# Patient Record
Sex: Male | Born: 1955
Health system: Southern US, Community
[De-identification: ages and names within clinical notes are randomized; demographics above are authoritative.]

## PROBLEM LIST (undated history)

## (undated) DIAGNOSIS — I10 Essential (primary) hypertension: Secondary | ICD-10-CM

## (undated) DIAGNOSIS — K219 Gastro-esophageal reflux disease without esophagitis: Secondary | ICD-10-CM

## (undated) DIAGNOSIS — C801 Malignant (primary) neoplasm, unspecified: Secondary | ICD-10-CM

## (undated) DIAGNOSIS — G43909 Migraine, unspecified, not intractable, without status migrainosus: Secondary | ICD-10-CM

## (undated) DIAGNOSIS — E785 Hyperlipidemia, unspecified: Secondary | ICD-10-CM

## (undated) HISTORY — DX: Gastro-esophageal reflux disease without esophagitis: K21.9

## (undated) HISTORY — PX: MOLE REMOVAL: SHX2046

## (undated) HISTORY — DX: Essential (primary) hypertension: I10

## (undated) HISTORY — DX: Migraine, unspecified, not intractable, without status migrainosus: G43.909

## (undated) HISTORY — DX: Hyperlipidemia, unspecified: E78.5

---

## 1999-09-21 ENCOUNTER — Inpatient Hospital Stay: Admission: EM | Admit: 1999-09-21 | Discharge: 1999-09-23 | Payer: Self-pay | Admitting: Emergency Medicine

## 1999-09-29 ENCOUNTER — Encounter: Admission: RE | Admit: 1999-09-29 | Discharge: 1999-09-29 | Payer: Self-pay | Admitting: Thoracic Surgery

## 1999-09-29 ENCOUNTER — Encounter: Payer: Self-pay | Admitting: Thoracic Surgery

## 1999-10-09 ENCOUNTER — Ambulatory Visit (HOSPITAL_COMMUNITY): Admission: RE | Admit: 1999-10-09 | Discharge: 1999-10-09 | Payer: Self-pay | Admitting: Psychiatry

## 1999-10-28 ENCOUNTER — Encounter: Admission: RE | Admit: 1999-10-28 | Discharge: 1999-10-28 | Payer: Self-pay | Admitting: Thoracic Surgery

## 1999-10-28 ENCOUNTER — Encounter: Payer: Self-pay | Admitting: Thoracic Surgery

## 1999-12-26 ENCOUNTER — Encounter: Admission: RE | Admit: 1999-12-26 | Discharge: 1999-12-26 | Payer: Self-pay | Admitting: Thoracic Surgery

## 1999-12-26 ENCOUNTER — Encounter: Payer: Self-pay | Admitting: Thoracic Surgery

## 2003-03-06 ENCOUNTER — Encounter: Payer: Self-pay | Admitting: Emergency Medicine

## 2003-03-06 ENCOUNTER — Emergency Department (HOSPITAL_COMMUNITY): Admission: EM | Admit: 2003-03-06 | Discharge: 2003-03-07 | Payer: Self-pay | Admitting: Emergency Medicine

## 2007-12-01 DIAGNOSIS — C61 Malignant neoplasm of prostate: Secondary | ICD-10-CM

## 2007-12-01 HISTORY — DX: Malignant neoplasm of prostate: C61

## 2008-11-30 HISTORY — PX: PROSTATECTOMY: SHX69

## 2008-12-19 ENCOUNTER — Encounter (INDEPENDENT_AMBULATORY_CARE_PROVIDER_SITE_OTHER): Payer: Self-pay | Admitting: Urology

## 2008-12-19 ENCOUNTER — Inpatient Hospital Stay (HOSPITAL_COMMUNITY): Admission: RE | Admit: 2008-12-19 | Discharge: 2008-12-20 | Payer: Self-pay | Admitting: Urology

## 2009-05-13 ENCOUNTER — Emergency Department (HOSPITAL_COMMUNITY): Admission: EM | Admit: 2009-05-13 | Discharge: 2009-05-13 | Payer: Self-pay | Admitting: Family Medicine

## 2009-05-18 ENCOUNTER — Emergency Department (HOSPITAL_COMMUNITY): Admission: EM | Admit: 2009-05-18 | Discharge: 2009-05-18 | Payer: Self-pay | Admitting: Family Medicine

## 2009-06-18 ENCOUNTER — Emergency Department (HOSPITAL_COMMUNITY): Admission: EM | Admit: 2009-06-18 | Discharge: 2009-06-18 | Payer: Self-pay | Admitting: Family Medicine

## 2009-10-14 ENCOUNTER — Ambulatory Visit: Payer: Self-pay | Admitting: Occupational Medicine

## 2010-12-30 NOTE — Letter (Signed)
Summary: Out of Work  [facility]  [address]   [place], [place] [address]   Phone: [phone]  Fax: [phone]    October 14, 2009   Employee:  [employer]    To Whom It May Concern:   For Medical reasons, please excuse the above named employee from work for the following dates:  Start:   October 14, 2009  End:   October 16, 2009  If you need additional information, please feel free to contact our office.         Sincerely,    Kathrine Haddock MD

## 2010-12-30 NOTE — Assessment & Plan Note (Signed)
Summary: Fever, headache, cough-dry x 1 day rm 3   Vital Signs:  Patient Profile:   55 Years Old Male CC:      Cold & URI symptoms Height:     72 inches Weight:      191 pounds O2 Sat:      100 % O2 treatment:    Room Air Temp:     99.4 degrees F oral Pulse rate:   114 / minute Pulse rhythm:   regular Resp:     16 per minute BP sitting:   143 / 82  (right arm) Cuff size:   regular  Vitals Entered By: Areta Haber CMA (October 14, 2009 4:50 PM)                  Current Allergies: No known allergies History of Present Illness Chief Complaint: Cold & URI symptoms History of Present Illness: Yesterday he started feeling "achy" all over and today had a fever of 102.   Some associated nausea.  Vomited once.   Complains of dry hacking cough and headache.   No reports of rash.   No abdominal pain.   No sinus congestion or pressure.   No ear pain or sore throat.   He did get the flu shot several weeks ago.  He babysat his grandkids a week ago and they were sick at the time.   Current Problems: INFLUENZA (ICD-487.8) FAMILY HISTORY DIABETES 1ST DEGREE RELATIVE (ICD-V18.0)   Current Meds ASPIRIN 81 MG CHEW (ASPIRIN) 1 tab by mouth once daily CHERATUSSIN AC 100-10 MG/5ML SYRP (GUAIFENESIN-CODEINE) 5 ml every 8 hours as needed for cough  REVIEW OF SYSTEMS Constitutional Symptoms       Complains of fever and fatigue.     Denies chills, night sweats, weight loss, and weight gain.  Eyes       Denies change in vision, eye pain, eye discharge, glasses, contact lenses, and eye surgery. Ear/Nose/Throat/Mouth       Denies hearing loss/aids, change in hearing, ear pain, ear discharge, dizziness, frequent runny nose, frequent nose bleeds, sinus problems, sore throat, hoarseness, and tooth pain or bleeding.  Respiratory       Complains of dry cough.      Denies productive cough, wheezing, shortness of breath, asthma, bronchitis, and emphysema/COPD.  Cardiovascular       Denies murmurs,  chest pain, and tires easily with exhertion.    Gastrointestinal       Complains of nausea/vomiting.      Denies stomach pain, diarrhea, constipation, blood in bowel movements, and indigestion. Genitourniary       Denies painful urination, kidney stones, and loss of urinary control. Neurological       Complains of headaches.      Denies paralysis, seizures, and fainting/blackouts. Musculoskeletal       Complains of muscle pain.      Denies joint pain, joint stiffness, decreased range of motion, redness, swelling, muscle weakness, and gout.  Skin       Denies bruising, unusual mles/lumps or sores, and hair/skin or nail changes.  Psych       Denies mood changes, temper/anger issues, anxiety/stress, speech problems, depression, and sleep problems. Other Comments: x 1 day   Past History:  Past Medical History: Unremarkable  Past Surgical History: Prostatectomy  Family History: Family History Diabetes 1st degree relative Family History of Cardiovascular disorder Emphysema Prostate Cancer  Social History: Married Never Smoked Alcohol use-no Drug use-no Regular exercise-yes Smoking Status:  never Drug Use:  no Does Patient Exercise:  yes Physical Exam General appearance: well developed, well nourished, no acute distress Eyes: conjunctivae and lids normal Pupils: equal, round, reactive to light Ears: normal, no lesions or deformities Nasal: mucosa pink, nonedematous, no septal deviation, turbinates normal Oral/Pharynx: tongue normal, posterior pharynx without erythema or exudate Neck: neck supple,  trachea midline, no masses Chest/Lungs: no rales, wheezes, or rhonchi bilateral, breath sounds equal without effort Heart: regular rate and  rhythm, no murmur Assessment New Problems: INFLUENZA (ICD-487.8) FAMILY HISTORY DIABETES 1ST DEGREE RELATIVE (ICD-V18.0)   Plan New Medications/Changes: CHERATUSSIN AC 100-10 MG/5ML SYRP (GUAIFENESIN-CODEINE) 5 ml every 8 hours as  needed for cough  #4 oz x 0, 10/14/2009, Kathrine Haddock MD  New Orders: New Patient Level II [phone] Planning Comments:   supportive care ibuprofen 600 TId Cheratussin at night for cough Off until Weds.   The patient and/or caregiver has been counseled thoroughly with regard to medications prescribed including dosage, schedule, interactions, rationale for use, and possible side effects and they verbalize understanding.  Diagnoses and expected course of recovery discussed and will return if not improved as expected or if the condition worsens. Patient and/or caregiver verbalized understanding.  Prescriptions: CHERATUSSIN AC 100-10 MG/5ML SYRP (GUAIFENESIN-CODEINE) 5 ml every 8 hours as needed for cough  #4 oz x 0   Entered and Authorized by:   Kathrine Haddock MD   Signed by:   Kathrine Haddock MD on 10/14/2009   Method used:   Print then Give to Patient   RxID:   [id number]

## 2011-03-16 LAB — CBC
HCT: 43.2 % (ref 39.0–52.0)
HCT: 36.9 % — ABNORMAL LOW (ref 39.0–52.0)
Hemoglobin: 14.6 g/dL (ref 13.0–17.0)
Hemoglobin: 12.5 g/dL — ABNORMAL LOW (ref 13.0–17.0)
MCHC: 33.8 g/dL (ref 30.0–36.0)
MCHC: 33.8 g/dL (ref 30.0–36.0)
MCV: 84.7 fL (ref 78.0–100.0)
MCV: 85.4 fL (ref 78.0–100.0)
Platelets: 192 10*3/uL (ref 150–400)
Platelets: 184 10*3/uL (ref 150–400)
RBC: 5.1 MIL/uL (ref 4.22–5.81)
RBC: 4.33 MIL/uL (ref 4.22–5.81)
RDW: 13.2 % (ref 11.5–15.5)
RDW: 13 % (ref 11.5–15.5)
WBC: 6.5 10*3/uL (ref 4.0–10.5)
WBC: 13 10*3/uL — ABNORMAL HIGH (ref 4.0–10.5)

## 2011-03-16 LAB — BASIC METABOLIC PANEL
BUN: 4 mg/dL — ABNORMAL LOW (ref 6–23)
CO2: 28 mEq/L (ref 19–32)
Calcium: 8.4 mg/dL (ref 8.4–10.5)
Chloride: 108 mEq/L (ref 96–112)
Creatinine, Ser: 0.94 mg/dL (ref 0.4–1.5)
GFR calc Af Amer: >60 mL/min (ref >60)
GFR calc non Af Amer: >60 mL/min (ref >60)
Glucose, Bld: 148 mg/dL — ABNORMAL HIGH (ref 70–99)
Potassium: 4.3 mEq/L (ref 3.5–5.1)
Sodium: 139 mEq/L (ref 135–145)

## 2011-03-16 LAB — DIFFERENTIAL
Basophils Absolute: 0.1 10*3/uL (ref 0.0–0.1)
Basophils Relative: 1 % (ref 0–1)
Eosinophils Absolute: 0 10*3/uL (ref 0.0–0.7)
Eosinophils Relative: 0 % (ref 0–5)
Lymphocytes Relative: 10 % — ABNORMAL LOW (ref 12–46)
Lymphs Abs: 1.2 10*3/uL (ref 0.7–4.0)
Monocytes Absolute: 0.9 10*3/uL (ref 0.1–1.0)
Monocytes Relative: 7 % (ref 3–12)
Neutro Abs: 10.7 10*3/uL — ABNORMAL HIGH (ref 1.7–7.7)
Neutrophils Relative %: 83 % — ABNORMAL HIGH (ref 43–77)

## 2011-03-16 LAB — TYPE AND SCREEN
ABO/RH(D): B POS
Antibody Screen: NEGATIVE

## 2011-03-16 LAB — ABO/RH: ABO/RH(D): B POS

## 2011-03-26 ENCOUNTER — Other Ambulatory Visit: Payer: Self-pay | Admitting: Dermatology

## 2011-04-14 NOTE — Op Note (Signed)
NAMEGREGORI, ABRIL              ACCOUNT NO.:  [id number]   MEDICAL RECORD NO.:  [id number]          PATIENT TYPE:  INP   LOCATION:  1406                         FACILITY:  [facility]   PHYSICIAN:  Valetta Fuller, M.D.  DATE OF BIRTH:  1956/03/12   DATE OF PROCEDURE:  12/19/2008  DATE OF DISCHARGE:                               OPERATIVE REPORT   PREOPERATIVE DIAGNOSIS:  Favorable clinical stage T1C adenocarcinoma of  the prostate.   POSTOPERATIVE DIAGNOSIS:  Favorable clinical stage T1C adenocarcinoma of  the prostate.   PROCEDURE PERFORMED:  Robotic-assisted laparoscopic radical retropubic  prostatectomy.   SURGEON:  Valetta Fuller, M.D.   ASSISTANT:  Delia Chimes, NP.   ANESTHESIA:  General endotracheal.   INDICATIONS:  Mr. Mancha is a 55 year old male.  He was sent to see me  several months ago through the courtesy of Dr. Ezzie Dural for  consideration of robotic prostatectomy to manage his recently diagnosed  adenocarcinoma of the prostate.  The patient had a normal PSA of 1.6  with some mild prostatic induration.  Ultrasound showed a small prostate  of approximately 10-15 gm.  Pathology revealed Gleason 6 adenocarcinoma  in 3/12 cores.  The patient's preoperative sexual functioning was fairly  normal with a very low AUA symptom score.  The patient underwent  extensive counseling by Dr. Brunilda Payor as well as myself.  He was interested  in a surgical approach and chose a robotic approach to managing his  prostate cancer.  He appears to understand the advantages,  disadvantages, and potential complications to surgery at this time.  He  presents now for the procedure.  The patient has tended preoperative  classes, has had physical therapy consultation.  He has received  perioperative antibiotics and placement of compression boots along with  a mechanical bowel prep.   OPERATIVE TECHNIQUE/FINDINGS:  Patient was brought to the operating  room.  The proper patient identification  was performed as well as  surgical time-out.  The patient had successful induction of general  endotracheal anesthesia.  He was then placed in a mid lithotomy position  with all extremities carefully padded.  The patient was then placed in a  steep Trendelenburg position and prepped and draped in the usual manner.  A Foley catheter was then inserted sterilely on the field.   The patient's initial camera port incision was chosen, 18 cm above the  pubic symphysis just to the left of the umbilicus.  A standard open  Hasson technique was utilized.  No evidence of abdominal adhesions and  abdominal insufflation with the trocar placement was uneventful.  Although the trocars were placed with direct visual guidance, including  three 8-mm robotic trocars and a 12-mm and a 5-mm assist trocar.  Once  trocars were in position, the surgical cart was docked.  The bladder was  filled to help identify the retropubic space, which was then opened with  electrocautery scissors.  Proper landmarks were identified.  The bladder  was then decompressed.  Superficial fat over the bladder neck and  endopelvic fascia was then removed.  The endopelvic fascia was then  widely incised.  Levator musculature was swept off the apex of the  prostate, isolating that region, and the dorsal vein was then stapled  with an ETS stapling device.  Anterior bladder neck was identified with  the aid of the Foley balloon and transected, utilizing hot  electrocautery scissors.  Catheter was found in the midline and  retracted anteriorly.  Posterior bladder neck was then transected.  Seminal vesicles and vas deferens were found bilaterally, which were  individually dissected free.  The space between the prostate and rectum  was then easily established, primarily with blunt dissection technique.   Attention was then turned towards nerve spare.  Bilaterally, the lateral  fascia was superficially incised and then swept down  posteriorly.  The  plane between the lateral posterior edge of the prostate and  neurovascular bundles were then established and then swept from base to  apex, fully isolating the neurovascular bundles.  The prostate was then  retracted anteriorly, isolating the pedicles, which were taken down with  a series of hematoclips.  The urethra was then transected.  The  prostatic specimen was then removed from the pelvis, which was then  copiously irrigated.  Blood loss was around 100 to 150 cc.  There was no  evidence of rectal injury.   Attention was then turned to reconstruction.  We did not feel lymph node  dissection was necessary in this case.  The posterior bladder neck and  posterior urethra were reapproximated with 1 interrupted 2-0 Vicryl  suture.  The rest of the anastomosis was done with a double-arm 3-0  Monocryl suture in a 360-degree running manner.  A new Coude Foley  catheter was placed, and irrigation revealed the anastomosis to be water-  tight with no evidence of extravasation or leakage.  A pelvic drain was  placed through one of the robotic trocars and sutured to the skin.  The  prostate specimen was placed in the Endopouch retrieval bag.  The 12-mm  trocar site was closed with a Vicryl suture with the aid of a suture  passer.  All of the trocars were taken out with visual guidance.  The  camera port incision was incised slightly to remove the specimen and  then closed with a running #1 Vicryl suture.  All incisions were  infiltrated with Marcaine.  Skin was closed with clips.  The patient  appeared to tolerate the procedure well.  Had no obvious complications  or problems.      Valetta Fuller, M.D.  Electronically Signed     DSG/MEDQ  D:  12/20/2008  T:  12/20/2008  Job:  [id number]

## 2011-06-18 ENCOUNTER — Other Ambulatory Visit (HOSPITAL_COMMUNITY): Payer: Self-pay | Admitting: Family Medicine

## 2011-06-18 DIAGNOSIS — R519 Headache, unspecified: Secondary | ICD-10-CM

## 2011-06-22 ENCOUNTER — Ambulatory Visit (HOSPITAL_COMMUNITY)
Admission: RE | Admit: 2011-06-22 | Discharge: 2011-06-22 | Disposition: A | Discharge status: Home / Self Care | Payer: 59 | Source: Ambulatory Visit | Attending: Family Medicine | Admitting: Family Medicine

## 2011-06-22 ENCOUNTER — Other Ambulatory Visit (HOSPITAL_COMMUNITY): Payer: Self-pay | Admitting: Family Medicine

## 2011-06-22 DIAGNOSIS — J3489 Other specified disorders of nose and nasal sinuses: Secondary | ICD-10-CM | POA: Insufficient documentation

## 2011-06-22 DIAGNOSIS — R519 Headache, unspecified: Secondary | ICD-10-CM

## 2011-06-22 DIAGNOSIS — R51 Headache: Secondary | ICD-10-CM | POA: Insufficient documentation

## 2011-10-18 ENCOUNTER — Emergency Department
Admission: EM | Admit: 2011-10-18 | Discharge: 2011-10-18 | Disposition: A | Discharge status: Home / Self Care | Payer: 59 | Source: Home / Self Care | Attending: Emergency Medicine | Admitting: Emergency Medicine

## 2011-10-18 DIAGNOSIS — J069 Acute upper respiratory infection, unspecified: Secondary | ICD-10-CM

## 2011-10-18 DIAGNOSIS — R059 Cough, unspecified: Secondary | ICD-10-CM

## 2011-10-18 DIAGNOSIS — R509 Fever, unspecified: Secondary | ICD-10-CM

## 2011-10-18 DIAGNOSIS — R05 Cough: Secondary | ICD-10-CM

## 2011-10-18 HISTORY — DX: Malignant (primary) neoplasm, unspecified: C80.1

## 2011-10-18 LAB — POCT RAPID STREP A (OFFICE): Rapid Strep A Screen: NEGATIVE

## 2011-10-18 MED ORDER — AZITHROMYCIN 250 MG PO TABS: ORAL_TABLET | ORAL | Status: AC

## 2011-10-18 MED ORDER — HYDROCODONE-HOMATROPINE 5-1.5 MG/5ML PO SYRP: 5.0000 mL | ORAL_SOLUTION | Freq: Four times a day (QID) | ORAL | Status: AC | PRN

## 2011-10-18 NOTE — ED Provider Notes (Addendum)
History     CSN: [id number] Arrival date & time: 10/18/2011  4:52 PM   First MD Initiated Contact with Patient 10/18/11 1709      Chief Complaint  Patient presents with  . Fever  . Generalized Body Aches    (Consider location/radiation/quality/duration/timing/severity/associated sxs/prior treatment) HPI Benjamin Robbins is a 55 y.o. male who complains of onset of cold symptoms for 2-3 days.  he is using Ibuprofen which helps a little bit. No sore throat + cough No wheezing No nasal congestion No post-nasal drainage No sinus pain/pressure No chest congestion No itchy/red eyes No earache No hemoptysis No SOB + chills/sweats + fever No nausea No vomiting No abdominal pain No diarrhea No skin rashes + fatigue + myalgias + headache    Past Medical History  Diagnosis Date  . Cancer - prostate     Past Surgical History  Procedure Date  . Prostate surgery 2010    Family History  Problem Relation Age of Onset  . Cancer Mother   . Emphysema Father     History  Substance Use Topics  . Smoking status: Never Smoker   . Smokeless tobacco: Not on file  . Alcohol Use: No      Review of Systems  Allergies  Review of patient's allergies indicates no known allergies.  Home Medications  No current outpatient prescriptions on file.  BP 158/90  Pulse 120  Temp(Src) 102.8 F (39.3 C) (Oral)  Resp 24  Ht 6' (1.829 m)  Wt 173 lb 8 oz (78.699 kg)  BMI 23.53 kg/m2  SpO2 99%  Physical Exam  Nursing note and vitals reviewed. Constitutional: He is oriented to person, place, and time. He appears well-developed and well-nourished.  HENT:  Head: Normocephalic and atraumatic.  Right Ear: Tympanic membrane, external ear and ear canal normal.  Left Ear: Tympanic membrane, external ear and ear canal normal.  Nose: Mucosal edema and rhinorrhea present.  Mouth/Throat: Posterior oropharyngeal erythema present. No oropharyngeal exudate or posterior oropharyngeal edema.  Neck:  Neck supple.  Cardiovascular: Regular rhythm and normal heart sounds.   Pulmonary/Chest: Effort normal and breath sounds normal. No respiratory distress.  Neurological: He is alert and oriented to person, place, and time.  Skin: Skin is warm and dry.  Psychiatric: He has a normal mood and affect. His speech is normal.    ED Course  Procedures (including critical care time)  Labs Reviewed - No data to display No results found.   No diagnosis found.    MDM   1)  Take the prescribed antibiotic as instructed.  Rapid strep negative.  Consider CXR if not improving to rule out pneumonia. 2)  Use nasal saline solution (over the counter) at least 3 times a day. 3)  Use over the counter decongestants like Zyrtec-D every 12 hours as needed to help with congestion.  If you have hypertension, do not take medicines with sudafed.  4)  Can take tylenol every 6 hours or motrin every 8 hours for pain or fever. 5)  Follow up with your primary doctor if no improvement in 5-7 days, sooner if increasing pain, fever, or new symptoms.     Lily Kocher, MD 10/18/11 1711  Lily Kocher, MD 10/18/11 [phone]

## 2011-10-18 NOTE — ED Notes (Signed)
Fever and HA- states he is unable to lay on left side d/t pressure in chest

## 2011-10-27 ENCOUNTER — Emergency Department
Admission: EM | Admit: 2011-10-27 | Discharge: 2011-10-27 | Disposition: A | Discharge status: Home / Self Care | Payer: 59 | Source: Home / Self Care | Attending: Emergency Medicine | Admitting: Emergency Medicine

## 2011-10-27 ENCOUNTER — Encounter: Payer: Self-pay | Admitting: *Deleted

## 2011-10-27 DIAGNOSIS — J069 Acute upper respiratory infection, unspecified: Secondary | ICD-10-CM

## 2011-10-27 DIAGNOSIS — M545 Low back pain, unspecified: Secondary | ICD-10-CM

## 2011-10-27 MED ORDER — AZITHROMYCIN 250 MG PO TABS: ORAL_TABLET | ORAL | Status: AC

## 2011-10-27 MED ORDER — PREDNISONE (PAK) 10 MG PO TABS: 10.0000 mg | ORAL_TABLET | Freq: Every day | ORAL | Status: AC

## 2011-10-27 NOTE — ED Provider Notes (Signed)
History     CSN: [id number] Arrival date & time: 10/27/2011 11:20 AM   First MD Initiated Contact with Patient 10/18/11 1709      Chief Complaint  Patient presents with  . Generalized Body Aches    (Consider location/radiation/quality/duration/timing/severity/associated sxs/prior treatment) HPI  this is a 55 year old male who comes back today for similar symptoms as last visit. Please see his previous note which is underneath this one. He did improve substantially on a Z-Pak and feels much better today with much less cough, however he reports over the last day or 2 he has been feeling a little bit febrile with an achy back. He does want to make sure that he is not to get worse again.  Alek is a 55 y.o. male who complains of onset of cold symptoms for 2-3 days.  he is using Ibuprofen which helps a little bit. No sore throat + cough No wheezing No nasal congestion No post-nasal drainage No sinus pain/pressure No chest congestion No itchy/red eyes No earache No hemoptysis No SOB + chills/sweats + fever No nausea No vomiting No abdominal pain No diarrhea No skin rashes + fatigue + myalgias + headache    Past Medical History  Diagnosis Date  . Cancer - prostate     Past Surgical History  Procedure Date  . Prostate surgery 2010    Family History  Problem Relation Age of Onset  . Cancer Mother   . Emphysema Father     History  Substance Use Topics  . Smoking status: Never Smoker   . Smokeless tobacco: Not on file  . Alcohol Use: No      Review of Systems   Allergies  Review of patient's allergies indicates no known allergies.  Home Medications   Current Outpatient Rx  Name Route Sig Dispense Refill  . AZITHROMYCIN 250 MG PO TABS  Use as directed 1 each 0  . HYDROCODONE-HOMATROPINE 5-1.5 MG/5ML PO SYRP Oral Take 5 mLs by mouth every 6 (six) hours as needed for cough. 120 mL 0  . PREDNISONE (PAK) 10 MG PO TABS Oral Take 1 tablet (10 mg total) by  mouth daily. 6 day pack, use as directed 1 tablet 0    BP 144/84  Pulse 90  Temp(Src) 98.5 F (36.9 C) (Oral)  Resp 16  Ht 6' (1.829 m)  Wt 170 lb (77.111 kg)  BMI 23.06 kg/m2  SpO2 100%  Physical Exam  Nursing note and vitals reviewed. Constitutional: He is oriented to person, place, and time. He appears well-developed and well-nourished.  HENT:  Head: Normocephalic and atraumatic.  Right Ear: Tympanic membrane, external ear and ear canal normal.  Left Ear: Tympanic membrane, external ear and ear canal normal.  Nose: Mucosal edema and rhinorrhea present.  Mouth/Throat: Posterior oropharyngeal erythema present. No oropharyngeal exudate or posterior oropharyngeal edema.  Neck: Neck supple.  Cardiovascular: Regular rhythm and normal heart sounds.   Pulmonary/Chest: Effort normal and breath sounds normal. No respiratory distress.  Musculoskeletal:       Lumbar back: He exhibits pain and spasm. He exhibits normal range of motion and no bony tenderness.  Neurological: He is alert and oriented to person, place, and time.  Skin: Skin is warm and dry.  Psychiatric: He has a normal mood and affect. His speech is normal.    ED Course  Procedures  (including critical care time)  Labs Reviewed - No data to display No results found.   1. Acute upper respiratory infections of unspecified  site   2. Pain in lower back      A MDM  As per the previous note, I believe this is still viral. The lung exam still normal and do not believe a chest x-ray is appropriate at this time. However with the lower back pain and spasms along with the continued upper respiratory symptoms, I believe that the prednisone pack would best suit his symptoms. I have also given him another prescription for Z-Pak just in case he is not improved in the next few days. The patient agrees and understands his instructions. See underneath for his previous patient instructions.   1)  Take the prescribed antibiotic as  instructed.  Rapid strep negative.  Consider CXR if not improving to rule out pneumonia. 2)  Use nasal saline solution (over the counter) at least 3 times a day. 3)  Use over the counter decongestants like Zyrtec-D every 12 hours as needed to help with congestion.  If you have hypertension, do not take medicines with sudafed.  4)  Can take tylenol every 6 hours or motrin every 8 hours for pain or fever. 5)  Follow up with your primary doctor if no improvement in 5-7 days, sooner if increasing pain, fever, or new symptoms.     Lily Kocher, MD 10/18/11 1711   Lily Kocher, MD 10/27/11 1149

## 2011-10-27 NOTE — ED Notes (Signed)
Patient was treated here in [facility] about 1 week ago. He finished the Z-pak and improved. He developed fever and body aches again about 2 days ago.

## 2012-11-30 HISTORY — PX: COLONOSCOPY: SHX174

## 2012-12-01 ENCOUNTER — Encounter: Payer: Self-pay | Admitting: Gastroenterology

## 2012-12-16 ENCOUNTER — Ambulatory Visit: Payer: 59 | Admitting: Gastroenterology

## 2012-12-22 ENCOUNTER — Encounter: Payer: Self-pay | Admitting: Gastroenterology

## 2012-12-22 ENCOUNTER — Ambulatory Visit (INDEPENDENT_AMBULATORY_CARE_PROVIDER_SITE_OTHER): Payer: 59 | Admitting: Gastroenterology

## 2012-12-22 ENCOUNTER — Other Ambulatory Visit (INDEPENDENT_AMBULATORY_CARE_PROVIDER_SITE_OTHER): Payer: 59

## 2012-12-22 VITALS — BP 158/82 | HR 76 | Ht 72.0 in | Wt 166.0 lb

## 2012-12-22 DIAGNOSIS — K219 Gastro-esophageal reflux disease without esophagitis: Secondary | ICD-10-CM

## 2012-12-22 DIAGNOSIS — R1032 Left lower quadrant pain: Secondary | ICD-10-CM

## 2012-12-22 DIAGNOSIS — K529 Noninfective gastroenteritis and colitis, unspecified: Secondary | ICD-10-CM

## 2012-12-22 DIAGNOSIS — R197 Diarrhea, unspecified: Secondary | ICD-10-CM

## 2012-12-22 LAB — SEDIMENTATION RATE: Sed Rate: 6 mm/hr (ref 0–22)

## 2012-12-22 LAB — C-REACTIVE PROTEIN: CRP: 0.6 mg/dL (ref 0.5–20.0)

## 2012-12-22 MED ORDER — MESALAMINE 1.2 G PO TBEC: 1.2000 g | DELAYED_RELEASE_TABLET | Freq: Two times a day (BID) | ORAL | Status: DC

## 2012-12-22 MED ORDER — MESALAMINE 1.2 G PO TBEC: 1.2000 g | DELAYED_RELEASE_TABLET | Freq: Every day | ORAL | Status: DC

## 2012-12-22 MED ORDER — MOVIPREP 100 G PO SOLR: 1.0000 | Freq: Once | ORAL | Status: DC

## 2012-12-22 NOTE — Patient Instructions (Addendum)
You have been scheduled for a colonoscopy and endoscopy with propofol. Please follow written instructions given to you at your visit today.  Please pick up your prep kit at the pharmacy within the next 1-3 days. If you use inhalers (even only as needed) or a CPAP machine, please bring them with you on the day of your procedure.  Your physician has requested that you go to the basement for lab work before leaving today  Please start Lialda one tablet by mouth once daily. Samples given today.  Low Fiber Diet information is below  ______________________________________________________________________________________________________________________  Low-Fiber Diet Fiber is found in fruits, vegetables, and grains. A low-fiber diet restricts fibrous foods that are not digested in the small intestine. A diet containing about 10 grams of fiber is considered low fiber.  PURPOSE  To prevent blockage of a partially obstructed or narrowed gastrointestinal tract.  To reduce fecal weight and volume.  To slow the movement of feces. WHEN IS THIS DIET USED?  It may be used during the acute phase of Crohn disease, ulcerative colitis, regional enteritis, or diverticulitis.  It may be used if your intestinal or esophageal tubes are narrowing (stenosis).  It may be used as a transitional diet following surgery, injury (trauma), or illness. CHOOSING FOODS Check labels, especially on foods from the starch list. Often times, dietary fiber content is listed on the nutrition facts panel. Please ask your Registered Dietitian if you have questions about specific foods that are related to your condition, especially if the food is not listed on this handout. Breads and Starches  Allowed: White, [place], and pita breads, plain rolls, buns, or sweet rolls, doughnuts, waffles, pancakes, bagels. Plain muffins, biscuits, matzoth. Soda, saltine, graham crackers. Pretzels, rusks, melba toast, zwieback. Cooked cereals:  cornmeal, farina, or cream cereals. Dry cereals: refined corn, wheat, rice, and oat cereals (check label). Potatoes prepared any way without skins, refined macaroni, spaghetti, noodles, refined rice.  Avoid: Whole-wheat bread, rolls, and crackers. Multigrains, rye, bran seeds, nuts, or coconut. Cereals containing whole grains, multigrains, bran, coconut, nuts, raisins. Cooked or dry oatmeal. Coarse wheat cereals, granola. Cereals advertised as "high fiber." Potato skins. Whole-grain pasta, wild or brown rice. Popcorn. Vegetables  Allowed: Strained tomato and vegetable juices. Fresh lettuce, cucumber, spinach. Well-cooked or canned: asparagus, bean sprouts, broccoli, cut green beans, cauliflower, pumpkin, beets, mushrooms, olives, yellow squash, tomato, tomato sauce, zucchini, turnips.Keep servings limited to  cup.  Avoid: Fresh, cooked, or canned: artichokes, baked beans, beet greens, Brussels sprouts, corn, kale, legumes, peas, sweet potatoes. Avoid large servings of any vegetables. Fruit  Allowed: All fruit juices except prune juice. Cooked or canned fruits without skin and seeds: apricots, applesauce, cantaloupe, cherries, grapefruit, grapes, kiwi, mandarin oranges, peaches, pears, fruit cocktail, pineapple, plums, watermelon. Fresh without skin: banana, grapes, cantaloupe, avocado, cherries, pineapple, kiwi, nectarines, peaches, blueberries. Keep servings limited to  cup or 1 piece.  Avoid: Fresh: apples with or without skin, apricots, mangoes, pears, raspberries, strawberries. Prune juice and juices with pulp, stewed or dried prunes. Dried fruits, raisins, dates. Avoid large servings of all fresh fruits. Meat and Protein Substitutes  Allowed: Ground or well-cooked tender beef, ham, veal, lamb, pork, poultry. Eggs, plain cheese. Fish, oysters, shrimp, lobster, other seafood. Liver, organ meats. Smooth nut butters.  Avoid: Tough, fibrous meats with gristle. Chunky nut butter.Cheese with  seeds, nuts, or other foods not allowed. Nuts, seeds, legumes, dried peas, beans, lentils. Dairy  Allowed: All milk products except those not allowed.  Avoid: Yogurt or  cheese that contains nuts, seeds, or added fruit. Soups and Combination Foods  Allowed: Bouillon, broth, or cream soups made from allowed foods. Any strained soup. Casseroles or mixed dishes made with allowed foods.  Avoid: Soups made from vegetables that are not allowed or that contain other foods not allowed. Desserts and Sweets  Allowed:Plain cakes and cookies, pie made with allowed fruit, pudding, custard, cream pie. Gelatin, fruit, ice, sherbet, frozen ice pops. Ice cream, ice milk without nuts. Plain hard candy, honey, jelly, molasses, syrup, sugar, chocolate syrup, gumdrops, marshmallows.  Avoid: Desserts, cookies, or candies that contain nuts, peanut butter, dried fruits. Jams, preserves with seeds, marmalade. Fats and Oils  Allowed:Margarine, butter, cream, mayonnaise, salad oils, plain salad dressings made from allowed foods.  Avoid: Seeds, nuts, olives. Beverages  Allowed: All, except those listed to avoid.  Avoid: Fruit juices with high pulp, prune juice. Condiments  Allowed:Ketchup, mustard, horseradish, vinegar, cream sauce, cheese sauce, cocoa powder. Spices in moderation: allspice, basil, bay leaves, celery powder or leaves, cinnamon, cumin powder, curry powder, ginger, mace, marjoram, onion or garlic powder, oregano, paprika, parsley flakes, ground pepper, rosemary, sage, savory, tarragon, thyme, turmeric.  Avoid: Coconut, pickles. SAMPLE MENU Breakfast   cup orange juice.  1 boiled egg.  1 slice white toast.  Margarine.   cup cornflakes.  1 cup milk.  Beverage. Lunch   cup chicken noodle soup.  2 to 3 oz sliced roast beef.  2 slices white bread.  Mayonnaise.   cup tomato juice.  1 small banana.  Beverage. Dinner  3 oz baked chicken.   cup scalloped  potatoes.   cup cooked beets.  White dinner roll.  Margarine.   cup canned peaches.  Beverage. Document Released: 05/08/2002 Document Revised: 02/08/2012 Document Reviewed: 12/03/2011 [facility] Patient Information 2013 [place], [place].

## 2012-12-22 NOTE — Progress Notes (Signed)
History of Present Illness:  This is a 57 year old Caucasian male who has had 3 months of left lower quadrant discomfort and soft liquidy stools without melena or hematochezia.  He is been worked up by Dr. Duane Lope and had negative stool exam for Giardia and routine culture.  Patient is been on Nexium for several years because of GERD, discontinued this medication with no improvement in his frequent  stooling.  Denies nausea vomiting, upper GI or hepatobiliary complaints.  He does not abuse cigarettes, NSAIDs, oral nonabsorbable carbohydrates.  No associated symptoms of malabsorption, anorexia, weight loss, or systemic complaints, fever, chills, arthritis, skin rashes, or mouth sores.  Had a negative colonoscopy in 2006 by Dr. Laural Benes as a screening exam.  Family history is entirely noncontributory.  He does have a dog at home.  He denies frequent antibiotic use.  I have reviewed this patient's present history, medical and surgical past history, allergies and medications.     ROS:   All systems were reviewed and are negative unless otherwise stated in the HPI.     No Known Allergies Outpatient Prescriptions Prior to Visit  Medication Sig Dispense Refill  . aspirin 81 MG tablet Take 81 mg by mouth daily.      [name] esomeprazole (NEXIUM) 40 MG capsule Take 40 mg by mouth daily before breakfast.      . Multiple Vitamin (MULTIVITAMINS PO) Take 1 tablet by mouth daily.      [name] topiramate (TOPAMAX) 100 MG tablet Take 100 mg by mouth at bedtime.       Last reviewed on 12/22/2012  9:27 AM by Mardella Layman, MD Past Medical History  Diagnosis Date  . Cancer - prostate   . GERD (gastroesophageal reflux disease)   . Migraine    Past Surgical History  Procedure Date  . Prostatectomy 11-2008  . Mole removal     off neck   History   Social History  . Marital Status: Married    Spouse Name: [name]    Number of Children: 3  . Years of Education: [name]   Occupational History  .  [place]   Social  History Main Topics  . Smoking status: Never Smoker   . Smokeless tobacco: Never Used  . Alcohol Use: No  . Drug Use: No  . Sexually Active: None   Other Topics Concern  . None   Social History Narrative   Daily caffeine   Family History  Problem Relation Age of Onset  . Pancreatic cancer Mother   . Emphysema Father   . Colon cancer Neg Hx   . Colon polyps Brother   . Diabetes Mother        Physical Exam: Blood pressure 150/82, pulse 76 and regular, and weight 166 with a BMI of 22.51. General well developed well nourished patient in no acute distress, appearing their stated age Eyes PERRLA, no icterus, fundoscopic exam per opthamologist Skin no lesions noted Neck supple, no adenopathy, no thyroid enlargement, no tenderness Chest clear to percussion and auscultation Heart no significant murmurs, gallops or rubs noted Abdomen no hepatosplenomegaly masses or tenderness, BS normal.  Rectal inspection normal no fissures, or fistulae noted.  No masses or tenderness on digital exam. Stool guaiac negative. Extremities no acute joint lesions, edema, phlebitis or evidence of cellulitis. Neurologic patient oriented x 3, cranial nerves intact, no focal neurologic deficits noted. Psychological mental status normal and normal affect.  Assessment and plan: His left lower quadrant discomfort and diarrhea certainly  seem consistent with idiopathic colitis, and I have started him on Lialda 2.4 g a day.  I will perform colonoscopy, check stool for WBC, repeat O&P, C. difficile toxin.  I will also check sedimentation rate, CRP, do celiac profile.  Have asked him to stop any food products with sorbitol or other nonabsorbable carbohydrates , and advised low fiber diet.  Review of his labs shows a normal CBC and metabolic profile.  He also needs screening endoscopy for Barrett's mucosa because of his chronic acid reflux and chronic PPI therapy.  We'll obtain small bowel biopsy at the time of his upper  endoscopic exam.  His continue other medications as listed and reviewed.  Please copy his primary care physician.  No diagnosis found.

## 2012-12-23 ENCOUNTER — Telehealth: Payer: Self-pay | Admitting: Gastroenterology

## 2012-12-23 LAB — CELIAC PANEL 10
Endomysial Screen: NEGATIVE
Gliadin IgA: 7.6 U/mL (ref <20)
Gliadin IgG: 11.7 U/mL (ref <20)
IgA: 150 mg/dL (ref 68–379)
Tissue Transglut Ab: 9.7 U/mL (ref <20)
Tissue Transglutaminase Ab, IgA: 4.4 U/mL (ref <20)

## 2012-12-23 NOTE — Telephone Encounter (Signed)
Forward  14 pages from [facility] to Dr. Sheryn Bison for review on 12-23-12 ym

## 2013-01-09 ENCOUNTER — Encounter: Payer: Self-pay | Admitting: Gastroenterology

## 2013-01-09 ENCOUNTER — Ambulatory Visit (AMBULATORY_SURGERY_CENTER): Payer: 59 | Admitting: Gastroenterology

## 2013-01-09 VITALS — BP 144/85 | HR 61 | Temp 97.1°F | Resp 15 | Ht 72.0 in | Wt 166.0 lb

## 2013-01-09 DIAGNOSIS — K219 Gastro-esophageal reflux disease without esophagitis: Secondary | ICD-10-CM

## 2013-01-09 DIAGNOSIS — Z1210 Encounter for screening for malignant neoplasm of intestinal tract, unspecified: Secondary | ICD-10-CM

## 2013-01-09 DIAGNOSIS — Z1211 Encounter for screening for malignant neoplasm of colon: Secondary | ICD-10-CM

## 2013-01-09 DIAGNOSIS — D126 Benign neoplasm of colon, unspecified: Secondary | ICD-10-CM

## 2013-01-09 DIAGNOSIS — D1330 Benign neoplasm of unspecified part of small intestine: Secondary | ICD-10-CM

## 2013-01-09 DIAGNOSIS — R197 Diarrhea, unspecified: Secondary | ICD-10-CM

## 2013-01-09 DIAGNOSIS — R1032 Left lower quadrant pain: Secondary | ICD-10-CM

## 2013-01-09 MED ORDER — SODIUM CHLORIDE 0.9 % IV SOLN: 500.0000 mL | INTRAVENOUS | Status: DC

## 2013-01-09 MED ORDER — HYOSCYAMINE SULFATE 0.125 MG SL SUBL: 0.1250 mg | SUBLINGUAL_TABLET | SUBLINGUAL | Status: DC | PRN

## 2013-01-09 NOTE — Op Note (Signed)
[facility] [address] [place], [address]   COLONOSCOPY PROCEDURE REPORT  PATIENT: Benjamin Robbins, Benjamin Robbins  MR#: [id number] BIRTHDATE: 1956-05-23 , 56  yrs. old GENDER: Male ENDOSCOPIST: Mardella Layman, MD, [facility] REFERRED BY: PROCEDURE DATE:  01/09/2013 PROCEDURE:   Colonoscopy with biopsy ASA CLASS:   Class II INDICATIONS:Average risk patient for colon cancer and unexplained diarrhea. MEDICATIONS: Propofol (Diprivan) 180 mg IV  DESCRIPTION OF PROCEDURE:   After the risks and benefits and of the procedure were explained, informed consent was obtained.  A digital rectal exam revealed no abnormalities of the rectum.    The LB CF-H180AL [id number]  endoscope was introduced through the anus and advanced to the cecum, which was identified by both the appendix and ileocecal valve .  The quality of the prep was excellent, using MoviPrep .  The instrument was then slowly withdrawn as the colon was fully examined.     COLON FINDINGS: A normal appearing cecum, ileocecal valve, and appendiceal orifice were identified.  The ascending, hepatic flexure, transverse, splenic flexure, descending, sigmoid colon and rectum appeared unremarkable.  No polyps or cancers were seen. random biopsies done.   Retroflexed views revealed no abnormalities.     The scope was then withdrawn from the patient and the procedure completed.  COMPLICATIONS: There were no complications. ENDOSCOPIC IMPRESSION: Normal colon ...r/o microscopic colitis/collagenous colitis.Marland KitchenMarland KitchenNO POLYPS NOTED,  RECOMMENDATIONS: 1.  Await biopsy results 2.  Continue current colorectal screening recommendations for "routine risk" patients with a repeat colonoscopy in 10 years. 3.  Upper endoscopy will be scheduled   REPEAT EXAM:  cc:  _______________________________ eSignedMardella Layman, MD, [facility] 01/09/2013 1:52 PM

## 2013-01-09 NOTE — Patient Instructions (Addendum)

## 2013-01-09 NOTE — Progress Notes (Signed)
Called to room to assist during endoscopic procedure.  Patient ID and intended procedure confirmed with present staff. Received instructions for my participation in the procedure from the performing physician.  

## 2013-01-09 NOTE — Op Note (Signed)
[facility] [address] [place], [address]   ENDOSCOPY PROCEDURE REPORT  PATIENT: Benjamin, Robbins  MR#: [id number] BIRTHDATE: 08/02/1956 , 56  yrs. old GENDER: Male ENDOSCOPIST:David Hale Bogus, MD, [facility] REFERRED BY: Duane Lope, M.D. PROCEDURE DATE:  01/09/2013 PROCEDURE:   EGD w/ biopsy ASA CLASS:    Class II INDICATIONS: Chest pain and History of esophageal reflux. MEDICATION: There was residual sedation effect present from prior procedure, Propofol (Diprivan) 140 mg IV, and Robinul 0.2 mg IV TOPICAL ANESTHETIC:   Cetacaine Spray  DESCRIPTION OF PROCEDURE:   After the risks and benefits of the procedure were explained, informed consent was obtained.  The LB GIF-H180 [id number]  endoscope was introduced through the mouth  and advanced to the second portion of the duodenum .  The instrument was slowly withdrawn as the mucosa was fully examined.    The upper, middle and distal third of the esophagus were carefully inspected and no abnormalities were noted.  The z-line was well seen at the GEJ.  The endoscope was pushed into the fundus which was normal including a retroflexed view.  The antrum, gastric body, first and second part of the duodenum were unremarkable.  Multiple biopsies were performed.of the duodenum.    Retroflexed views revealed no abnormalities.    The scope was then withdrawn from the patient and the procedure completed  COMPLICATIONS: There were no complications.   ENDOSCOPIC IMPRESSION:  Hx. of chronic GERD,,,,r/o celiac disease... Normal EGD; multiple biopsies  RECOMMENDATIONS: 1.  Await pathology results 2.  Continue PPI    _______________________________ eSigned:  Mardella Layman, MD, [facility] 01/09/2013 2:04 PM      PATIENT NAME:  Benjamin, Robbins MR#: [id number]

## 2013-01-09 NOTE — Progress Notes (Signed)
Patient did not have preoperative order for IV antibiotic SSI prophylaxis. (G8918)  Patient did not experience any of the following events: a burn prior to discharge; a fall within the facility; wrong site/side/patient/procedure/implant event; or a hospital transfer or hospital admission upon discharge from the facility. (G8907)  

## 2013-01-10 ENCOUNTER — Telehealth: Payer: Self-pay | Admitting: *Deleted

## 2013-01-10 NOTE — Telephone Encounter (Signed)
Number identifier, left message, follow-up  

## 2013-01-11 ENCOUNTER — Telehealth: Payer: Self-pay | Admitting: Gastroenterology

## 2013-01-11 NOTE — Telephone Encounter (Signed)
Pt asked if he continues the Lialda since Dr Jarold Motto ordered levsin. Explained to pt that he could take both together; he stated understanding.

## 2013-01-19 ENCOUNTER — Encounter: Payer: Self-pay | Admitting: Gastroenterology

## 2013-01-24 ENCOUNTER — Encounter: Payer: Self-pay | Admitting: Gastroenterology

## 2013-01-24 ENCOUNTER — Other Ambulatory Visit (INDEPENDENT_AMBULATORY_CARE_PROVIDER_SITE_OTHER): Payer: 59

## 2013-01-24 ENCOUNTER — Ambulatory Visit (INDEPENDENT_AMBULATORY_CARE_PROVIDER_SITE_OTHER): Payer: 59 | Admitting: Gastroenterology

## 2013-01-24 VITALS — BP 132/80 | HR 77 | Ht 72.0 in | Wt 159.4 lb

## 2013-01-24 DIAGNOSIS — R197 Diarrhea, unspecified: Secondary | ICD-10-CM

## 2013-01-24 DIAGNOSIS — K589 Irritable bowel syndrome without diarrhea: Secondary | ICD-10-CM

## 2013-01-24 DIAGNOSIS — R109 Unspecified abdominal pain: Secondary | ICD-10-CM

## 2013-01-24 DIAGNOSIS — R634 Abnormal weight loss: Secondary | ICD-10-CM

## 2013-01-24 LAB — CBC WITH DIFFERENTIAL/PLATELET
Basophils Absolute: 0 10*3/uL (ref 0.0–0.1)
Basophils Relative: 0.4 % (ref 0.0–3.0)
Eosinophils Absolute: 0.2 10*3/uL (ref 0.0–0.7)
Eosinophils Relative: 2.7 % (ref 0.0–5.0)
HCT: 36.8 % — ABNORMAL LOW (ref 39.0–52.0)
Hemoglobin: 12.4 g/dL — ABNORMAL LOW (ref 13.0–17.0)
Lymphocytes Relative: 27.7 % (ref 12.0–46.0)
Lymphs Abs: 1.9 10*3/uL (ref 0.7–4.0)
MCHC: 33.6 g/dL (ref 30.0–36.0)
MCV: 80.6 fl (ref 78.0–100.0)
Monocytes Absolute: 0.4 10*3/uL (ref 0.1–1.0)
Monocytes Relative: 6.6 % (ref 3.0–12.0)
Neutro Abs: 4.2 10*3/uL (ref 1.4–7.7)
Neutrophils Relative %: 62.6 % (ref 43.0–77.0)
Platelets: 248 10*3/uL (ref 150.0–400.0)
RBC: 4.57 Mil/uL (ref 4.22–5.81)
RDW: 13.9 % (ref 11.5–14.6)
WBC: 6.7 10*3/uL (ref 4.5–10.5)

## 2013-01-24 LAB — COMPREHENSIVE METABOLIC PANEL
ALT: 20 U/L (ref 0–53)
AST: 19 U/L (ref 0–37)
Albumin: 4 g/dL (ref 3.5–5.2)
Alkaline Phosphatase: 66 U/L (ref 39–117)
BUN: 14 mg/dL (ref 6–23)
CO2: 24 mEq/L (ref 19–32)
Calcium: 9.1 mg/dL (ref 8.4–10.5)
Chloride: 110 mEq/L (ref 96–112)
Creatinine, Ser: 0.8 mg/dL (ref 0.4–1.5)
GFR: 114.19 mL/min (ref >60.00)
Glucose, Bld: 88 mg/dL (ref 70–99)
Potassium: 4.8 mEq/L (ref 3.5–5.1)
Sodium: 139 mEq/L (ref 135–145)
Total Bilirubin: 0.3 mg/dL (ref 0.3–1.2)
Total Protein: 6.6 g/dL (ref 6.0–8.3)

## 2013-01-24 LAB — IBC PANEL
Iron: 24 ug/dL — ABNORMAL LOW (ref 42–165)
Saturation Ratios: 5.5 % — ABNORMAL LOW (ref 20.0–50.0)
Transferrin: 311.4 mg/dL (ref 212.0–360.0)

## 2013-01-24 LAB — HEPATIC FUNCTION PANEL
ALT: 20 U/L (ref 0–53)
AST: 19 U/L (ref 0–37)
Albumin: 4 g/dL (ref 3.5–5.2)
Alkaline Phosphatase: 66 U/L (ref 39–117)
Bilirubin, Direct: 0.1 mg/dL (ref 0.0–0.3)
Total Bilirubin: 0.3 mg/dL (ref 0.3–1.2)
Total Protein: 6.6 g/dL (ref 6.0–8.3)

## 2013-01-24 LAB — SEDIMENTATION RATE: Sed Rate: 13 mm/hr (ref 0–22)

## 2013-01-24 LAB — TSH: TSH: 2.07 u[IU]/mL (ref 0.35–5.50)

## 2013-01-24 LAB — LIPASE: Lipase: 78 U/L — ABNORMAL HIGH (ref 11.0–59.0)

## 2013-01-24 LAB — FERRITIN: Ferritin: 10.9 ng/mL — ABNORMAL LOW (ref 22.0–322.0)

## 2013-01-24 LAB — VITAMIN B12: Vitamin B-12: 331 pg/mL (ref 211–911)

## 2013-01-24 LAB — FOLATE: Folate: >24.8 ng/mL (ref >5.9)

## 2013-01-24 LAB — AMYLASE: Amylase: 74 U/L (ref 27–131)

## 2013-01-24 MED ORDER — GLYCOPYRROLATE 2 MG PO TABS: 2.0000 mg | ORAL_TABLET | Freq: Three times a day (TID) | ORAL | Status: DC | PRN

## 2013-01-24 MED ORDER — GLYCOPYRROLATE 2 MG PO TABS: 2.0000 mg | ORAL_TABLET | Freq: Three times a day (TID) | ORAL | Status: DC

## 2013-01-24 NOTE — Progress Notes (Signed)
History of Present Illness: This is a 57 year old Caucasian male accompanied by his wife.  He has had 6 months of alternating diarrhea and constipation with abdominal gas and bloating, currently with constipation after initially being seen because of multiple stools.  Empiric treatment with Lialda has not made any improvement.  Recent endoscopy with small bowel biopsy, and colonoscopy with random biopsies was entirely normal.  His wife is crying during the interview today is afraid that he has "pancreatic cancer".  Apparently he did lose a large amount of weight earlier last year after being on Topamax 100 mg a day for neurologic problems.  He has been on when necessary Levsin with mild improvement.  He is not currently on Nexium therapy, denies current reflux symptoms.  The patient denies a specific food intolerances, or symptoms of chronic malabsorption otherwise, or any history of hepatitis or pancreatitis.  I do not have all his records, and actually had very little labs radiographs for review.    Current Medications, Allergies, Past Medical History, Past Surgical History, Family History and Social History were reviewed in [employer] record.  ROS: All systems were reviewed and are negative unless otherwise stated in the HPI.         Assessment and plan: Probable irritable bowel syndrome, but at the family's request and CT scan of the abdomen and pelvis, also screening labs including amylase and lipase.  Apparently there is a family history of pancreatic cancer.  I've asked this patient to follow a high fiber diet with daily Metamucil, discontinue Levsin and Nexium, and we'll try a longer acting anti-spasmodic with Robinual 2 milligrams twice a day as tolerated.  We may need in depth psychological evaluation with this patient. Encounter Diagnoses  Name Primary?  . Diarrhea Yes  . Abdominal  pain, other specified site

## 2013-01-24 NOTE — Patient Instructions (Addendum)
Stop Nexium, Lialda, and Levsin.  Start Robinul 2 mg, please take as directed. Prescription was sent to your pharmacy.  Please purchase Metamucil over the counter. Take as directed.  Your physician has requested that you go to the basement for lab work before leaving today.  High fiber diet information is below. __________________________________________________________________________________________________________________  Benjamin Robbins have been scheduled for a CT scan of the abdomen and pelvis at [facility] ([address]---this is in the same building as Architectural technologist).   You are scheduled on 01-25-2013 at 1 pm. You should arrive 15 minutes prior to your appointment time for registration. Please follow the written instructions below on the day of your exam:  WARNING: IF YOU ARE ALLERGIC TO IODINE/X-RAY DYE, PLEASE NOTIFY RADIOLOGY IMMEDIATELY AT [phone]! YOU WILL BE GIVEN A 13 HOUR PREMEDICATION PREP.  1) Do not eat or drink anything after 9 am (4 hours prior to your test) 2) You have been given 2 bottles of oral contrast to drink. The solution may taste better if refrigerated, but do NOT add ice or any other liquid to this solution. Shake well before drinking.    Drink 1 bottle of contrast @ 11 AM (2 hours prior to your exam)  Drink 1 bottle of contrast @ 12 PM (1 hour prior to your exam)  You may take any medications as prescribed with a small amount of water except for the following: Metformin, Glucophage, Glucovance, Avandamet, Riomet, Fortamet, Actoplus Met, Janumet, Glumetza or Metaglip. The above medications must be held the day of the exam AND 48 hours after the exam.  The purpose of you drinking the oral contrast is to aid in the visualization of your intestinal tract. The contrast solution may cause some diarrhea. Before your exam is started, you will be given a small amount of fluid to drink. Depending on your individual set of symptoms, you may also receive  an intravenous injection of x-ray contrast/dye. Plan on being at [facility] for 30 minutes or long, depending on the type of exam you are having performed.  This test typically takes 30-45 minutes to complete.  If you have any questions regarding your exam or if you need to reschedule, you may call the CT department at [phone] between the hours of 8:00 am and 5:00 pm, Monday-Friday.  ________________________________________________________________________  High-Fiber Diet Fiber is found in fruits, vegetables, and grains. A high-fiber diet encourages the addition of more whole grains, legumes, fruits, and vegetables in your diet. The recommended amount of fiber for adult males is 38 g per day. For adult females, it is 25 g per day. Pregnant and lactating women should get 28 g of fiber per day. If you have a digestive or bowel problem, ask your caregiver for advice before adding high-fiber foods to your diet. Eat a variety of high-fiber foods instead of only a select few type of foods.  PURPOSE  To increase stool bulk.  To make bowel movements more regular to prevent constipation.  To lower cholesterol.  To prevent overeating. WHEN IS THIS DIET USED?  It may be used if you have constipation and hemorrhoids.  It may be used if you have uncomplicated diverticulosis (intestine condition) and irritable bowel syndrome.  It may be used if you need help with weight management.  It may be used if you want to add it to your diet as a protective measure against atherosclerosis, diabetes, and cancer. SOURCES OF FIBER  Whole-grain breads and cereals.  Fruits, such as  apples, oranges, bananas, berries, prunes, and pears.  Vegetables, such as green peas, carrots, sweet potatoes, beets, broccoli, cabbage, spinach, and artichokes.  Legumes, such split peas, soy, lentils.  Almonds. FIBER CONTENT IN FOODS Starches and Grains / Dietary Fiber (g)  Cheerios, 1 cup / 3 g  Corn Flakes  cereal, 1 cup / 0.7 g  Rice crispy treat cereal, 1 cup / 0.3 g  Instant oatmeal (cooked),  cup / 2 g  Frosted wheat cereal, 1 cup / 5.1 g  Brown, long-grain rice (cooked), 1 cup / 3.5 g  White, long-grain rice (cooked), 1 cup / 0.6 g  Enriched macaroni (cooked), 1 cup / 2.5 g Legumes / Dietary Fiber (g)  Baked beans (canned, plain, or vegetarian),  cup / 5.2 g  Kidney beans (canned),  cup / 6.8 g  Pinto beans (cooked),  cup / 5.5 g Breads and Crackers / Dietary Fiber (g)  Plain or honey graham crackers, 2 squares / 0.7 g  Saltine crackers, 3 squares / 0.3 g  Plain, salted pretzels, 10 pieces / 1.8 g  Whole-wheat bread, 1 slice / 1.9 g  White bread, 1 slice / 0.7 g  Raisin bread, 1 slice / 1.2 g  Plain bagel, 3 oz / 2 g  Flour tortilla, 1 oz / 0.9 g  Corn tortilla, 1 small / 1.5 g  Hamburger or hotdog bun, 1 small / 0.9 g Fruits / Dietary Fiber (g)  Apple with skin, 1 medium / 4.4 g  Sweetened applesauce,  cup / 1.5 g  Banana,  medium / 1.5 g  Grapes, 10 grapes / 0.4 g  Orange, 1 small / 2.3 g  Raisin, 1.5 oz / 1.6 g  Melon, 1 cup / 1.4 g Vegetables / Dietary Fiber (g)  Green beans (canned),  cup / 1.3 g  Carrots (cooked),  cup / 2.3 g  Broccoli (cooked),  cup / 2.8 g  Peas (cooked),  cup / 4.4 g  Mashed potatoes,  cup / 1.6 g  Lettuce, 1 cup / 0.5 g  Corn (canned),  cup / 1.6 g  Tomato,  cup / 1.1 g Document Released: 11/16/2005 Document Revised: 05/17/2012 Document Reviewed: 02/18/2012 [facility] Patient Information 2013 [place], [place].

## 2013-01-25 ENCOUNTER — Inpatient Hospital Stay: Admission: RE | Admit: 2013-01-25 | Payer: 59 | Source: Ambulatory Visit

## 2013-01-25 ENCOUNTER — Other Ambulatory Visit: Payer: 59

## 2013-01-25 DIAGNOSIS — K529 Noninfective gastroenteritis and colitis, unspecified: Secondary | ICD-10-CM

## 2013-01-25 DIAGNOSIS — R1032 Left lower quadrant pain: Secondary | ICD-10-CM

## 2013-01-26 ENCOUNTER — Telehealth: Payer: Self-pay | Admitting: Gastroenterology

## 2013-01-26 ENCOUNTER — Telehealth: Payer: Self-pay | Admitting: *Deleted

## 2013-01-26 DIAGNOSIS — D582 Other hemoglobinopathies: Secondary | ICD-10-CM

## 2013-01-26 LAB — OVA AND PARASITE SCREEN: OP: NONE SEEN

## 2013-01-26 MED ORDER — FERROUS FUM-IRON POLYSACCH-FA 162-115.2-1 MG PO CAPS: 1.0000 | ORAL_CAPSULE | Freq: Every day | ORAL | Status: DC

## 2013-01-26 NOTE — Telephone Encounter (Signed)
[facility] does not have Tandem; they will substitute with another med with Ferrous Fumarate.

## 2013-01-26 NOTE — Telephone Encounter (Signed)
Needs IFOB and daily Tandem for now Informed pt of lab results and Dr Norval Gable instruction/orders for IFOB testing and for Tandem use. Pt stated understanding.

## 2013-01-27 ENCOUNTER — Other Ambulatory Visit (INDEPENDENT_AMBULATORY_CARE_PROVIDER_SITE_OTHER): Payer: 59

## 2013-01-27 DIAGNOSIS — D582 Other hemoglobinopathies: Secondary | ICD-10-CM

## 2013-01-27 LAB — FECAL OCCULT BLOOD, IMMUNOCHEMICAL: Fecal Occult Bld: NEGATIVE

## 2013-01-27 LAB — CLOSTRIDIUM DIFFICILE BY PCR: Toxigenic C. Difficile by PCR: NOT DETECTED

## 2013-01-30 ENCOUNTER — Ambulatory Visit (INDEPENDENT_AMBULATORY_CARE_PROVIDER_SITE_OTHER)
Admission: RE | Admit: 2013-01-30 | Discharge: 2013-01-30 | Disposition: A | Discharge status: Home / Self Care | Payer: 59 | Source: Ambulatory Visit | Attending: Gastroenterology | Admitting: Gastroenterology

## 2013-01-30 DIAGNOSIS — R109 Unspecified abdominal pain: Secondary | ICD-10-CM

## 2013-01-30 MED ORDER — IOHEXOL 300 MG/ML  SOLN
100.0000 mL | Freq: Once | INTRAMUSCULAR | Status: AC | PRN
  Administered 2013-01-30: 100 mL via INTRAVENOUS

## 2013-02-02 ENCOUNTER — Telehealth: Payer: Self-pay | Admitting: Gastroenterology

## 2013-02-02 NOTE — Telephone Encounter (Signed)
Informed pt of CT impression results; informed him Dr Jarold Motto may have something to add, but this is what the report stated. He states he feels better with the Robinul Forte.

## 2013-10-05 ENCOUNTER — Other Ambulatory Visit: Payer: Self-pay

## 2014-04-10 ENCOUNTER — Encounter (HOSPITAL_COMMUNITY): Payer: Self-pay | Admitting: Emergency Medicine

## 2014-04-10 ENCOUNTER — Emergency Department (HOSPITAL_COMMUNITY)
Admission: EM | Admit: 2014-04-10 | Discharge: 2014-04-10 | Disposition: A | Discharge status: Home / Self Care | Payer: 59 | Attending: Emergency Medicine | Admitting: Emergency Medicine

## 2014-04-10 ENCOUNTER — Emergency Department (INDEPENDENT_AMBULATORY_CARE_PROVIDER_SITE_OTHER): Payer: 59

## 2014-04-10 ENCOUNTER — Emergency Department (HOSPITAL_COMMUNITY)
Admission: EM | Admit: 2014-04-10 | Discharge: 2014-04-10 | Disposition: A | Discharge status: Nursing Home (Includes ICF) | Payer: 59 | Source: Home / Self Care | Attending: Family Medicine | Admitting: Family Medicine

## 2014-04-10 DIAGNOSIS — J189 Pneumonia, unspecified organism: Secondary | ICD-10-CM

## 2014-04-10 DIAGNOSIS — K219 Gastro-esophageal reflux disease without esophagitis: Secondary | ICD-10-CM | POA: Insufficient documentation

## 2014-04-10 DIAGNOSIS — G43909 Migraine, unspecified, not intractable, without status migrainosus: Secondary | ICD-10-CM | POA: Insufficient documentation

## 2014-04-10 DIAGNOSIS — Z7982 Long term (current) use of aspirin: Secondary | ICD-10-CM | POA: Insufficient documentation

## 2014-04-10 DIAGNOSIS — Z8546 Personal history of malignant neoplasm of prostate: Secondary | ICD-10-CM | POA: Insufficient documentation

## 2014-04-10 DIAGNOSIS — J159 Unspecified bacterial pneumonia: Secondary | ICD-10-CM | POA: Insufficient documentation

## 2014-04-10 LAB — CBC
HCT: 36.9 % — ABNORMAL LOW (ref 39.0–52.0)
Hemoglobin: 12.3 g/dL — ABNORMAL LOW (ref 13.0–17.0)
MCH: 27.9 pg (ref 26.0–34.0)
MCHC: 33.3 g/dL (ref 30.0–36.0)
MCV: 83.7 fL (ref 78.0–100.0)
Platelets: 290 10*3/uL (ref 150–400)
RBC: 4.41 MIL/uL (ref 4.22–5.81)
RDW: 12.7 % (ref 11.5–15.5)
WBC: 10.7 10*3/uL — ABNORMAL HIGH (ref 4.0–10.5)

## 2014-04-10 LAB — PRO B NATRIURETIC PEPTIDE: Pro B Natriuretic peptide (BNP): 233.3 pg/mL — ABNORMAL HIGH (ref 0–125)

## 2014-04-10 LAB — BASIC METABOLIC PANEL
BUN: 10 mg/dL (ref 6–23)
CO2: 23 mEq/L (ref 19–32)
Calcium: 9.3 mg/dL (ref 8.4–10.5)
Chloride: 103 mEq/L (ref 96–112)
Creatinine, Ser: 0.73 mg/dL (ref 0.50–1.35)
GFR calc Af Amer: >90 mL/min (ref >90)
GFR calc non Af Amer: >90 mL/min (ref >90)
Glucose, Bld: 91 mg/dL (ref 70–99)
Potassium: 4 mEq/L (ref 3.7–5.3)
Sodium: 140 mEq/L (ref 137–147)

## 2014-04-10 LAB — I-STAT TROPONIN, ED: Troponin i, poc: 0 ng/mL (ref 0.00–0.08)

## 2014-04-10 MED ORDER — DOXYCYCLINE HYCLATE 100 MG PO CAPS: 100.0000 mg | ORAL_CAPSULE | Freq: Two times a day (BID) | ORAL | Status: AC

## 2014-04-10 MED ORDER — DOXYCYCLINE HYCLATE 100 MG PO TABS
100.0000 mg | ORAL_TABLET | Freq: Once | ORAL | Status: AC
  Administered 2014-04-10: 100 mg via ORAL
  Filled 2014-04-10: qty 1

## 2014-04-10 NOTE — ED Notes (Signed)
Pt  Has    intermittant   Episodes  of   Body  Aches   -  No  Fever         With  Symptoms     X  10  Days   Some  Dry  Cough          Pt  Is  Awake  And  Alert  And  Oriented         denys  Ant  Known tick  Bites

## 2014-04-10 NOTE — ED Provider Notes (Signed)
CSN: [id number]     Arrival date & time 04/10/14  1842 History   First MD Initiated Contact with Patient 04/10/14 2111     Chief Complaint  Patient presents with  . Shortness of Breath     (Consider location/radiation/quality/duration/timing/severity/associated sxs/prior Treatment) Patient is a 58 y.o. male presenting with shortness of breath. The history is provided by the patient and medical records. No language interpreter was used.  Shortness of Breath Severity:  Moderate Onset quality:  Gradual Duration:  10 days Timing:  Intermittent Progression:  Waxing and waning Chronicity:  New Relieved by:  Rest Worsened by:  Nothing tried Ineffective treatments:  NSAIDs Associated symptoms: cough, fever and sputum production   Associated symptoms: no hemoptysis, no sore throat, no syncope, no vomiting and no wheezing   Risk factors: no recent alcohol use, no family hx of DVT, no prolonged immobilization and no recent surgery     Past Medical History  Diagnosis Date  . Cancer - prostate   . GERD (gastroesophageal reflux disease)   . Migraine    Past Surgical History  Procedure Laterality Date  . Prostatectomy  11-2008  . Mole removal      off neck   Family History  Problem Relation Age of Onset  . Pancreatic cancer Mother   . Emphysema Father   . Colon cancer Neg Hx   . Colon polyps Brother   . Diabetes Mother    History  Substance Use Topics  . Smoking status: Never Smoker   . Smokeless tobacco: Never Used  . Alcohol Use: No    Review of Systems  Constitutional: Positive for fever.  HENT: Negative for sore throat.   Respiratory: Positive for cough, sputum production and shortness of breath. Negative for hemoptysis and wheezing.   Cardiovascular: Negative for syncope.  Gastrointestinal: Negative for vomiting.  All other systems reviewed and are negative.     Allergies  Review of patient's allergies indicates no known allergies.  Home Medications   Prior  to Admission medications   Medication Sig Start Date End Date Taking? Authorizing Provider  acetaminophen (TYLENOL) 500 MG tablet Take 1,000 mg by mouth every 6 (six) hours as needed for moderate pain.    Yes Historical Provider, MD  aspirin 81 MG tablet Take 81 mg by mouth daily.   Yes Historical Provider, MD  dextromethorphan-guaiFENesin (ROBITUSSIN-DM) 10-100 MG/5ML liquid Take 5 mLs by mouth every 4 (four) hours as needed for cough.   Yes Historical Provider, MD  esomeprazole (NEXIUM) 40 MG capsule Take 40 mg by mouth daily as needed (reflux).   Yes Historical Provider, MD  ibuprofen (ADVIL,MOTRIN) 200 MG tablet Take 400 mg by mouth every 6 (six) hours as needed for moderate pain.    Yes Historical Provider, MD  Multiple Vitamin (MULTIVITAMINS PO) Take 1 tablet by mouth daily.   Yes Historical Provider, MD  topiramate (TOPAMAX) 100 MG tablet Take 100 mg by mouth at bedtime.   Yes Historical Provider, MD   BP 155/86  Pulse 84  Temp(Src) 98.7 F (37.1 C) (Oral)  Resp 20  Ht 6' (1.829 m)  Wt 158 lb (71.668 kg)  BMI 21.42 kg/m2  SpO2 100% Physical Exam  Nursing note and vitals reviewed. Constitutional: He is oriented to person, place, and time. He appears well-developed and well-nourished.  HENT:  Head: Normocephalic and atraumatic.  Right Ear: External ear normal.  Left Ear: External ear normal.  Mouth/Throat: Oropharynx is clear and moist.  Eyes: Conjunctivae and EOM  are normal. Pupils are equal, round, and reactive to light.  Neck: Normal range of motion. Neck supple.  Cardiovascular: Normal rate, regular rhythm, normal heart sounds and intact distal pulses.   Pulmonary/Chest: Effort normal and breath sounds normal. No respiratory distress. He has no rales.  Mild rhonchi on upper region of right lung  Abdominal: Soft. Bowel sounds are normal.  Musculoskeletal: Normal range of motion. He exhibits no edema and no tenderness.  Neurological: He is alert and oriented to person, place,  and time.  Skin: Skin is warm and dry.  Psychiatric: He has a normal mood and affect.    ED Course  Procedures (including critical care time) Labs Review Labs Reviewed  CBC - Abnormal; Notable for the following:    WBC 10.7 (*)    Hemoglobin 12.3 (*)    HCT 36.9 (*)    All other components within normal limits  PRO B NATRIURETIC PEPTIDE - Abnormal; Notable for the following:    Pro B Natriuretic peptide (BNP) 233.3 (*)    All other components within normal limits  BASIC METABOLIC PANEL  Randolm Idol, ED    Imaging Review Dg Chest 2 View  04/10/2014   CLINICAL DATA:  sob, cp illness for 10 days.  Cough.  Fever.  EXAM: CHEST  2 VIEW  COMPARISON:  None.  FINDINGS: Inferior right upper lobe airspace disease is present respecting the major fissure. Cardiopericardial silhouette appears within normal limits. No other foci of airspace disease are present. Bones appear within normal limits. Mediastinal contours appear unremarkable.  IMPRESSION: Right upper lobe pneumonia. Followup in 4-6 weeks to ensure radiographic clearing and exclude an underlying lesion is recommended.   Electronically Signed   By: Dereck Ligas M.D.   On: 04/10/2014 18:04     EKG Interpretation None      Date: 04/10/2014  Rate: 77  Rhythm: normal sinus rhythm  QRS Axis: normal  Intervals: normal  ST/T Wave abnormalities: normal  Conduction Disutrbances:none  Narrative Interpretation:   Old EKG Reviewed: none available    MDM   Final diagnoses:  Community acquired pneumonia   Patient presents to emergency Department via urgent care due to concern for pneumonia. His vital signs here were unremarkable including no fever no tachycardia no hypoxia. On exam patient with rhonchi in the right upper lung field otherwise physical exam was unremarkable. Chest x-ray was reviewed from urgent care and right upper lobe opacity confirmed.  CBC and BMP were obtained here and remarkable only for mild leukocytosis. I-STAT  troponin and BNP were obtained in triage per protocol and both unremarkable. EKG obtained and show no signs of acute ischemia. Risk stratification with curb 65 and PSI were completed and there was no indication for admission as overall mortality risk is very low. Patient is in agreement for discharge and close pcp followup. Will discharge with doxycycline and instructed to followup with PCP as he will need reevaluation and repeat imaging.    Corlis Leak, MD 04/11/14 [phone]

## 2014-04-10 NOTE — ED Provider Notes (Signed)
CSN: [id number]     Arrival date & time 04/10/14  1616 History   First MD Initiated Contact with Patient 04/10/14 1708     Chief Complaint  Patient presents with  . Generalized Body Aches   (Consider location/radiation/quality/duration/timing/severity/associated sxs/prior Treatment) Patient is a 58 y.o. male presenting with general illness. The history is provided by the patient.  Illness Severity:  Mild Onset quality:  Gradual Duration:  10 days Progression:  Unchanged Chronicity:  Recurrent Context:  Mild myalgias, no rash, similar sx in past with neg w/u per lmd., sl sob and mild cp sx. Associated symptoms: chest pain, myalgias and shortness of breath   Associated symptoms: no fever and no rash     Past Medical History  Diagnosis Date  . Cancer - prostate   . GERD (gastroesophageal reflux disease)   . Migraine    Past Surgical History  Procedure Laterality Date  . Prostatectomy  11-2008  . Mole removal      off neck   Family History  Problem Relation Age of Onset  . Pancreatic cancer Mother   . Emphysema Father   . Colon cancer Neg Hx   . Colon polyps Brother   . Diabetes Mother    History  Substance Use Topics  . Smoking status: Never Smoker   . Smokeless tobacco: Never Used  . Alcohol Use: No    Review of Systems  Constitutional: Negative.  Negative for fever.  Respiratory: Positive for shortness of breath.   Cardiovascular: Positive for chest pain.  Gastrointestinal: Negative.   Genitourinary: Negative.   Musculoskeletal: Positive for myalgias.  Skin: Negative for rash.    Allergies  Review of patient's allergies indicates no known allergies.  Home Medications   Prior to Admission medications   Medication Sig Start Date End Date Taking? Authorizing Provider  aspirin 81 MG tablet Take 81 mg by mouth daily.    Historical Provider, MD  Ferrous Fum-Iron Polysacch-FA 162-115.2-1 MG CAPS Take 1 capsule by mouth daily. 01/26/13   Sable Feil, MD   glycopyrrolate (ROBINUL-FORTE) 2 MG tablet Take 1 tablet (2 mg total) by mouth 3 (three) times daily as needed. 01/24/13   Sable Feil, MD  Multiple Vitamin (MULTIVITAMINS PO) Take 1 tablet by mouth daily.    Historical Provider, MD  topiramate (TOPAMAX) 100 MG tablet Take 100 mg by mouth at bedtime.    Historical Provider, MD   BP 158/88  Pulse 84  Temp(Src) 98.7 F (37.1 C) (Oral)  Resp 16  SpO2 100% Physical Exam  Nursing note and vitals reviewed. Constitutional: He is oriented to person, place, and time. He appears well-developed and well-nourished.  HENT:  Head: Normocephalic.  Right Ear: External ear normal.  Left Ear: External ear normal.  Mouth/Throat: Oropharynx is clear and moist.  Neck: Normal range of motion. Neck supple.  Cardiovascular: Normal heart sounds.   Pulmonary/Chest: Effort normal and breath sounds normal.  Abdominal: Soft. Bowel sounds are normal.  Lymphadenopathy:    He has no cervical adenopathy.  Neurological: He is alert and oriented to person, place, and time.  Skin: Skin is warm and dry.    ED Course  Procedures (including critical care time) Labs Review Labs Reviewed - No data to display  Imaging Review Dg Chest 2 View  04/10/2014   CLINICAL DATA:  sob, cp illness for 10 days.  Cough.  Fever.  EXAM: CHEST  2 VIEW  COMPARISON:  None.  FINDINGS: Inferior right upper lobe airspace  disease is present respecting the major fissure. Cardiopericardial silhouette appears within normal limits. No other foci of airspace disease are present. Bones appear within normal limits. Mediastinal contours appear unremarkable.  IMPRESSION: Right upper lobe pneumonia. Followup in 4-6 weeks to ensure radiographic clearing and exclude an underlying lesion is recommended.   Electronically Signed   By: Dereck Ligas M.D.   On: 04/10/2014 18:04    X-rays reviewed and report per radiologist.  MDM   1. CAP (community acquired pneumonia)    Sent for further eval  of sx in view of cxr findings, ie not compatible, poss metastatic disease.   Billy Fischer, MD 04/10/14 2044

## 2014-04-10 NOTE — ED Notes (Signed)
Pt reports chest tightness, SOB, chill, cough, and back aches for approx 10 days. Pt was seen at urgent care and was told to come to ED for further eval of possible pneumonia. NAD noted pt speaking in full sentences.

## 2014-04-11 NOTE — ED Provider Notes (Signed)
I saw and evaluated the patient, reviewed the resident's note and I agree with the findings and plan.   EKG Interpretation None      PT sent for eval of possible PNA at Urgent Care. Labs unremarkable. Outpatient treatment  Sandor Arboleda B. Karle Starch, MD 04/11/14 1251

## 2014-10-22 ENCOUNTER — Encounter (HOSPITAL_COMMUNITY): Payer: Self-pay | Admitting: Emergency Medicine

## 2014-10-22 ENCOUNTER — Emergency Department (HOSPITAL_COMMUNITY)
Admission: EM | Admit: 2014-10-22 | Discharge: 2014-10-22 | Disposition: A | Discharge status: Home / Self Care | Payer: 59 | Source: Home / Self Care | Attending: Emergency Medicine | Admitting: Emergency Medicine

## 2014-10-22 DIAGNOSIS — S41112A Laceration without foreign body of left upper arm, initial encounter: Secondary | ICD-10-CM

## 2014-10-22 DIAGNOSIS — Z23 Encounter for immunization: Secondary | ICD-10-CM

## 2014-10-22 MED ORDER — TETANUS-DIPHTH-ACELL PERTUSSIS 5-2.5-18.5 LF-MCG/0.5 IM SUSP
INTRAMUSCULAR | Status: AC
  Filled 2014-10-22: qty 0.5

## 2014-10-22 MED ORDER — CEPHALEXIN 500 MG PO CAPS: 500.0000 mg | ORAL_CAPSULE | Freq: Three times a day (TID) | ORAL | Status: DC

## 2014-10-22 MED ORDER — TETANUS-DIPHTH-ACELL PERTUSSIS 5-2.5-18.5 LF-MCG/0.5 IM SUSP
0.5000 mL | Freq: Once | INTRAMUSCULAR | Status: AC
  Administered 2014-10-22: 0.5 mL via INTRAMUSCULAR

## 2014-10-22 NOTE — ED Notes (Signed)
Provided ice/coke.  Patient lying flat.

## 2014-10-22 NOTE — ED Notes (Signed)
Cut x 2 to left hand and wrist.  Describes left wrist as a puncture wound and laceration to base of left index finger, bleeding controlled.  Patient was cutting a bed liner for a truck when he was injured yesterday around 10:30 am

## 2014-10-22 NOTE — ED Provider Notes (Signed)
  Chief Complaint   Laceration   History of Present Illness   Benjamin Robbins is a 58 year old male who lacerated his left hand yesterday with a razor knife, trying to put in a truck bed. The lacerations were shallow. He has 1 over the dorsum of the MCP of the left thumb, and one over the volar aspect of the wrist. The lesion of the volar aspect of the wrist is swollen but there is no redness, pain, or purulent drainage. He cannot recall when his last tetanus vaccine was.  Review of Systems   Other than as noted above, the patient denies any of the following symptoms: Musculoskeletal:  No joint pain or decreased range of motion. Neuro:  No numbness, tingling, or weakness.  [place]   Past medical history, family history, social history, meds, and allergies were reviewed.   Physical Examination     Vital signs:  BP 163/88 mmHg  Pulse 78  Temp(Src) 98.4 F (36.9 C) (Oral)  Resp 16  SpO2 100% Ext:  He has 2 small shallow lacerations, one overlying the dorsum of the MCP joint of the thumb, and one on the wrist. These only go down dissecting this tissue and do not require suturing. The wound on the wrist shows some surrounding puffiness but no erythema, heat, or tenderness to palpation. Extensor tendon function was intact.  All other joints had a full ROM without pain.  Pulses were full.  Good capillary refill in all digits.  No edema. Neurological:  Alert and oriented.  No muscle weakness.  Sensation was intact to light touch.   Procedure Note:  Verbal informed consent was obtained.  The patient was informed of the risks and benefits of the procedure and understands and accepts.  A time out was called and the identity of the patient and correct procedure were confirmed.   The laceration area described above was prepped with saline. The wound was then closed as follows:  Both wounds were closed with Dermabond.  There were no immediate complications, and the patient tolerated the procedure  well. The laceration was then cleansed, Bacitracin ointment was applied and a clean, dry pressure dressing was put on.   After the Dermabond was applied, the patient felt a little dizzy and appeared pale. He was allowed to lie down and after a while felt better.    Course in Urgent [place]   Given the Tdap vaccine.  Assessment   The encounter diagnosis was Lacerations of multiple sites of left arm, initial encounter.  Plan   1.  Meds:  The following meds were prescribed:   Discharge Medication List as of 10/22/2014  9:53 AM    START taking these medications   Details  cephALEXin (KEFLEX) 500 MG capsule Take 1 capsule (500 mg total) by mouth 3 (three) times daily., Starting 10/22/2014, Until Discontinued, Normal        2.  Patient Education/Counseling:  The patient was given appropriate handouts, self care instructions, and instructed in symptomatic relief. Instructions were given for wound care.    3.  Follow up:  The patient was told to follow up immediately if there is any sign of infection.   Harden Mo, MD 10/22/14 1146

## 2014-10-22 NOTE — Discharge Instructions (Signed)
Wash with soap and water, leave open, do not apply antibiotic ointment.

## 2014-10-22 NOTE — ED Notes (Signed)
Patient had episode of feeling lightheaded while dr Jake Michaelis applying dermabond

## 2015-12-13 DIAGNOSIS — L309 Dermatitis, unspecified: Secondary | ICD-10-CM | POA: Diagnosis not present

## 2015-12-13 DIAGNOSIS — E559 Vitamin D deficiency, unspecified: Secondary | ICD-10-CM | POA: Diagnosis not present

## 2015-12-13 DIAGNOSIS — R252 Cramp and spasm: Secondary | ICD-10-CM | POA: Diagnosis not present

## 2015-12-13 MED FILL — predniSONE 20 MG TABS: 20 | 9 days supply | Qty: 18 | Fill #0

## 2016-02-27 MED FILL — TOPIRAMATE 100 MG TABLET: 100 | 90 days supply | Qty: 90 | Fill #0

## 2016-04-24 DIAGNOSIS — Z0000 Encounter for general adult medical examination without abnormal findings: Secondary | ICD-10-CM | POA: Diagnosis not present

## 2016-04-24 DIAGNOSIS — E559 Vitamin D deficiency, unspecified: Secondary | ICD-10-CM | POA: Diagnosis not present

## 2016-04-24 DIAGNOSIS — G43109 Migraine with aura, not intractable, without status migrainosus: Secondary | ICD-10-CM | POA: Diagnosis not present

## 2016-04-24 DIAGNOSIS — R030 Elevated blood-pressure reading, without diagnosis of hypertension: Secondary | ICD-10-CM | POA: Diagnosis not present

## 2016-04-24 DIAGNOSIS — Z8546 Personal history of malignant neoplasm of prostate: Secondary | ICD-10-CM | POA: Diagnosis not present

## 2016-06-03 MED FILL — TOPIRAMATE 100 MG TABLET: 100 | 90 days supply | Qty: 90 | Fill #0

## 2016-06-25 ENCOUNTER — Encounter (HOSPITAL_COMMUNITY): Payer: Self-pay | Admitting: *Deleted

## 2016-06-25 ENCOUNTER — Encounter (HOSPITAL_COMMUNITY)
Admission: EM | Disposition: A | Discharge status: Home / Self Care | Payer: Self-pay | Source: Home / Self Care | Attending: Surgery

## 2016-06-25 ENCOUNTER — Emergency Department (HOSPITAL_COMMUNITY): Payer: 59 | Admitting: Certified Registered Nurse Anesthetist

## 2016-06-25 ENCOUNTER — Inpatient Hospital Stay (HOSPITAL_COMMUNITY)
Admission: EM | Admit: 2016-06-25 | Discharge: 2016-06-30 | DRG: 234 | Disposition: A | Discharge status: Home / Self Care | Payer: 59 | Attending: Surgery | Admitting: Surgery

## 2016-06-25 ENCOUNTER — Emergency Department (HOSPITAL_COMMUNITY): Payer: 59

## 2016-06-25 DIAGNOSIS — I472 Ventricular tachycardia: Secondary | ICD-10-CM | POA: Diagnosis not present

## 2016-06-25 DIAGNOSIS — Z8249 Family history of ischemic heart disease and other diseases of the circulatory system: Secondary | ICD-10-CM | POA: Diagnosis not present

## 2016-06-25 DIAGNOSIS — I5020 Unspecified systolic (congestive) heart failure: Secondary | ICD-10-CM | POA: Diagnosis present

## 2016-06-25 DIAGNOSIS — Z951 Presence of aortocoronary bypass graft: Secondary | ICD-10-CM

## 2016-06-25 DIAGNOSIS — Z800 Family history of malignant neoplasm of digestive organs: Secondary | ICD-10-CM

## 2016-06-25 DIAGNOSIS — Z7982 Long term (current) use of aspirin: Secondary | ICD-10-CM

## 2016-06-25 DIAGNOSIS — E785 Hyperlipidemia, unspecified: Secondary | ICD-10-CM | POA: Diagnosis not present

## 2016-06-25 DIAGNOSIS — I5021 Acute systolic (congestive) heart failure: Secondary | ICD-10-CM | POA: Diagnosis not present

## 2016-06-25 DIAGNOSIS — I2510 Atherosclerotic heart disease of native coronary artery without angina pectoris: Secondary | ICD-10-CM | POA: Diagnosis not present

## 2016-06-25 DIAGNOSIS — I213 ST elevation (STEMI) myocardial infarction of unspecified site: Secondary | ICD-10-CM

## 2016-06-25 DIAGNOSIS — I2109 ST elevation (STEMI) myocardial infarction involving other coronary artery of anterior wall: Principal | ICD-10-CM

## 2016-06-25 DIAGNOSIS — I471 Supraventricular tachycardia: Secondary | ICD-10-CM | POA: Diagnosis not present

## 2016-06-25 DIAGNOSIS — K219 Gastro-esophageal reflux disease without esophagitis: Secondary | ICD-10-CM | POA: Diagnosis present

## 2016-06-25 DIAGNOSIS — Z8546 Personal history of malignant neoplasm of prostate: Secondary | ICD-10-CM | POA: Diagnosis not present

## 2016-06-25 DIAGNOSIS — I25110 Atherosclerotic heart disease of native coronary artery with unstable angina pectoris: Secondary | ICD-10-CM | POA: Diagnosis not present

## 2016-06-25 DIAGNOSIS — I080 Rheumatic disorders of both mitral and aortic valves: Secondary | ICD-10-CM | POA: Diagnosis not present

## 2016-06-25 DIAGNOSIS — I252 Old myocardial infarction: Secondary | ICD-10-CM | POA: Diagnosis present

## 2016-06-25 HISTORY — PX: CARDIAC CATHETERIZATION: SHX172

## 2016-06-25 HISTORY — PX: CORONARY ARTERY BYPASS GRAFT: SHX141

## 2016-06-25 LAB — POCT I-STAT 3, ART BLOOD GAS (G3+)
Acid-base deficit: 6 mmol/L — ABNORMAL HIGH (ref 0.0–2.0)
Bicarbonate: 17.6 mEq/L — ABNORMAL LOW (ref 20.0–24.0)
O2 Saturation: 99 %
TCO2: 19 mmol/L (ref 0–100)
pCO2 arterial: 29 mmHg — ABNORMAL LOW (ref 35.0–45.0)
pH, Arterial: 7.393 (ref 7.350–7.450)
pO2, Arterial: 147 mmHg — ABNORMAL HIGH (ref 80.0–100.0)

## 2016-06-25 LAB — I-STAT TROPONIN, ED: Troponin i, poc: 0.02 ng/mL (ref 0.00–0.08)

## 2016-06-25 LAB — I-STAT CHEM 8, ED
BUN: 15 mg/dL (ref 6–20)
Calcium, Ion: 1.12 mmol/L (ref 1.12–1.23)
Chloride: 105 mmol/L (ref 101–111)
Creatinine, Ser: 1 mg/dL (ref 0.61–1.24)
Glucose, Bld: 123 mg/dL — ABNORMAL HIGH (ref 65–99)
HCT: 35 % — ABNORMAL LOW (ref 39.0–52.0)
Hemoglobin: 11.9 g/dL — ABNORMAL LOW (ref 13.0–17.0)
Potassium: 3.5 mmol/L (ref 3.5–5.1)
Sodium: 141 mmol/L (ref 135–145)
TCO2: 21 mmol/L (ref 0–100)

## 2016-06-25 LAB — LIPID PANEL
Cholesterol: 154 mg/dL (ref 0–200)
HDL: 35 mg/dL — ABNORMAL LOW (ref >40)
LDL Cholesterol: 111 mg/dL — ABNORMAL HIGH (ref 0–99)
Total CHOL/HDL Ratio: 4.4 RATIO
Triglycerides: 40 mg/dL (ref <150)
VLDL: 8 mg/dL (ref 0–40)

## 2016-06-25 LAB — BASIC METABOLIC PANEL
Anion gap: 5 (ref 5–15)
BUN: 12 mg/dL (ref 6–20)
CO2: 20 mmol/L — ABNORMAL LOW (ref 22–32)
Calcium: 8 mg/dL — ABNORMAL LOW (ref 8.9–10.3)
Chloride: 110 mmol/L (ref 101–111)
Creatinine, Ser: 0.74 mg/dL (ref 0.61–1.24)
GFR calc Af Amer: >60 mL/min (ref >60)
GFR calc non Af Amer: >60 mL/min (ref >60)
Glucose, Bld: 117 mg/dL — ABNORMAL HIGH (ref 65–99)
Potassium: 3.2 mmol/L — ABNORMAL LOW (ref 3.5–5.1)
Sodium: 135 mmol/L (ref 135–145)

## 2016-06-25 LAB — HEPATIC FUNCTION PANEL
ALT: 21 U/L (ref 17–63)
AST: 24 U/L (ref 15–41)
Albumin: 3.5 g/dL (ref 3.5–5.0)
Alkaline Phosphatase: 62 U/L (ref 38–126)
Bilirubin, Direct: <0.1 mg/dL — ABNORMAL LOW (ref 0.1–0.5)
Total Bilirubin: 0.3 mg/dL (ref 0.3–1.2)
Total Protein: 5.5 g/dL — ABNORMAL LOW (ref 6.5–8.1)

## 2016-06-25 LAB — CBC
HCT: 33.1 % — ABNORMAL LOW (ref 39.0–52.0)
Hemoglobin: 10.7 g/dL — ABNORMAL LOW (ref 13.0–17.0)
MCH: 25.8 pg — ABNORMAL LOW (ref 26.0–34.0)
MCHC: 32.3 g/dL (ref 30.0–36.0)
MCV: 80 fL (ref 78.0–100.0)
Platelets: 216 10*3/uL (ref 150–400)
RBC: 4.14 MIL/uL — ABNORMAL LOW (ref 4.22–5.81)
RDW: 14.3 % (ref 11.5–15.5)
WBC: 12.2 10*3/uL — ABNORMAL HIGH (ref 4.0–10.5)

## 2016-06-25 LAB — HEMOGLOBIN AND HEMATOCRIT, BLOOD
HCT: 26.1 % — ABNORMAL LOW (ref 39.0–52.0)
Hemoglobin: 8.4 g/dL — ABNORMAL LOW (ref 13.0–17.0)

## 2016-06-25 LAB — TROPONIN I: Troponin I: 0.14 ng/mL (ref <0.03)

## 2016-06-25 LAB — PREPARE RBC (CROSSMATCH)

## 2016-06-25 LAB — APTT: aPTT: 164 seconds (ref 24–36)

## 2016-06-25 LAB — ABO/RH: ABO/RH(D): B POS

## 2016-06-25 LAB — PLATELET COUNT: Platelets: 171 10*3/uL (ref 150–400)

## 2016-06-25 LAB — TSH: TSH: 4.531 u[IU]/mL — ABNORMAL HIGH (ref 0.350–4.500)

## 2016-06-25 SURGERY — LEFT HEART CATH AND CORONARY ANGIOGRAPHY
Anesthesia: LOCAL

## 2016-06-25 SURGERY — CORONARY ARTERY BYPASS GRAFTING (CABG)
Anesthesia: General | Site: Chest

## 2016-06-25 MED ORDER — SODIUM CHLORIDE 0.9 % IV SOLN
INTRAVENOUS | Status: AC
  Administered 2016-06-25: 1.6 [IU]/h via INTRAVENOUS
  Filled 2016-06-25: qty 2.5

## 2016-06-25 MED ORDER — HEPARIN SODIUM (PORCINE) 1000 UNIT/ML IJ SOLN
INTRAMUSCULAR | Status: AC
  Filled 2016-06-25: qty 3

## 2016-06-25 MED ORDER — VANCOMYCIN HCL 10 G IV SOLR
1250.0000 mg | INTRAVENOUS | Status: AC
  Administered 2016-06-25: 1250 mg via INTRAVENOUS
  Filled 2016-06-25: qty 1250

## 2016-06-25 MED ORDER — SODIUM CHLORIDE 0.9 % IJ SOLN
INTRAMUSCULAR | Status: AC
  Filled 2016-06-25: qty 10

## 2016-06-25 MED ORDER — MAGNESIUM SULFATE 50 % IJ SOLN
40.0000 meq | INTRAMUSCULAR | Status: DC
  Filled 2016-06-25: qty 10

## 2016-06-25 MED ORDER — LIDOCAINE HCL (PF) 1 % IJ SOLN
INTRAMUSCULAR | Status: AC
  Filled 2016-06-25: qty 30

## 2016-06-25 MED ORDER — LIDOCAINE 2% (20 MG/ML) 5 ML SYRINGE
INTRAMUSCULAR | Status: DC | PRN
  Administered 2016-06-25: 100 mg via INTRAVENOUS

## 2016-06-25 MED ORDER — HEPARIN (PORCINE) IN NACL 2-0.9 UNIT/ML-% IJ SOLN
INTRAMUSCULAR | Status: AC
  Filled 2016-06-25: qty 1000

## 2016-06-25 MED ORDER — ETOMIDATE 2 MG/ML IV SOLN
INTRAVENOUS | Status: AC
  Filled 2016-06-25: qty 10

## 2016-06-25 MED ORDER — DEXMEDETOMIDINE HCL IN NACL 400 MCG/100ML IV SOLN
0.1000 ug/kg/h | INTRAVENOUS | Status: AC
  Administered 2016-06-25: .4 ug/kg/h via INTRAVENOUS
  Filled 2016-06-25: qty 100

## 2016-06-25 MED ORDER — PHENYLEPHRINE HCL 10 MG/ML IJ SOLN
INTRAVENOUS | Status: DC | PRN
  Administered 2016-06-25: 50 ug/min via INTRAVENOUS

## 2016-06-25 MED ORDER — SUCCINYLCHOLINE CHLORIDE 20 MG/ML IJ SOLN
INTRAMUSCULAR | Status: DC | PRN
  Administered 2016-06-25: 120 mg via INTRAVENOUS

## 2016-06-25 MED ORDER — PHENYLEPHRINE HCL 10 MG/ML IJ SOLN
30.0000 ug/min | INTRAVENOUS | Status: DC
  Filled 2016-06-25: qty 2

## 2016-06-25 MED ORDER — GELATIN ABSORBABLE MT POWD
OROMUCOSAL | Status: DC | PRN
  Administered 2016-06-25 (×3): 4 mL via TOPICAL

## 2016-06-25 MED ORDER — HEPARIN SODIUM (PORCINE) 5000 UNIT/ML IJ SOLN
INTRAMUSCULAR | Status: AC
  Filled 2016-06-25: qty 1

## 2016-06-25 MED ORDER — MIDAZOLAM HCL 10 MG/2ML IJ SOLN
INTRAMUSCULAR | Status: AC
  Filled 2016-06-25: qty 2

## 2016-06-25 MED ORDER — LACTATED RINGERS IV SOLN
INTRAVENOUS | Status: DC | PRN
  Administered 2016-06-25: 20:00:00 via INTRAVENOUS

## 2016-06-25 MED ORDER — NITROGLYCERIN IN D5W 200-5 MCG/ML-% IV SOLN
2.0000 ug/min | INTRAVENOUS | Status: AC
  Administered 2016-06-25: 16.6 ug/min via INTRAVENOUS
  Filled 2016-06-25: qty 250

## 2016-06-25 MED ORDER — HEPARIN SODIUM (PORCINE) 1000 UNIT/ML IJ SOLN
INTRAMUSCULAR | Status: AC
  Filled 2016-06-25: qty 1

## 2016-06-25 MED ORDER — FENTANYL CITRATE (PF) 250 MCG/5ML IJ SOLN
INTRAMUSCULAR | Status: AC
  Filled 2016-06-25: qty 10

## 2016-06-25 MED ORDER — SODIUM CHLORIDE 0.9 % IV SOLN
INTRAVENOUS | Status: DC
  Filled 2016-06-25: qty 30

## 2016-06-25 MED ORDER — ASPIRIN 81 MG PO CHEW: 324.0000 mg | CHEWABLE_TABLET | Freq: Once | ORAL | Status: DC

## 2016-06-25 MED ORDER — DOPAMINE-DEXTROSE 3.2-5 MG/ML-% IV SOLN
0.0000 ug/kg/min | INTRAVENOUS | Status: DC
  Filled 2016-06-25: qty 250

## 2016-06-25 MED ORDER — ONDANSETRON HCL 4 MG/2ML IJ SOLN
4.0000 mg | Freq: Once | INTRAMUSCULAR | Status: AC
  Administered 2016-06-25: 4 mg via INTRAVENOUS
  Filled 2016-06-25: qty 2

## 2016-06-25 MED ORDER — ROCURONIUM BROMIDE 50 MG/5ML IV SOLN
INTRAVENOUS | Status: AC
  Filled 2016-06-25: qty 1

## 2016-06-25 MED ORDER — HEPARIN SODIUM (PORCINE) 1000 UNIT/ML IJ SOLN
INTRAMUSCULAR | Status: DC | PRN
  Administered 2016-06-25: 2000 [IU] via INTRAVENOUS

## 2016-06-25 MED ORDER — ROCURONIUM BROMIDE 10 MG/ML (PF) SYRINGE: PREFILLED_SYRINGE | INTRAVENOUS | Status: DC | PRN

## 2016-06-25 MED ORDER — HEPARIN (PORCINE) IN NACL 2-0.9 UNIT/ML-% IJ SOLN
INTRAMUSCULAR | Status: DC | PRN
  Administered 2016-06-25: 1000 mL via INTRA_ARTERIAL

## 2016-06-25 MED ORDER — FENTANYL CITRATE (PF) 250 MCG/5ML IJ SOLN
INTRAMUSCULAR | Status: AC
  Filled 2016-06-25: qty 20

## 2016-06-25 MED ORDER — SODIUM CHLORIDE 0.9 % IV SOLN
INTRAVENOUS | Status: AC
  Administered 2016-06-25: 69.8 mL/h via INTRAVENOUS
  Filled 2016-06-25: qty 40

## 2016-06-25 MED ORDER — HEPARIN SODIUM (PORCINE) 5000 UNIT/ML IJ SOLN: 4000.0000 [IU] | Freq: Once | INTRAMUSCULAR | Status: DC

## 2016-06-25 MED ORDER — PROPOFOL 10 MG/ML IV BOLUS
INTRAVENOUS | Status: AC
  Filled 2016-06-25: qty 20

## 2016-06-25 MED ORDER — MIDAZOLAM HCL 5 MG/5ML IJ SOLN
INTRAMUSCULAR | Status: DC | PRN
  Administered 2016-06-25 – 2016-06-26 (×5): 2 mg via INTRAVENOUS
  Administered 2016-06-26: 4 mg via INTRAVENOUS

## 2016-06-25 MED ORDER — PROTAMINE SULFATE 10 MG/ML IV SOLN
INTRAVENOUS | Status: AC
  Filled 2016-06-25: qty 25

## 2016-06-25 MED ORDER — THROMBIN 20000 UNITS EX SOLR
CUTANEOUS | Status: AC
  Filled 2016-06-25: qty 20000

## 2016-06-25 MED ORDER — HEPARIN BOLUS VIA INFUSION
4000.0000 [IU] | Freq: Once | INTRAVENOUS | Status: DC
  Filled 2016-06-25: qty 4000

## 2016-06-25 MED ORDER — VERAPAMIL HCL 2.5 MG/ML IV SOLN
INTRAVENOUS | Status: AC
  Filled 2016-06-25: qty 2

## 2016-06-25 MED ORDER — BIVALIRUDIN 250 MG IV SOLR
INTRAVENOUS | Status: AC
  Filled 2016-06-25: qty 250

## 2016-06-25 MED ORDER — HEPARIN SODIUM (PORCINE) 1000 UNIT/ML IJ SOLN
INTRAMUSCULAR | Status: DC | PRN
  Administered 2016-06-25: 40000 [IU] via INTRAVENOUS

## 2016-06-25 MED ORDER — SODIUM CHLORIDE 0.9 % IV SOLN
INTRAVENOUS | Status: DC
Start: 2016-06-25 — End: 2016-06-26

## 2016-06-25 MED ORDER — THROMBIN 20000 UNITS EX SOLR
CUTANEOUS | Status: DC | PRN
  Administered 2016-06-25: 20000 [IU] via TOPICAL

## 2016-06-25 MED ORDER — THROMBIN 20000 UNITS EX KIT
PACK | CUTANEOUS | Status: AC
  Filled 2016-06-25: qty 1

## 2016-06-25 MED ORDER — HEPARIN SODIUM (PORCINE) 5000 UNIT/ML IJ SOLN
4000.0000 [IU] | Freq: Once | INTRAMUSCULAR | Status: AC
  Administered 2016-06-25: 4000 [IU] via INTRAVENOUS

## 2016-06-25 MED ORDER — EPINEPHRINE HCL 1 MG/ML IJ SOLN
0.0000 ug/min | INTRAVENOUS | Status: DC
  Filled 2016-06-25: qty 4

## 2016-06-25 MED ORDER — DEXTROSE 5 % IV SOLN
1.5000 g | INTRAVENOUS | Status: AC
  Administered 2016-06-25: 1.5 g via INTRAVENOUS
  Filled 2016-06-25: qty 1.5

## 2016-06-25 MED ORDER — ROCURONIUM BROMIDE 100 MG/10ML IV SOLN: INTRAVENOUS | Status: DC | PRN

## 2016-06-25 MED ORDER — PAPAVERINE HCL 30 MG/ML IJ SOLN
INTRAMUSCULAR | Status: AC
  Administered 2016-06-25: 500 mL
  Filled 2016-06-25: qty 2.5

## 2016-06-25 MED ORDER — IOPAMIDOL (ISOVUE-370) INJECTION 76%
INTRAVENOUS | Status: AC
  Filled 2016-06-25: qty 125

## 2016-06-25 MED ORDER — 0.9 % SODIUM CHLORIDE (POUR BTL) OPTIME
TOPICAL | Status: DC | PRN
  Administered 2016-06-25: 5000 mL

## 2016-06-25 MED ORDER — VERAPAMIL HCL 2.5 MG/ML IV SOLN
INTRA_ARTERIAL | Status: DC | PRN
  Administered 2016-06-25: 19:00:00 via INTRA_ARTERIAL

## 2016-06-25 MED ORDER — HEMOSTATIC AGENTS (NO CHARGE) OPTIME
TOPICAL | Status: DC | PRN
  Administered 2016-06-25 (×5): 1

## 2016-06-25 MED ORDER — SUCCINYLCHOLINE CHLORIDE 200 MG/10ML IV SOSY
PREFILLED_SYRINGE | INTRAVENOUS | Status: AC
  Filled 2016-06-25: qty 10

## 2016-06-25 MED ORDER — ARTIFICIAL TEARS OP OINT
TOPICAL_OINTMENT | OPHTHALMIC | Status: AC
  Filled 2016-06-25: qty 3.5

## 2016-06-25 MED ORDER — HEPARIN (PORCINE) IN NACL 100-0.45 UNIT/ML-% IJ SOLN
900.0000 [IU]/h | INTRAMUSCULAR | Status: DC
  Filled 2016-06-25 (×3): qty 250

## 2016-06-25 MED ORDER — FENTANYL CITRATE (PF) 100 MCG/2ML IJ SOLN
INTRAMUSCULAR | Status: DC | PRN
  Administered 2016-06-25: 50 ug via INTRAVENOUS
  Administered 2016-06-25: 150 ug via INTRAVENOUS
  Administered 2016-06-25: 250 ug via INTRAVENOUS
  Administered 2016-06-25: 100 ug via INTRAVENOUS
  Administered 2016-06-25: 250 ug via INTRAVENOUS
  Administered 2016-06-25 (×2): 100 ug via INTRAVENOUS
  Administered 2016-06-26: 150 ug via INTRAVENOUS
  Administered 2016-06-26: 100 ug via INTRAVENOUS

## 2016-06-25 MED ORDER — NITROGLYCERIN 1 MG/10 ML FOR IR/CATH LAB
INTRA_ARTERIAL | Status: AC
  Filled 2016-06-25: qty 10

## 2016-06-25 MED ORDER — LIDOCAINE HCL (PF) 1 % IJ SOLN
INTRAMUSCULAR | Status: DC | PRN
  Administered 2016-06-25: 5 mL via INTRADERMAL

## 2016-06-25 MED ORDER — IOPAMIDOL (ISOVUE-370) INJECTION 76%
INTRAVENOUS | Status: DC | PRN
  Administered 2016-06-25: 85 mL via INTRA_ARTERIAL

## 2016-06-25 MED ORDER — POTASSIUM CHLORIDE 2 MEQ/ML IV SOLN
80.0000 meq | INTRAVENOUS | Status: DC
  Filled 2016-06-25: qty 40

## 2016-06-25 MED ORDER — DEXTROSE 5 % IV SOLN
750.0000 mg | INTRAVENOUS | Status: AC
  Administered 2016-06-26: 750 mg via INTRAVENOUS
  Filled 2016-06-25: qty 750

## 2016-06-25 MED ORDER — LIDOCAINE 2% (20 MG/ML) 5 ML SYRINGE
INTRAMUSCULAR | Status: AC
  Filled 2016-06-25: qty 5

## 2016-06-25 MED ORDER — NITROGLYCERIN 0.4 MG SL SUBL
0.4000 mg | SUBLINGUAL_TABLET | SUBLINGUAL | Status: DC | PRN
  Administered 2016-06-25 (×2): 0.4 mg via SUBLINGUAL
  Filled 2016-06-25: qty 1

## 2016-06-25 MED ORDER — ETOMIDATE 2 MG/ML IV SOLN
INTRAVENOUS | Status: DC | PRN
  Administered 2016-06-25: 20 mg via INTRAVENOUS

## 2016-06-25 MED ORDER — ASPIRIN 81 MG PO CHEW
324.0000 mg | CHEWABLE_TABLET | Freq: Once | ORAL | Status: AC
  Administered 2016-06-25: 324 mg via ORAL

## 2016-06-25 MED ORDER — ASPIRIN 81 MG PO CHEW
CHEWABLE_TABLET | ORAL | Status: AC
  Filled 2016-06-25: qty 4

## 2016-06-25 MED ORDER — MORPHINE SULFATE (PF) 2 MG/ML IV SOLN
2.0000 mg | INTRAVENOUS | Status: DC | PRN
  Administered 2016-06-25: 2 mg via INTRAVENOUS
  Filled 2016-06-25: qty 1

## 2016-06-25 MED ORDER — ROCURONIUM BROMIDE 100 MG/10ML IV SOLN
INTRAVENOUS | Status: DC | PRN
  Administered 2016-06-25: 20 mg via INTRAVENOUS
  Administered 2016-06-25: 10 mg via INTRAVENOUS
  Administered 2016-06-25: 20 mg via INTRAVENOUS
  Administered 2016-06-25: 50 mg via INTRAVENOUS

## 2016-06-25 SURGICAL SUPPLY — 95 items
ADH SKN CLS APL DERMABOND .7 (GAUZE/BANDAGES/DRESSINGS) ×1
BAG DECANTER FOR FLEXI CONT (MISCELLANEOUS) ×3 IMPLANT
BANDAGE ELASTIC 4 VELCRO ST LF (GAUZE/BANDAGES/DRESSINGS) ×3 IMPLANT
BANDAGE ELASTIC 6 VELCRO ST LF (GAUZE/BANDAGES/DRESSINGS) ×3 IMPLANT
BASKET HEART  (ORDER IN 25'S) (MISCELLANEOUS) ×1
BASKET HEART (ORDER IN 25'S) (MISCELLANEOUS) ×1
BASKET HEART (ORDER IN 25S) (MISCELLANEOUS) ×1 IMPLANT
BLADE 11 SAFETY STRL DISP (BLADE) ×3 IMPLANT
BLADE STERNUM SYSTEM 6 (BLADE) ×3 IMPLANT
BNDG GAUZE ELAST 4 BULKY (GAUZE/BANDAGES/DRESSINGS) ×3 IMPLANT
CANISTER SUCTION 2500CC (MISCELLANEOUS) ×3 IMPLANT
CATH ROBINSON RED A/P 18FR (CATHETERS) ×6 IMPLANT
CATH THORACIC 28FR (CATHETERS) ×6 IMPLANT
CATH THORACIC 36FR (CATHETERS) ×3 IMPLANT
CATH THORACIC 36FR RT ANG (CATHETERS) ×3 IMPLANT
CRADLE DONUT ADULT HEAD (MISCELLANEOUS) ×3 IMPLANT
DERMABOND ADVANCED (GAUZE/BANDAGES/DRESSINGS) ×2
DERMABOND ADVANCED .7 DNX12 (GAUZE/BANDAGES/DRESSINGS) ×1 IMPLANT
DRAPE CARDIOVASCULAR INCISE (DRAPES) ×3
DRAPE SLUSH/WARMER DISC (DRAPES) ×3 IMPLANT
DRAPE SRG 135X102X78XABS (DRAPES) ×1 IMPLANT
DRSG COVADERM 4X14 (GAUZE/BANDAGES/DRESSINGS) ×3 IMPLANT
ELECT CAUTERY BLADE 6.4 (BLADE) ×3 IMPLANT
ELECT REM PT RETURN 9FT ADLT (ELECTROSURGICAL) ×6
ELECTRODE REM PT RTRN 9FT ADLT (ELECTROSURGICAL) ×2 IMPLANT
FELT TEFLON 1X6 (MISCELLANEOUS) ×3 IMPLANT
GAUZE SPONGE 4X4 12PLY STRL (GAUZE/BANDAGES/DRESSINGS) ×6 IMPLANT
GLOVE BIO SURGEON STRL SZ 6 (GLOVE) ×6 IMPLANT
GLOVE BIO SURGEON STRL SZ 6.5 (GLOVE) ×16 IMPLANT
GLOVE BIO SURGEON STRL SZ7 (GLOVE) IMPLANT
GLOVE BIO SURGEON STRL SZ7.5 (GLOVE) IMPLANT
GLOVE BIO SURGEONS STRL SZ 6.5 (GLOVE) ×8
GLOVE BIOGEL PI IND STRL 6 (GLOVE) ×2 IMPLANT
GLOVE BIOGEL PI IND STRL 6.5 (GLOVE) ×6 IMPLANT
GLOVE BIOGEL PI IND STRL 7.0 (GLOVE) IMPLANT
GLOVE BIOGEL PI INDICATOR 6 (GLOVE) ×4
GLOVE BIOGEL PI INDICATOR 6.5 (GLOVE) ×12
GLOVE BIOGEL PI INDICATOR 7.0 (GLOVE)
GLOVE EUDERMIC 7 POWDERFREE (GLOVE) ×6 IMPLANT
GLOVE ORTHO TXT STRL SZ7.5 (GLOVE) IMPLANT
GOWN STRL REUS W/ TWL LRG LVL3 (GOWN DISPOSABLE) ×4 IMPLANT
GOWN STRL REUS W/ TWL XL LVL3 (GOWN DISPOSABLE) ×1 IMPLANT
GOWN STRL REUS W/TWL LRG LVL3 (GOWN DISPOSABLE) ×12
GOWN STRL REUS W/TWL XL LVL3 (GOWN DISPOSABLE) ×3
HEMOSTAT POWDER SURGIFOAM 1G (HEMOSTASIS) ×9 IMPLANT
HEMOSTAT SURGICEL 2X14 (HEMOSTASIS) ×3 IMPLANT
KIT BASIN OR (CUSTOM PROCEDURE TRAY) ×3 IMPLANT
KIT CATH CPB BARTLE (MISCELLANEOUS) ×3 IMPLANT
KIT ROOM TURNOVER OR (KITS) ×3 IMPLANT
KIT SUCTION CATH 14FR (SUCTIONS) ×3 IMPLANT
KIT VASOVIEW 6 PRO VH 2400 (KITS) ×3 IMPLANT
NS IRRIG 1000ML POUR BTL (IV SOLUTION) ×15 IMPLANT
PACK OPEN HEART (CUSTOM PROCEDURE TRAY) ×3 IMPLANT
PAD ARMBOARD 7.5X6 YLW CONV (MISCELLANEOUS) ×6 IMPLANT
PAD ELECT DEFIB RADIOL ZOLL (MISCELLANEOUS) ×3 IMPLANT
PENCIL BUTTON HOLSTER BLD 10FT (ELECTRODE) ×3 IMPLANT
PUNCH AORTIC ROTATE  4.5MM 8IN (MISCELLANEOUS) ×3 IMPLANT
PUNCH AORTIC ROTATE 4.5MM 8IN (MISCELLANEOUS) ×3 IMPLANT
SET CARDIOPLEGIA MPS 5001102 (MISCELLANEOUS) ×3 IMPLANT
SPONGE GAUZE 4X4 12PLY STER LF (GAUZE/BANDAGES/DRESSINGS) ×3 IMPLANT
SPONGE LAP 4X18 X RAY DECT (DISPOSABLE) ×3 IMPLANT
SUT BONE WAX W31G (SUTURE) ×3 IMPLANT
SUT MNCRL AB 4-0 PS2 18 (SUTURE) ×6 IMPLANT
SUT PROLENE 3 0 SH DA (SUTURE) IMPLANT
SUT PROLENE 3 0 SH1 36 (SUTURE) ×3 IMPLANT
SUT PROLENE 4 0 RB 1 (SUTURE)
SUT PROLENE 4 0 SH DA (SUTURE) IMPLANT
SUT PROLENE 4-0 RB1 .5 CRCL 36 (SUTURE) IMPLANT
SUT PROLENE 5 0 C 1 36 (SUTURE) IMPLANT
SUT PROLENE 6 0 C 1 30 (SUTURE) IMPLANT
SUT PROLENE 7 0 BV 1 (SUTURE) IMPLANT
SUT PROLENE 7 0 BV1 MDA (SUTURE) ×9 IMPLANT
SUT SILK  1 MH (SUTURE) ×4
SUT SILK 1 MH (SUTURE) ×2 IMPLANT
SUT STEEL 6MS V (SUTURE) ×3 IMPLANT
SUT STEEL SZ 6 DBL 3X14 BALL (SUTURE) IMPLANT
SUT VIC AB 1 CTX 36 (SUTURE) ×6
SUT VIC AB 1 CTX36XBRD ANBCTR (SUTURE) ×2 IMPLANT
SUT VIC AB 2-0 CT1 27 (SUTURE) ×6
SUT VIC AB 2-0 CT1 TAPERPNT 27 (SUTURE) ×2 IMPLANT
SUT VIC AB 2-0 CTX 27 (SUTURE) IMPLANT
SUT VIC AB 3-0 SH 27 (SUTURE)
SUT VIC AB 3-0 SH 27X BRD (SUTURE) IMPLANT
SUT VIC AB 3-0 X1 27 (SUTURE) IMPLANT
SUT VICRYL 4-0 PS2 18IN ABS (SUTURE) IMPLANT
SUTURE E-PAK OPEN HEART (SUTURE) ×3 IMPLANT
SYSTEM SAHARA CHEST DRAIN ATS (WOUND CARE) ×3 IMPLANT
TAPE CLOTH SURG 4X10 WHT LF (GAUZE/BANDAGES/DRESSINGS) ×3 IMPLANT
TAPE PAPER MEDFIX 1IN X 10YD (GAUZE/BANDAGES/DRESSINGS) ×3 IMPLANT
TOWEL OR 17X24 6PK STRL BLUE (TOWEL DISPOSABLE) ×3 IMPLANT
TOWEL OR 17X26 10 PK STRL BLUE (TOWEL DISPOSABLE) ×3 IMPLANT
TRAY FOLEY IC TEMP SENS 16FR (CATHETERS) ×3 IMPLANT
TUBING INSUFFLATION (TUBING) ×3 IMPLANT
UNDERPAD 30X30 INCONTINENT (UNDERPADS AND DIAPERS) ×3 IMPLANT
WATER STERILE IRR 1000ML POUR (IV SOLUTION) ×6 IMPLANT

## 2016-06-25 SURGICAL SUPPLY — 12 items
CATH INFINITI 5 FR JL3.5 (CATHETERS) ×2 IMPLANT
CATH INFINITI 5FR ANG PIGTAIL (CATHETERS) ×2 IMPLANT
CATH OPTITORQUE TIG 4.0 5F (CATHETERS) ×2 IMPLANT
DEVICE RAD COMP TR BAND LRG (VASCULAR PRODUCTS) ×2 IMPLANT
GLIDESHEATH SLEND A-KIT 6F 22G (SHEATH) ×2 IMPLANT
KIT HEART LEFT (KITS) ×2 IMPLANT
PACK CARDIAC CATHETERIZATION (CUSTOM PROCEDURE TRAY) ×2 IMPLANT
SYR MEDRAD MARK V 150ML (SYRINGE) ×2 IMPLANT
TRANSDUCER W/STOPCOCK (MISCELLANEOUS) ×2 IMPLANT
TUBING CIL FLEX 10 FLL-RA (TUBING) ×2 IMPLANT
WIRE HI TORQ VERSACORE-J 145CM (WIRE) ×2 IMPLANT
WIRE SAFE-T 1.5MM-J .035X260CM (WIRE) ×2 IMPLANT

## 2016-06-25 NOTE — Anesthesia Preprocedure Evaluation (Addendum)
Anesthesia Evaluation  Patient identified by MRN, date of birth, ID band Patient awake    Reviewed: Allergy & Precautions, NPO status , Patient's Chart, lab work & pertinent test resultsPreop documentation limited or incomplete due to emergent nature of procedure.  Airway Mallampati: II  TM Distance: >3 FB Neck ROM: Full    Dental  (+) Teeth Intact, Dental Advisory Given, Poor Dentition   Pulmonary neg pulmonary ROS,    Pulmonary exam normal breath sounds clear to auscultation       Cardiovascular + CAD ([place], 3 vessel disease by cath) and + Past MI  Normal cardiovascular exam Rhythm:Regular Rate:Normal  EF ~30%, anterior wall down per cath   Neuro/Psych  Headaches, negative psych ROS   GI/Hepatic Neg liver ROS, GERD  Medicated,  Endo/Other  negative endocrine ROS  Renal/GU negative Renal ROS     Musculoskeletal negative musculoskeletal ROS (+)   Abdominal   Peds  Hematology  (+) Blood dyscrasia, anemia ,   Anesthesia Other Findings Day of surgery medications reviewed with the patient.  Reproductive/Obstetrics                            Anesthesia Physical Anesthesia Plan  ASA: IV and emergent  Anesthesia Plan: General   Post-op Pain Management:    Induction: Intravenous  Airway Management Planned: Oral ETT  Additional Equipment: Arterial line, CVP, PA Cath, TEE and Ultrasound Guidance Line Placement  Intra-op Plan:   Post-operative Plan: Post-operative intubation/ventilation  Informed Consent: I have reviewed the patients History and Physical, chart, labs and discussed the procedure including the risks, benefits and alternatives for the proposed anesthesia with the patient or authorized representative who has indicated his/her understanding and acceptance.   Dental advisory given  Plan Discussed with: CRNA  Anesthesia Plan Comments: (Risks/benefits of general anesthesia  discussed with patient including risk of damage to teeth, lips, gum, and tongue, nausea/vomiting, allergic reactions to medications, and the possibility of heart attack, stroke and death.  All patient questions answered.  Patient wishes to proceed.  Emergent chart review. Patient being brought directly to OR from Cath Lab.)        Anesthesia Quick Evaluation

## 2016-06-25 NOTE — Brief Op Note (Addendum)
06/25/2016  10:33 PM  PATIENT:  Benjamin Robbins  60 y.o. male  PRE-OPERATIVE DIAGNOSIS:  1. S/p STEMI 2. Coronary artery disease  POST-OPERATIVE DIAGNOSIS:  1. S/p STEMI 2.Coronary Artery Disease  PROCEDURE: EMERGENT MEDIAN STERNOTOMY for CORONARY ARTERY BYPASS GRAFTING (CABG) x (LIMA to LAD, SVG to OM, SVG SEQUENTIALLY to PDA and PLB) with EVH from RIGHT THIGH and LOWER LEG and open harvest left internal mammary artery  SURGEON:  Surgeon(s) and Role:    * Gaye Pollack, MD - Primary  PHYSICIAN ASSISTANT: Lars Pinks PA-C  ANESTHESIA:   general  EBL:  Total I/O In: -  Out: 900 [Urine:900]  DRAINS: Chest tubes placed in the mediastinal and pleural spaces   COUNTS:  YES  DICTATION: .Dragon Dictation  PLAN OF CARE: Admit to inpatient   PATIENT DISPOSITION:  ICU - intubated and hemodynamically stable.   Delay start of Pharmacological VTE agent (>24hrs) due to surgical blood loss or risk of bleeding: yes   BASELINE WEIGHT: 73.5 kg

## 2016-06-25 NOTE — Consult Note (Signed)
[address]       [place] [address]             [phone]      Cardiothoracic Surgery Consultation  Reason for Consult: High grade left main and multi-vessel coronary artery disease Referring Physician: Dr. Quay Burow  Benjamin Robbins is an 60 y.o. male.  HPI:   The patient is a 60 year old gentleman whose only significant medical history is of prostate cancer who developed acute chest pain today at work in [place] [employer] here. It was 5/10 and he was pale and diaphoretic and taken to the ER where ECG showed an anterior STEMI. He became nauseated and mildly short of breath. He was given ASA, SL NTG x 2 and IV heparin with improvement in pain to 3/10. ECG improved but still abnormal and taken to cath lab. He has 80% ostial LM with dampening with 72F catheter as well as severe 3-vessel disease with subtotal LAD and distal RCA. LVEF is decreased with anteroapical severe hypokinesis. He is still having mild chest pain in the cath lab but has been hemodynamically stable.  Past Medical History:  Diagnosis Date  . Cancer - prostate   . GERD (gastroesophageal reflux disease)   . Migraine     Past Surgical History:  Procedure Laterality Date  . MOLE REMOVAL     off neck  . PROSTATECTOMY  11-2008    Family History  Problem Relation Age of Onset  . Pancreatic cancer Mother   . Diabetes Mother   . Emphysema Father   . Colon polyps Brother   . CAD Brother 77    MI, he was a heavy smoker  . Colon cancer Neg Hx     Social History:  reports that he has never smoked. He has never used smokeless tobacco. He reports that he does not drink alcohol or use drugs.  Allergies: No Known Allergies  Medications:  I have reviewed the patient's current medications. Prior to Admission:  Prescriptions Prior to Admission  Medication Sig Dispense Refill Last Dose  . acetaminophen (TYLENOL) 500 MG tablet Take 1,000 mg by mouth every 6 (six) hours as needed for moderate  pain.    Unknown at Unknown time  . aspirin 81 MG tablet Take 81 mg by mouth daily.   Unknown at Unknown time  . cephALEXin (KEFLEX) 500 MG capsule Take 1 capsule (500 mg total) by mouth 3 (three) times daily. 30 capsule 0   . dextromethorphan-guaiFENesin (ROBITUSSIN-DM) 10-100 MG/5ML liquid Take 5 mLs by mouth every 4 (four) hours as needed for cough.   Unknown at Unknown time  . esomeprazole (NEXIUM) 40 MG capsule Take 40 mg by mouth daily as needed (reflux).   Unknown at Unknown time  . ibuprofen (ADVIL,MOTRIN) 200 MG tablet Take 400 mg by mouth every 6 (six) hours as needed for moderate pain.    Unknown at Unknown time  . Multiple Vitamin (MULTIVITAMINS PO) Take 1 tablet by mouth daily.   Unknown at Unknown time  . topiramate (TOPAMAX) 100 MG tablet Take 100 mg by mouth at bedtime.   Unknown at Unknown time   Scheduled: . aminocaproic acid (AMICAR) for OHS   Intravenous To OR  . aspirin      . [MAR Hold] aspirin  324 mg Oral Once  . cefUROXime (ZINACEF)  IV  1.5 g Intravenous To OR  . cefUROXime (ZINACEF)  IV  750 mg Intravenous To OR  .  dexmedetomidine  0.1-0.7 mcg/kg/hr Intravenous To OR  . DOPamine  0-10 mcg/kg/min Intravenous To OR  . epinephrine  0-10 mcg/min Intravenous To OR  . heparin-papaverine-plasmalyte irrigation   Irrigation To OR  . heparin 30,000 units/NS 1000 mL solution for CELLSAVER   Other To OR  . heparin      . insulin (NOVOLIN-R) infusion   Intravenous To OR  . magnesium sulfate  40 mEq Other To OR  . nitroGLYCERIN  2-200 mcg/min Intravenous To OR  . phenylephrine (NEO-SYNEPHRINE) Adult infusion  30-200 mcg/min Intravenous To OR  . potassium chloride  80 mEq Other To OR  . vancomycin  1,250 mg Intravenous To OR   Continuous: . sodium chloride    . heparin    . heparin     [id number], heparin, iopamidol, lidocaine (PF), [MAR Hold]  morphine injection, [MAR Hold] nitroGLYCERIN, Radial Cocktail (Verapamil 5 mg, NTG, Lidocaine) Anti-infectives    Start      Dose/Rate Route Frequency Ordered Stop   06/25/16 1930  vancomycin (VANCOCIN) 1,250 mg in sodium chloride 0.9 % 250 mL IVPB     1,250 mg 166.7 mL/hr over 90 Minutes Intravenous To Surgery 06/25/16 1916 06/26/16 1930   06/25/16 1930  cefUROXime (ZINACEF) 1.5 g in dextrose 5 % 50 mL IVPB     1.5 g 100 mL/hr over 30 Minutes Intravenous To Surgery 06/25/16 1916 06/26/16 1930   06/25/16 1930  cefUROXime (ZINACEF) 750 mg in dextrose 5 % 50 mL IVPB     750 mg 100 mL/hr over 30 Minutes Intravenous To Surgery 06/25/16 1916 06/26/16 1930      Results for orders placed or performed during the hospital encounter of 06/25/16 (from the past 48 hour(s))  I-Stat Troponin, ED (not at [facility])     Status: None   Collection Time: 06/25/16  6:01 PM  Result Value Ref Range   Troponin i, poc 0.02 0.00 - 0.08 ng/mL   Comment 3            Comment: Due to the release kinetics of cTnI, a negative result within the first hours of the onset of symptoms does not rule out myocardial infarction with certainty. If myocardial infarction is still suspected, repeat the test at appropriate intervals.   I-Stat Chem 8, ED     Status: Abnormal   Collection Time: 06/25/16  6:03 PM  Result Value Ref Range   Sodium 141 135 - 145 mmol/L   Potassium 3.5 3.5 - 5.1 mmol/L   Chloride 105 101 - 111 mmol/L   BUN 15 6 - 20 mg/dL   Creatinine, Ser 1.00 0.61 - 1.24 mg/dL   Glucose, Bld 123 (H) 65 - 99 mg/dL   Calcium, Ion 1.12 1.12 - 1.23 mmol/L   TCO2 21 0 - 100 mmol/L   Hemoglobin 11.9 (L) 13.0 - 17.0 g/dL   HCT 35.0 (L) 39.0 - 52.0 %  I-STAT 3, arterial blood gas (G3+)     Status: Abnormal   Collection Time: 06/25/16  6:56 PM  Result Value Ref Range   pH, Arterial 7.393 7.350 - 7.450   pCO2 arterial 29.0 (L) 35.0 - 45.0 mmHg   pO2, Arterial 147.0 (H) 80.0 - 100.0 mmHg   Bicarbonate 17.6 (L) 20.0 - 24.0 mEq/L   TCO2 19 0 - 100 mmol/L   O2 Saturation 99.0 %   Acid-base deficit 6.0 (H) 0.0 - 2.0 mmol/L   Patient  temperature HIDE    Sample type ARTERIAL  No results found.  Review of Systems  Constitutional: Negative.   HENT: Negative.   Eyes: Negative.   Respiratory: Negative.   Cardiovascular: Positive for chest pain.       While mowing his grass recently  Gastrointestinal: Negative.   Genitourinary: Negative.   Musculoskeletal: Negative.   Skin: Negative.   Neurological: Negative.   Endo/Heme/Allergies: Negative.   Psychiatric/Behavioral: Negative.    Blood pressure 125/82, pulse 71, resp. rate 11, height 6' (1.829 m), weight 73.5 kg (162 lb), SpO2 100 %. Physical Exam  Constitutional: He is oriented to person, place, and time. He appears well-developed and well-nourished. No distress.  Cardiovascular: Normal rate, regular rhythm, normal heart sounds and intact distal pulses.   No murmur heard. Respiratory: Effort normal. He has no wheezes. He has no rales.  Neurological: He is alert and oriented to person, place, and time.  Psychiatric: He has a normal mood and affect.    Assessment/Plan:  He has high grade left main and severe 3-vessel coronary artery disease presenting with acute  Anterior STEMI. He needs emergent CABG. I discussed the operative procedure with the patient and his wife including alternatives, benefits and risks; including but not limited to bleeding, blood transfusion, infection, stroke, myocardial infarction, graft failure, heart block requiring a permanent pacemaker, organ dysfunction, and death.  Benjamin Robbins understands and agrees to proceed.    Gaye Pollack 06/25/2016, 7:22 PM

## 2016-06-25 NOTE — ED Notes (Signed)
Placed the patient on the zoll for transport and got patient clothes off

## 2016-06-25 NOTE — H&P (Signed)
History and Physical   Patient ID: Benjamin Robbins MRN: [id number], DOB/AGE: 1956/03/01 59 y.o. Date of Encounter: 06/25/2016  Primary Physician:  Benjamin Crutch, MD Primary Cardiologist: new  Chief Complaint:  STEMI  HPI: Benjamin Robbins is a 60 y.o. male with a history of GERD, no hx DM, HTN, HLD, FH premature CAD.   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 He developed SSCP today with moderate activity. Heaviness, 5/10. He was not initially nauseated, SOB or diaphoretic. He spoke with co-workers who noted him to be pale and diaphoretic. He was taken to the ER, where his ECG was consistent with a lateral STEMI. He became nauseated and was mildly SOB. He has never had pain like this before.  He was given ASA 324 mg, IV Heparin, SL NTG x 2. His pain improved to a 3/10. His ECG improved but is still abnormal. He was given morphine and Zofran. His pain was then a 2/10. Because he was still having discomfort and his ECG, although improved was still abnormal, he was taken directly to the cath lab.   Past Medical History:  Diagnosis Date  . Cancer - prostate   . GERD (gastroesophageal reflux disease)   . Migraine     Surgical History:  Past Surgical History:  Procedure Laterality Date  . MOLE REMOVAL     off neck  . PROSTATECTOMY  11-2008     I have reviewed the patient's current medications. Prior to Admission medications   Medication Sig Start Date End Date Taking? Authorizing Provider  acetaminophen (TYLENOL) 500 MG tablet Take 1,000 mg by mouth every 6 (six) hours as needed for moderate pain.     Historical Provider, MD  aspirin 81 MG tablet Take 81 mg by mouth daily.    Historical Provider, MD  cephALEXin (KEFLEX) 500 MG capsule Take 1 capsule (500 mg total) by mouth 3 (three) times daily. 10/22/14   Harden Mo, MD  dextromethorphan-guaiFENesin (ROBITUSSIN-DM) 10-100 MG/5ML liquid Take 5 mLs by mouth every 4 (four) hours as needed for cough.    Historical Provider,  MD  esomeprazole (NEXIUM) 40 MG capsule Take 40 mg by mouth daily as needed (reflux).    Historical Provider, MD  ibuprofen (ADVIL,MOTRIN) 200 MG tablet Take 400 mg by mouth every 6 (six) hours as needed for moderate pain.     Historical Provider, MD  Multiple Vitamin (MULTIVITAMINS PO) Take 1 tablet by mouth daily.    Historical Provider, MD  topiramate (TOPAMAX) 100 MG tablet Take 100 mg by mouth at bedtime.    Historical Provider, MD   Scheduled Meds: . aspirin      . [MAR Hold] aspirin  324 mg Oral Once  . heparin       Continuous Infusions: . sodium chloride    . heparin     PRN Meds:.[MAR Hold]  morphine injection, [MAR Hold] nitroGLYCERIN, Radial Cocktail (Verapamil 5 mg, NTG, Lidocaine)  Allergies: No Known Allergies  Social History   Social History  . Marital status: Married    Spouse name: [name]  . Number of children: 3  . Years of education: [name]   Occupational History  . [place] in [place] [employer] Top[place]  . Smoking status: Never Smoker  . Smokeless tobacco: Never Used  . Alcohol use No  . Drug use: No  . Sexual activity: Not on file   Other Topics Concern  . Not on file  Social History Narrative   Daily caffeine    Family History  Problem Relation Age of Onset  . Pancreatic cancer Mother   . Diabetes Mother   . Emphysema Father   . Colon polyps Brother   . CAD Brother 35    MI, he was a heavy smoker  . Colon cancer Neg Hx    Family Status  Relation Status  . Mother Deceased  . Father   . Brother Alive  . Neg Hx     Review of Systems:   Full 14-point review of systems otherwise negative except as noted above.  Physical Exam: Blood pressure (!) 149/110, pulse 74, resp. rate 18, height 6' (1.829 m), weight 162 lb (73.5 kg), SpO2 100 %. General: Well developed, well nourished,male in no acute distress. Head: Normocephalic, atraumatic, sclera non-icteric, no xanthomas, nares are without discharge. Dentition: fair Neck: No  carotid bruits. JVD not elevated. No thyromegally Lungs: Good expansion bilaterally. without wheezes or rhonchi.  Heart: Regular rate and rhythm with S1 S2.  No S3 or S4.  No murmur, no rubs, or gallops appreciated. Abdomen: Soft, non-tender, non-distended with normoactive bowel sounds. No hepatomegaly. No rebound/guarding. No obvious abdominal masses. Msk:  Strength and tone appear normal for age. No joint deformities or effusions, no spine or costo-vertebral angle tenderness. Extremities: No clubbing or cyanosis. No edema.  Distal pedal pulses are 2+ in 4 extrem Neuro: Alert and oriented X 3. Moves all extremities spontaneously. No focal deficits noted. Psych:  Responds to questions appropriately with a normal affect. Skin: No rashes or lesions noted  Labs:   Lab Results  Component Value Date   WBC 10.7 (H) 04/10/2014   HGB 11.9 (L) 2016-07-11   HCT 35.0 (L) 07-11-2016   MCV 83.7 04/10/2014   PLT 290 04/10/2014     Recent Labs Lab 07-11-16 1803  NA 141  K 3.5  CL 105  BUN 15  CREATININE 1.00  GLUCOSE 123*    Recent Labs  07-11-16 1801  TROPIPOC 0.02   Radiology/Studies: No results found.  ECG: 07/12/2023 Initial ECG w/ ST elevation in V2-5 and inferolateral reciprocal changes, c/w Anterior STEMI  ASSESSMENT AND PLAN:  Principal Problem:   ST elevation myocardial infarction (STEMI) of anterior wall, initial episode of care [facility] - pt being taken to the cath lab, with further evaluation and treatment depending on the results. He will be screened for CRFs, started on high-dose statin, BB and other meds as indicated. - risks and benefits of the procedure were discussed with the patient who indicated understanding and agreed to proceed.  Active Problems:   GERD (gastroesophageal reflux disease) - we will start Protonix and watch for symptoms   Signed, Benjamin Robbins Jul 11, 2016 6:42 PM Beeper [phone]   Agree with note by Benjamin Ferries PA-C  60 year old  gentleman with a premature family history of heart disease developed substernal chest pain at 5:30 this afternoon. 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 He was brought to the emergency room where EKG showed anterior ST segment elevation. He was treated with aspirin and heparin and was brought emergently to the Cath Lab for coronary catheterization and potential intervention.  Lorretta Harp, M.D., [place], [facility], [place] [address] [place], [place]  [address]  [phone] 2016/07/11 7:13 PM

## 2016-06-25 NOTE — Progress Notes (Signed)
ANTICOAGULATION CONSULT NOTE - Initial Consult  Pharmacy Consult for heparin Indication: chest pain/ACS  No Known Allergies  Patient Measurements: Height: 6' (182.9 cm) Weight: 162 lb (73.5 kg) IBW/kg (Calculated) : 77.6 Heparin Dosing Weight: 72 kg  Vital Signs: BP: 149/110 (07/27 1754) Pulse Rate: 74 (07/27 1754)  Labs: No results for input(s): HGB, HCT, PLT, APTT, LABPROT, INR, HEPARINUNFRC, HEPRLOWMOCWT, CREATININE, CKTOTAL, CKMB, TROPONINI in the last 72 hours.  CrCl cannot be calculated (Patient's most recent lab result is older than the maximum 21 days allowed.).  Assessment: 60 yo m presenting with CP while at work  PMH: GERD  AC: none pta, iv hep for r/o acs  Goal of Therapy:  Heparin level 0.3-0.7 units/ml Monitor platelets by anticoagulation protocol: Yes   Plan:  CODE stemi activated, labs pending Heparin 4000 unit bolus Heparin infusion 900 units/hr 0030 HL Daily HL, CBC F/U Cath plans  Levester Fresh, PharmD, BCPS, [facility] Clinical Pharmacist Pager [phone] 06/25/2016 6:05 PM

## 2016-06-25 NOTE — ED Provider Notes (Signed)
[facility] DEPT Provider Note   CSN: [id number] Arrival date & time: 06/25/16  1745  First Provider Contact:  First MD Initiated Contact with Patient 06/25/16 1751   By signing my name below, I, Evelene Croon, attest that this documentation has been prepared under the direction and in the presence of Virgel Manifold, MD . Electronically Signed: Evelene Croon, Scribe. 06/25/2016. 6:03 PM.  History   Chief Complaint Chief Complaint  Patient presents with  . Code STEMI    The history is provided by the patient. No language interpreter was used.    HPI Comments:  Benjamin Robbins is a 60 y.o. male who presents to the Emergency Department complaining of non-radiating, 5/10, central CP which began a few minutes PTA. 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 Pt reports associated lightheadedness and tingling in his LUE, nausea and 1 episode of vomiting. He denies h/o HTN, DM and  CAD. Pt notes his brother had an MI at the age of 41. He takes 81 mg daily; states he has taken his ASA for today.  No alleviating factors noted. Pt notes a few years ago he had an episode of exertional CP while mowing the lawn, however, did not follow up further. He denies SOB. Does not have a cardiologist.   Past Medical History:  Diagnosis Date  . Cancer - prostate   . GERD (gastroesophageal reflux disease)   . Migraine     There are no active problems to display for this patient.   Past Surgical History:  Procedure Laterality Date  . MOLE REMOVAL     off neck  . PROSTATECTOMY  11-2008    Home Medications    Prior to Admission medications   Medication Sig Start Date End Date Taking? Authorizing Provider  acetaminophen (TYLENOL) 500 MG tablet Take 1,000 mg by mouth every 6 (six) hours as needed for moderate pain.     Historical Provider, MD  aspirin 81 MG tablet Take 81 mg by mouth daily.    Historical Provider, MD  cephALEXin (KEFLEX)  500 MG capsule Take 1 capsule (500 mg total) by mouth 3 (three) times daily. 10/22/14   Harden Mo, MD  dextromethorphan-guaiFENesin (ROBITUSSIN-DM) 10-100 MG/5ML liquid Take 5 mLs by mouth every 4 (four) hours as needed for cough.    Historical Provider, MD  esomeprazole (NEXIUM) 40 MG capsule Take 40 mg by mouth daily as needed (reflux).    Historical Provider, MD  ibuprofen (ADVIL,MOTRIN) 200 MG tablet Take 400 mg by mouth every 6 (six) hours as needed for moderate pain.     Historical Provider, MD  Multiple Vitamin (MULTIVITAMINS PO) Take 1 tablet by mouth daily.    Historical Provider, MD  topiramate (TOPAMAX) 100 MG tablet Take 100 mg by mouth at bedtime.    Historical Provider, MD    Family History Family History  Problem Relation Age of Onset  . Pancreatic cancer Mother   . Diabetes Mother   . Emphysema Father   . Colon polyps Brother   . Colon cancer Neg Hx     Social History Social History  Substance Use Topics  . Smoking status: Never Smoker  . Smokeless tobacco: Never Used  . Alcohol use No     Allergies   Review of patient's allergies indicates no known allergies.   Review of Systems Review of Systems  Respiratory: Negative for shortness of breath.  Cardiovascular: Positive for chest pain.  Gastrointestinal: Positive for nausea and vomiting.  Neurological: Positive for numbness (Tingling).  All other systems reviewed and are negative.  Physical Exam Updated Vital Signs BP (!) 149/110 (BP Location: Left Arm)   Pulse 74   Resp 18   SpO2 100%   Physical Exam  Constitutional: He is oriented to person, place, and time. He appears well-developed and well-nourished.  HENT:  Head: Normocephalic and atraumatic.  Eyes: EOM are normal.  Neck: Normal range of motion.  Cardiovascular: Normal rate, regular rhythm, normal heart sounds and intact distal pulses.   Pulmonary/Chest: Effort normal and breath sounds normal. No respiratory distress.  Abdominal: Soft.  He exhibits no distension. There is no tenderness.  Musculoskeletal: Normal range of motion.  Neurological: He is alert and oriented to person, place, and time.  Skin: Skin is warm and dry. There is pallor.  Psychiatric: He has a normal mood and affect. Judgment normal.  Nursing note and vitals reviewed.  ED Treatments / Results  DIAGNOSTIC STUDIES:  Oxygen Saturation is 100% on RA, normal by my interpretation.    COORDINATION OF CARE:  6:00 PM Discussed treatment plan with pt at bedside and pt agreed to plan.  Labs (all labs ordered are listed, but only abnormal results are displayed) Labs Reviewed  I-STAT CHEM 8, ED - Abnormal; Notable for the following:       Result Value   Glucose, Bld 123 (*)    Hemoglobin 11.9 (*)    HCT 35.0 (*)    All other components within normal limits  HEPARIN LEVEL (UNFRACTIONATED)  HEPARIN LEVEL (UNFRACTIONATED)  CBC  I-STAT TROPOININ, ED    EKG  EKG Interpretation  Date/Time:  Thursday June 25 2016 17:49:09 EDT Ventricular Rate:  86 PR Interval:    QRS Duration: 88 QT Interval:  397 QTC Calculation: 475 R Axis:   66 Text Interpretation:  Sinus rhythm Anterior infarct, acute (LAD) ** ** ACUTE MI / STEMI ** ** Confirmed by Makaylah Oddo  MD, Imya Mance ([id number]) on 06/25/2016 6:06:09 PM       Radiology No results found.  Procedures Procedures  Medications Ordered in ED Medications  aspirin 81 MG chewable tablet (not administered)  nitroGLYCERIN (NITROSTAT) SL tablet 0.4 mg (0.4 mg Sublingual Given 06/25/16 1804)  heparin 5000 UNIT/ML injection (not administered)  heparin bolus via infusion 4,000 Units ( Intravenous Not Given 06/25/16 1808)  heparin ADULT infusion 100 units/mL (25000 units/229mL sodium chloride 0.45%) (not administered)  aspirin chewable tablet 324 mg (324 mg Oral Not Given 06/25/16 1809)  ondansetron (ZOFRAN) injection 4 mg (not administered)  0.9 %  sodium chloride infusion (not administered)  heparin injection 4,000 Units  (4,000 Units Intravenous Not Given 06/25/16 1809)  aspirin chewable tablet 324 mg (324 mg Oral Given 06/25/16 1757)  heparin injection 4,000 Units (4,000 Units Intravenous Given 06/25/16 1758)    CRITICAL CARE Performed by: Virgel Manifold, MD Total critical care time: 35 minutes Critical care time was exclusive of separately billable procedures and treating other patients. Critical care was necessary to treat or prevent imminent or life-threatening deterioration. Critical care was time spent personally by me on the following activities: development of treatment plan with patient and/or surrogate as well as nursing, discussions with consultants, evaluation of patient's response to treatment, examination of patient, obtaining history from patient or surrogate, ordering and performing treatments and interventions, ordering and review of laboratory studies, ordering and review of radiographic studies, pulse oximetry and re-evaluation of patient's condition.  Initial Impression / Assessment and Plan / ED Course  I have reviewed the triage vital signs and the nursing notes.  Pertinent labs & imaging results that were available during my care of the patient were reviewed by me and considered in my medical decision making (see chart for details).  Clinical Course    60yM with CP. Symptoms/EKG consistent with MI. Code STEMI activated. ASA, heparin, nitro for ongoing symptoms.   Final Clinical Impressions(s) / ED Diagnoses   Final diagnoses:  ST elevation myocardial infarction (STEMI), unspecified artery (HCC)    New Prescriptions New Prescriptions   No medications on file   I personally preformed the services scribed in my presence. The recorded information has been reviewed is accurate. Virgel Manifold, MD.     Virgel Manifold, MD 06/25/16 [phone]

## 2016-06-25 NOTE — ED Triage Notes (Signed)
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 States he started experiencing central chest pain with nausea/vomiting 45 minutes ago. Also c/o left arm tingling.

## 2016-06-25 NOTE — Progress Notes (Signed)
  Echocardiogram Echocardiogram Transesophageal has been performed.  Tresa Res 06/25/2016, 8:36 PM

## 2016-06-25 NOTE — Anesthesia Procedure Notes (Signed)
Central Venous Catheter Insertion Performed by: anesthesiologist Patient location: OR. Preanesthetic checklist: patient identified, IV checked, site marked, risks and benefits discussed, surgical consent, monitors and equipment checked, pre-op evaluation, timeout performed and anesthesia consent Landmarks identified PA cath was placed.Swan type and PA catheter depth:thermodilation and 50PA Cath depth:50 Procedure performed using ultrasound guided technique. Attempts: 1 Patient tolerated the procedure well with no immediate complications.

## 2016-06-25 NOTE — Anesthesia Procedure Notes (Signed)
Central Venous Catheter Insertion Performed by: anesthesiologist Patient location: OR. Preanesthetic checklist: patient identified, IV checked, site marked, risks and benefits discussed, surgical consent, monitors and equipment checked, pre-op evaluation, timeout performed and anesthesia consent Position: Trendelenburg Lidocaine 1% used for infiltration Landmarks identified Catheter size: 9 Fr MAC introducer Procedure performed using ultrasound guided technique. Attempts: 1 Following insertion, line sutured, dressing applied and Biopatch. Post procedure assessment: blood return through all ports, free fluid flow and no air. Patient tolerated the procedure well with no immediate complications.

## 2016-06-25 NOTE — Anesthesia Procedure Notes (Signed)
Procedure Name: Intubation Date/Time: 06/25/2016 8:02 PM Performed by: Valetta Fuller Pre-anesthesia Checklist: Patient identified, Emergency Drugs available, Suction available and Patient being monitored Patient Re-evaluated:Patient Re-evaluated prior to inductionOxygen Delivery Method: Circle system utilized Preoxygenation: Pre-oxygenation with 100% oxygen Intubation Type: IV induction and Rapid sequence Laryngoscope Size: Miller and 2 Grade View: Grade I Tube type: Oral Tube size: 8.0 mm Number of attempts: 1 Airway Equipment and Method: Stylet Placement Confirmation: ETT inserted through vocal cords under direct vision,  positive ETCO2 and breath sounds checked- equal and bilateral Secured at: 24 cm Tube secured with: Tape Dental Injury: Teeth and Oropharynx as per pre-operative assessment

## 2016-06-26 ENCOUNTER — Inpatient Hospital Stay (HOSPITAL_COMMUNITY): Payer: 59

## 2016-06-26 ENCOUNTER — Encounter (HOSPITAL_COMMUNITY): Payer: Self-pay | Admitting: Cardiovascular Disease

## 2016-06-26 DIAGNOSIS — I5021 Acute systolic (congestive) heart failure: Secondary | ICD-10-CM | POA: Diagnosis not present

## 2016-06-26 DIAGNOSIS — I2510 Atherosclerotic heart disease of native coronary artery without angina pectoris: Secondary | ICD-10-CM | POA: Diagnosis not present

## 2016-06-26 DIAGNOSIS — I471 Supraventricular tachycardia: Secondary | ICD-10-CM | POA: Diagnosis not present

## 2016-06-26 DIAGNOSIS — Z8546 Personal history of malignant neoplasm of prostate: Secondary | ICD-10-CM | POA: Diagnosis not present

## 2016-06-26 DIAGNOSIS — Z48812 Encounter for surgical aftercare following surgery on the circulatory system: Secondary | ICD-10-CM | POA: Diagnosis not present

## 2016-06-26 DIAGNOSIS — R918 Other nonspecific abnormal finding of lung field: Secondary | ICD-10-CM | POA: Diagnosis not present

## 2016-06-26 DIAGNOSIS — J9811 Atelectasis: Secondary | ICD-10-CM | POA: Diagnosis not present

## 2016-06-26 DIAGNOSIS — Z951 Presence of aortocoronary bypass graft: Secondary | ICD-10-CM | POA: Diagnosis not present

## 2016-06-26 DIAGNOSIS — I472 Ventricular tachycardia: Secondary | ICD-10-CM | POA: Diagnosis not present

## 2016-06-26 DIAGNOSIS — E785 Hyperlipidemia, unspecified: Secondary | ICD-10-CM | POA: Diagnosis present

## 2016-06-26 DIAGNOSIS — I2109 ST elevation (STEMI) myocardial infarction involving other coronary artery of anterior wall: Secondary | ICD-10-CM | POA: Diagnosis not present

## 2016-06-26 DIAGNOSIS — J90 Pleural effusion, not elsewhere classified: Secondary | ICD-10-CM | POA: Diagnosis not present

## 2016-06-26 DIAGNOSIS — I25110 Atherosclerotic heart disease of native coronary artery with unstable angina pectoris: Secondary | ICD-10-CM | POA: Diagnosis not present

## 2016-06-26 DIAGNOSIS — Z800 Family history of malignant neoplasm of digestive organs: Secondary | ICD-10-CM | POA: Diagnosis not present

## 2016-06-26 DIAGNOSIS — Z8249 Family history of ischemic heart disease and other diseases of the circulatory system: Secondary | ICD-10-CM | POA: Diagnosis not present

## 2016-06-26 DIAGNOSIS — K219 Gastro-esophageal reflux disease without esophagitis: Secondary | ICD-10-CM | POA: Diagnosis present

## 2016-06-26 DIAGNOSIS — I213 ST elevation (STEMI) myocardial infarction of unspecified site: Secondary | ICD-10-CM | POA: Diagnosis present

## 2016-06-26 DIAGNOSIS — I5020 Unspecified systolic (congestive) heart failure: Secondary | ICD-10-CM | POA: Diagnosis present

## 2016-06-26 DIAGNOSIS — Z7982 Long term (current) use of aspirin: Secondary | ICD-10-CM | POA: Diagnosis not present

## 2016-06-26 LAB — POCT I-STAT, CHEM 8
BUN: 10 mg/dL (ref 6–20)
BUN: 11 mg/dL (ref 6–20)
BUN: 8 mg/dL (ref 6–20)
BUN: 9 mg/dL (ref 6–20)
BUN: 9 mg/dL (ref 6–20)
BUN: 9 mg/dL (ref 6–20)
Calcium, Ion: 1 mmol/L — ABNORMAL LOW (ref 1.12–1.23)
Calcium, Ion: 1.11 mmol/L — ABNORMAL LOW (ref 1.12–1.23)
Calcium, Ion: 1.12 mmol/L (ref 1.12–1.23)
Calcium, Ion: 1.19 mmol/L (ref 1.12–1.23)
Calcium, Ion: 1.19 mmol/L (ref 1.12–1.23)
Calcium, Ion: 1.23 mmol/L (ref 1.12–1.23)
Chloride: 105 mmol/L (ref 101–111)
Chloride: 105 mmol/L (ref 101–111)
Chloride: 105 mmol/L (ref 101–111)
Chloride: 105 mmol/L (ref 101–111)
Chloride: 107 mmol/L (ref 101–111)
Chloride: 107 mmol/L (ref 101–111)
Creatinine, Ser: 0.4 mg/dL — ABNORMAL LOW (ref 0.61–1.24)
Creatinine, Ser: 0.5 mg/dL — ABNORMAL LOW (ref 0.61–1.24)
Creatinine, Ser: 0.5 mg/dL — ABNORMAL LOW (ref 0.61–1.24)
Creatinine, Ser: 0.5 mg/dL — ABNORMAL LOW (ref 0.61–1.24)
Creatinine, Ser: 0.7 mg/dL (ref 0.61–1.24)
Creatinine, Ser: 0.7 mg/dL (ref 0.61–1.24)
Glucose, Bld: 113 mg/dL — ABNORMAL HIGH (ref 65–99)
Glucose, Bld: 113 mg/dL — ABNORMAL HIGH (ref 65–99)
Glucose, Bld: 133 mg/dL — ABNORMAL HIGH (ref 65–99)
Glucose, Bld: 140 mg/dL — ABNORMAL HIGH (ref 65–99)
Glucose, Bld: 148 mg/dL — ABNORMAL HIGH (ref 65–99)
Glucose, Bld: 160 mg/dL — ABNORMAL HIGH (ref 65–99)
HCT: 23 % — ABNORMAL LOW (ref 39.0–52.0)
HCT: 26 % — ABNORMAL LOW (ref 39.0–52.0)
HCT: 26 % — ABNORMAL LOW (ref 39.0–52.0)
HCT: 27 % — ABNORMAL LOW (ref 39.0–52.0)
HCT: 28 % — ABNORMAL LOW (ref 39.0–52.0)
HCT: 32 % — ABNORMAL LOW (ref 39.0–52.0)
Hemoglobin: 10.9 g/dL — ABNORMAL LOW (ref 13.0–17.0)
Hemoglobin: 7.8 g/dL — ABNORMAL LOW (ref 13.0–17.0)
Hemoglobin: 8.8 g/dL — ABNORMAL LOW (ref 13.0–17.0)
Hemoglobin: 8.8 g/dL — ABNORMAL LOW (ref 13.0–17.0)
Hemoglobin: 9.2 g/dL — ABNORMAL LOW (ref 13.0–17.0)
Hemoglobin: 9.5 g/dL — ABNORMAL LOW (ref 13.0–17.0)
Potassium: 3.6 mmol/L (ref 3.5–5.1)
Potassium: 3.7 mmol/L (ref 3.5–5.1)
Potassium: 3.9 mmol/L (ref 3.5–5.1)
Potassium: 4.2 mmol/L (ref 3.5–5.1)
Potassium: 5 mmol/L (ref 3.5–5.1)
Potassium: 5.3 mmol/L — ABNORMAL HIGH (ref 3.5–5.1)
Sodium: 135 mmol/L (ref 135–145)
Sodium: 137 mmol/L (ref 135–145)
Sodium: 138 mmol/L (ref 135–145)
Sodium: 139 mmol/L (ref 135–145)
Sodium: 139 mmol/L (ref 135–145)
Sodium: 139 mmol/L (ref 135–145)
TCO2: 21 mmol/L (ref 0–100)
TCO2: 21 mmol/L (ref 0–100)
TCO2: 23 mmol/L (ref 0–100)
TCO2: 24 mmol/L (ref 0–100)
TCO2: 25 mmol/L (ref 0–100)
TCO2: 26 mmol/L (ref 0–100)

## 2016-06-26 LAB — POCT I-STAT 3, ART BLOOD GAS (G3+)
Acid-base deficit: 1 mmol/L (ref 0.0–2.0)
Acid-base deficit: 2 mmol/L (ref 0.0–2.0)
Acid-base deficit: 5 mmol/L — ABNORMAL HIGH (ref 0.0–2.0)
Acid-base deficit: 6 mmol/L — ABNORMAL HIGH (ref 0.0–2.0)
Acid-base deficit: 7 mmol/L — ABNORMAL HIGH (ref 0.0–2.0)
Bicarbonate: 18.6 mEq/L — ABNORMAL LOW (ref 20.0–24.0)
Bicarbonate: 19 mEq/L — ABNORMAL LOW (ref 20.0–24.0)
Bicarbonate: 20.6 mEq/L (ref 20.0–24.0)
Bicarbonate: 23 mEq/L (ref 20.0–24.0)
Bicarbonate: 23.4 mEq/L (ref 20.0–24.0)
O2 Saturation: 100 %
O2 Saturation: 100 %
O2 Saturation: 100 %
O2 Saturation: 98 %
O2 Saturation: 99 %
Patient temperature: 35.7
Patient temperature: 100
Patient temperature: 37.4
TCO2: 20 mmol/L (ref 0–100)
TCO2: 20 mmol/L (ref 0–100)
TCO2: 22 mmol/L (ref 0–100)
TCO2: 24 mmol/L (ref 0–100)
TCO2: 25 mmol/L (ref 0–100)
pCO2 arterial: 34.7 mmHg — ABNORMAL LOW (ref 35.0–45.0)
pCO2 arterial: 35.9 mmHg (ref 35.0–45.0)
pCO2 arterial: 36 mmHg (ref 35.0–45.0)
pCO2 arterial: 36.6 mmHg (ref 35.0–45.0)
pCO2 arterial: 40.7 mmHg (ref 35.0–45.0)
pH, Arterial: 7.323 — ABNORMAL LOW (ref 7.350–7.450)
pH, Arterial: 7.35 (ref 7.350–7.450)
pH, Arterial: 7.361 (ref 7.350–7.450)
pH, Arterial: 7.369 (ref 7.350–7.450)
pH, Arterial: 7.407 (ref 7.350–7.450)
pO2, Arterial: 120 mmHg — ABNORMAL HIGH (ref 80.0–100.0)
pO2, Arterial: 170 mmHg — ABNORMAL HIGH (ref 80.0–100.0)
pO2, Arterial: 368 mmHg — ABNORMAL HIGH (ref 80.0–100.0)
pO2, Arterial: 387 mmHg — ABNORMAL HIGH (ref 80.0–100.0)
pO2, Arterial: 441 mmHg — ABNORMAL HIGH (ref 80.0–100.0)

## 2016-06-26 LAB — POCT I-STAT 4, (NA,K, GLUC, HGB,HCT)
Glucose, Bld: 103 mg/dL — ABNORMAL HIGH (ref 65–99)
HCT: 27 % — ABNORMAL LOW (ref 39.0–52.0)
Hemoglobin: 9.2 g/dL — ABNORMAL LOW (ref 13.0–17.0)
Potassium: 3.4 mmol/L — ABNORMAL LOW (ref 3.5–5.1)
Sodium: 143 mmol/L (ref 135–145)

## 2016-06-26 LAB — CREATININE, SERUM
Creatinine, Ser: 0.81 mg/dL (ref 0.61–1.24)
GFR calc Af Amer: >60 mL/min (ref >60)
GFR calc non Af Amer: >60 mL/min (ref >60)

## 2016-06-26 LAB — GLUCOSE, CAPILLARY
Glucose-Capillary: 104 mg/dL — ABNORMAL HIGH (ref 65–99)
Glucose-Capillary: 89 mg/dL (ref 65–99)
Glucose-Capillary: 130 mg/dL — ABNORMAL HIGH (ref 65–99)
Glucose-Capillary: 110 mg/dL — ABNORMAL HIGH (ref 65–99)

## 2016-06-26 LAB — CBC
HCT: 29.2 % — ABNORMAL LOW (ref 39.0–52.0)
HCT: 29.4 % — ABNORMAL LOW (ref 39.0–52.0)
Hemoglobin: 9.3 g/dL — ABNORMAL LOW (ref 13.0–17.0)
Hemoglobin: 9.3 g/dL — ABNORMAL LOW (ref 13.0–17.0)
MCH: 25.8 pg — ABNORMAL LOW (ref 26.0–34.0)
MCH: 25.8 pg — ABNORMAL LOW (ref 26.0–34.0)
MCHC: 31.8 g/dL (ref 30.0–36.0)
MCHC: 31.6 g/dL (ref 30.0–36.0)
MCV: 81.1 fL (ref 78.0–100.0)
MCV: 81.7 fL (ref 78.0–100.0)
Platelets: 139 10*3/uL — ABNORMAL LOW (ref 150–400)
Platelets: 142 10*3/uL — ABNORMAL LOW (ref 150–400)
RBC: 3.6 MIL/uL — ABNORMAL LOW (ref 4.22–5.81)
RBC: 3.6 MIL/uL — ABNORMAL LOW (ref 4.22–5.81)
RDW: 14.3 % (ref 11.5–15.5)
RDW: 14.4 % (ref 11.5–15.5)
WBC: 13.9 10*3/uL — ABNORMAL HIGH (ref 4.0–10.5)
WBC: 11.9 10*3/uL — ABNORMAL HIGH (ref 4.0–10.5)

## 2016-06-26 LAB — CK TOTAL AND CKMB (NOT AT ARMC)
CK, MB: 398 ng/mL — ABNORMAL HIGH (ref 0.5–5.0)
Relative Index: 13.3 — ABNORMAL HIGH (ref 0.0–2.5)
Total CK: 2993 U/L — ABNORMAL HIGH (ref 49–397)

## 2016-06-26 LAB — BASIC METABOLIC PANEL
Anion gap: 4 — ABNORMAL LOW (ref 5–15)
BUN: 9 mg/dL (ref 6–20)
CO2: 20 mmol/L — ABNORMAL LOW (ref 22–32)
Calcium: 7.5 mg/dL — ABNORMAL LOW (ref 8.9–10.3)
Chloride: 112 mmol/L — ABNORMAL HIGH (ref 101–111)
Creatinine, Ser: 0.75 mg/dL (ref 0.61–1.24)
GFR calc Af Amer: >60 mL/min (ref >60)
GFR calc non Af Amer: >60 mL/min (ref >60)
Glucose, Bld: 157 mg/dL — ABNORMAL HIGH (ref 65–99)
Potassium: 4.8 mmol/L (ref 3.5–5.1)
Sodium: 136 mmol/L (ref 135–145)

## 2016-06-26 LAB — MAGNESIUM
Magnesium: 2.9 mg/dL — ABNORMAL HIGH (ref 1.7–2.4)
Magnesium: 2.4 mg/dL (ref 1.7–2.4)

## 2016-06-26 LAB — MRSA PCR SCREENING: MRSA by PCR: NEGATIVE

## 2016-06-26 LAB — PROTIME-INR
INR: 1.39
Prothrombin Time: 17.2 seconds — ABNORMAL HIGH (ref 11.4–15.2)

## 2016-06-26 LAB — APTT: aPTT: 33 seconds (ref 24–36)

## 2016-06-26 LAB — HEMOGLOBIN A1C
Hgb A1c MFr Bld: 5.6 % (ref 4.8–5.6)
Mean Plasma Glucose: 114 mg/dL

## 2016-06-26 MED ORDER — ANTISEPTIC ORAL RINSE SOLUTION (CORINZ)
7.0000 mL | OROMUCOSAL | Status: DC
  Administered 2016-06-26 (×3): 7 mL via OROMUCOSAL

## 2016-06-26 MED ORDER — CHLORHEXIDINE GLUCONATE 0.12 % MT SOLN
15.0000 mL | OROMUCOSAL | Status: AC
  Administered 2016-06-26: 15 mL via OROMUCOSAL

## 2016-06-26 MED ORDER — ASPIRIN 81 MG PO CHEW
324.0000 mg | CHEWABLE_TABLET | Freq: Every day | ORAL | Status: DC
  Filled 2016-06-26: qty 4

## 2016-06-26 MED ORDER — SODIUM CHLORIDE 0.45 % IV SOLN
INTRAVENOUS | Status: DC | PRN
Start: 2016-06-26 — End: 2016-06-27

## 2016-06-26 MED ORDER — ENOXAPARIN SODIUM 40 MG/0.4ML ~~LOC~~ SOLN
40.0000 mg | Freq: Every day | SUBCUTANEOUS | Status: DC
  Administered 2016-06-27 – 2016-06-29 (×3): 40 mg via SUBCUTANEOUS
  Filled 2016-06-26 (×3): qty 0.4

## 2016-06-26 MED ORDER — DOPAMINE-DEXTROSE 3.2-5 MG/ML-% IV SOLN: 0.0000 ug/kg/min | INTRAVENOUS | Status: DC

## 2016-06-26 MED ORDER — ASPIRIN EC 325 MG PO TBEC
325.0000 mg | DELAYED_RELEASE_TABLET | Freq: Every day | ORAL | Status: DC
  Administered 2016-06-26 – 2016-06-30 (×5): 325 mg via ORAL
  Filled 2016-06-26 (×5): qty 1

## 2016-06-26 MED ORDER — ATORVASTATIN CALCIUM 80 MG PO TABS
80.0000 mg | ORAL_TABLET | Freq: Every day | ORAL | Status: DC
  Administered 2016-06-26 – 2016-06-29 (×4): 80 mg via ORAL
  Filled 2016-06-26 (×4): qty 1

## 2016-06-26 MED ORDER — LACTATED RINGERS IV SOLN
INTRAVENOUS | Status: DC
  Administered 2016-06-26: 03:00:00 via INTRAVENOUS

## 2016-06-26 MED ORDER — BISACODYL 10 MG RE SUPP: 10.0000 mg | Freq: Every day | RECTAL | Status: DC

## 2016-06-26 MED ORDER — ALBUMIN HUMAN 5 % IV SOLN
250.0000 mL | INTRAVENOUS | Status: AC | PRN
  Administered 2016-06-26 (×2): 250 mL via INTRAVENOUS

## 2016-06-26 MED ORDER — METOPROLOL TARTRATE 12.5 MG HALF TABLET
12.5000 mg | ORAL_TABLET | Freq: Two times a day (BID) | ORAL | Status: DC
Start: 2016-06-26 — End: 2016-06-27
  Administered 2016-06-26 – 2016-06-27 (×2): 12.5 mg via ORAL
  Filled 2016-06-26 (×2): qty 1

## 2016-06-26 MED ORDER — ADULT MULTIVITAMIN W/MINERALS CH
1.0000 | ORAL_TABLET | Freq: Every day | ORAL | Status: DC
  Administered 2016-06-26 – 2016-06-29 (×4): 1 via ORAL
  Filled 2016-06-26 (×4): qty 1

## 2016-06-26 MED ORDER — PROTAMINE SULFATE 10 MG/ML IV SOLN
INTRAVENOUS | Status: DC | PRN
  Administered 2016-06-26: 350 mg via INTRAVENOUS

## 2016-06-26 MED ORDER — MULTIVITAMINS PO CAPS: 1.0000 | ORAL_CAPSULE | Freq: Every day | ORAL | Status: DC

## 2016-06-26 MED ORDER — NITROGLYCERIN IN D5W 200-5 MCG/ML-% IV SOLN
0.0000 ug/min | INTRAVENOUS | Status: DC
  Administered 2016-06-26: 10 ug/min via INTRAVENOUS

## 2016-06-26 MED ORDER — DEXMEDETOMIDINE HCL IN NACL 200 MCG/50ML IV SOLN
0.0000 ug/kg/h | INTRAVENOUS | Status: DC
  Administered 2016-06-26: 0.7 ug/kg/h via INTRAVENOUS
  Filled 2016-06-26 (×2): qty 50

## 2016-06-26 MED ORDER — LACTATED RINGERS IV SOLN: INTRAVENOUS | Status: DC

## 2016-06-26 MED ORDER — SODIUM CHLORIDE 0.9 % WEIGHT BASED INFUSION: 3.0000 mL/kg/h | INTRAVENOUS | Status: AC

## 2016-06-26 MED ORDER — LIDOCAINE BOLUS VIA INFUSION
75.0000 mg | Freq: Once | INTRAVENOUS | Status: AC
  Administered 2016-06-26: 75 mg via INTRAVENOUS
  Filled 2016-06-26: qty 76

## 2016-06-26 MED ORDER — SODIUM CHLORIDE 0.9% FLUSH
3.0000 mL | Freq: Two times a day (BID) | INTRAVENOUS | Status: DC
  Administered 2016-06-26: 3 mL via INTRAVENOUS

## 2016-06-26 MED ORDER — DOCUSATE SODIUM 100 MG PO CAPS
200.0000 mg | ORAL_CAPSULE | Freq: Every day | ORAL | Status: DC
  Administered 2016-06-26 – 2016-06-29 (×4): 200 mg via ORAL
  Filled 2016-06-26 (×5): qty 2

## 2016-06-26 MED ORDER — METOPROLOL TARTRATE 25 MG/10 ML ORAL SUSPENSION: 12.5000 mg | Freq: Two times a day (BID) | ORAL | Status: DC

## 2016-06-26 MED ORDER — TRAMADOL HCL 50 MG PO TABS: 50.0000 mg | ORAL_TABLET | ORAL | Status: DC | PRN

## 2016-06-26 MED ORDER — ACETAMINOPHEN 650 MG RE SUPP
650.0000 mg | Freq: Once | RECTAL | Status: AC
  Administered 2016-06-26: 650 mg via RECTAL

## 2016-06-26 MED ORDER — OXYCODONE HCL 5 MG PO TABS
5.0000 mg | ORAL_TABLET | ORAL | Status: DC | PRN
  Administered 2016-06-26: 10 mg via ORAL
  Administered 2016-06-27 – 2016-06-28 (×3): 5 mg via ORAL
  Administered 2016-06-28: 10 mg via ORAL
  Administered 2016-06-28 (×2): 5 mg via ORAL
  Filled 2016-06-26 (×4): qty 1
  Filled 2016-06-26: qty 2
  Filled 2016-06-26 (×2): qty 1

## 2016-06-26 MED ORDER — LACTATED RINGERS IV SOLN: 500.0000 mL | Freq: Once | INTRAVENOUS | Status: DC | PRN

## 2016-06-26 MED ORDER — DEXTROSE 5 % IV SOLN
1.5000 g | Freq: Two times a day (BID) | INTRAVENOUS | Status: AC
  Administered 2016-06-26 – 2016-06-27 (×4): 1.5 g via INTRAVENOUS
  Filled 2016-06-26 (×4): qty 1.5

## 2016-06-26 MED ORDER — METOPROLOL TARTRATE 5 MG/5ML IV SOLN: 2.5000 mg | INTRAVENOUS | Status: DC | PRN

## 2016-06-26 MED ORDER — INSULIN REGULAR BOLUS VIA INFUSION
0.0000 [IU] | Freq: Three times a day (TID) | INTRAVENOUS | Status: DC
  Filled 2016-06-26: qty 10

## 2016-06-26 MED ORDER — VANCOMYCIN HCL IN DEXTROSE 1-5 GM/200ML-% IV SOLN
1000.0000 mg | Freq: Once | INTRAVENOUS | Status: AC
  Administered 2016-06-26: 1000 mg via INTRAVENOUS
  Filled 2016-06-26: qty 200

## 2016-06-26 MED ORDER — INSULIN ASPART 100 UNIT/ML ~~LOC~~ SOLN
0.0000 [IU] | SUBCUTANEOUS | Status: DC
Start: 2016-06-26 — End: 2016-06-27
  Administered 2016-06-26: 2 [IU] via SUBCUTANEOUS

## 2016-06-26 MED ORDER — ACETAMINOPHEN 500 MG PO TABS
1000.0000 mg | ORAL_TABLET | Freq: Four times a day (QID) | ORAL | Status: DC
  Administered 2016-06-26 – 2016-06-30 (×16): 1000 mg via ORAL
  Filled 2016-06-26 (×16): qty 2

## 2016-06-26 MED ORDER — DEXTROSE 5 % IV SOLN
0.0000 ug/min | INTRAVENOUS | Status: DC
  Administered 2016-06-26: 10 ug/min via INTRAVENOUS
  Filled 2016-06-26: qty 2

## 2016-06-26 MED ORDER — SODIUM CHLORIDE 0.9 % IV SOLN
INTRAVENOUS | Status: DC
  Administered 2016-06-26: 1.3 [IU]/h via INTRAVENOUS
  Filled 2016-06-26: qty 2.5

## 2016-06-26 MED ORDER — ONDANSETRON HCL 4 MG/2ML IJ SOLN
4.0000 mg | Freq: Four times a day (QID) | INTRAMUSCULAR | Status: DC | PRN
  Administered 2016-06-26 – 2016-06-27 (×3): 4 mg via INTRAVENOUS
  Filled 2016-06-26 (×3): qty 2

## 2016-06-26 MED ORDER — CHLORHEXIDINE GLUCONATE 0.12 % MT SOLN
15.0000 mL | Freq: Four times a day (QID) | OROMUCOSAL | Status: DC
  Administered 2016-06-26: 15 mL via OROMUCOSAL

## 2016-06-26 MED ORDER — INSULIN ASPART 100 UNIT/ML ~~LOC~~ SOLN: 0.0000 [IU] | SUBCUTANEOUS | Status: DC

## 2016-06-26 MED ORDER — FAMOTIDINE IN NACL 20-0.9 MG/50ML-% IV SOLN
20.0000 mg | Freq: Two times a day (BID) | INTRAVENOUS | Status: AC
  Administered 2016-06-26 (×2): 20 mg via INTRAVENOUS
  Filled 2016-06-26: qty 50

## 2016-06-26 MED ORDER — POTASSIUM CHLORIDE 10 MEQ/50ML IV SOLN
10.0000 meq | INTRAVENOUS | Status: AC
  Administered 2016-06-26 (×3): 10 meq via INTRAVENOUS

## 2016-06-26 MED ORDER — ACETAMINOPHEN 160 MG/5ML PO SOLN: 650.0000 mg | Freq: Once | ORAL | Status: AC

## 2016-06-26 MED ORDER — SODIUM CHLORIDE 0.9% FLUSH: 3.0000 mL | INTRAVENOUS | Status: DC | PRN

## 2016-06-26 MED ORDER — MORPHINE SULFATE (PF) 2 MG/ML IV SOLN: 2.0000 mg | INTRAVENOUS | Status: DC | PRN

## 2016-06-26 MED ORDER — SODIUM CHLORIDE 0.9% FLUSH
3.0000 mL | Freq: Two times a day (BID) | INTRAVENOUS | Status: DC
  Administered 2016-06-26: 3 mL via INTRAVENOUS
  Administered 2016-06-26: 6 mL via INTRAVENOUS
  Administered 2016-06-27: 3 mL via INTRAVENOUS

## 2016-06-26 MED ORDER — BISACODYL 5 MG PO TBEC
10.0000 mg | DELAYED_RELEASE_TABLET | Freq: Every day | ORAL | Status: DC
  Administered 2016-06-26 – 2016-06-29 (×4): 10 mg via ORAL
  Filled 2016-06-26 (×5): qty 2

## 2016-06-26 MED ORDER — SODIUM CHLORIDE 0.9 % IV SOLN: 250.0000 mL | INTRAVENOUS | Status: DC

## 2016-06-26 MED ORDER — PANTOPRAZOLE SODIUM 40 MG PO TBEC
40.0000 mg | DELAYED_RELEASE_TABLET | Freq: Every day | ORAL | Status: DC
  Administered 2016-06-27 – 2016-06-30 (×4): 40 mg via ORAL
  Filled 2016-06-26 (×4): qty 1

## 2016-06-26 MED ORDER — MAGNESIUM SULFATE 4 GM/100ML IV SOLN
4.0000 g | Freq: Once | INTRAVENOUS | Status: AC
  Administered 2016-06-26: 4 g via INTRAVENOUS
  Filled 2016-06-26: qty 100

## 2016-06-26 MED ORDER — MIDAZOLAM HCL 2 MG/2ML IJ SOLN: 2.0000 mg | INTRAMUSCULAR | Status: DC | PRN

## 2016-06-26 MED ORDER — TOPIRAMATE 25 MG PO TABS
100.0000 mg | ORAL_TABLET | Freq: Every day | ORAL | Status: DC
  Administered 2016-06-26 – 2016-06-29 (×4): 100 mg via ORAL
  Filled 2016-06-26 (×3): qty 4
  Filled 2016-06-26: qty 1

## 2016-06-26 MED ORDER — LIDOCAINE IN D5W 4-5 MG/ML-% IV SOLN
2.0000 mg/min | INTRAVENOUS | Status: DC
  Administered 2016-06-26: 2 mg/min via INTRAVENOUS
  Filled 2016-06-26: qty 500

## 2016-06-26 MED ORDER — MORPHINE SULFATE (PF) 2 MG/ML IV SOLN: 1.0000 mg | INTRAVENOUS | Status: AC | PRN

## 2016-06-26 MED ORDER — SODIUM CHLORIDE 0.9 % IV SOLN
INTRAVENOUS | Status: DC
  Administered 2016-06-26: 03:00:00 via INTRAVENOUS

## 2016-06-26 MED ORDER — MIDAZOLAM HCL 2 MG/2ML IJ SOLN
INTRAMUSCULAR | Status: AC
  Filled 2016-06-26: qty 4

## 2016-06-26 MED ORDER — SODIUM CHLORIDE 0.9 % IV SOLN
250.0000 mL | INTRAVENOUS | Status: DC | PRN
Start: 2016-06-26 — End: 2016-06-27

## 2016-06-26 MED ORDER — ACETAMINOPHEN 160 MG/5ML PO SOLN: 1000.0000 mg | Freq: Four times a day (QID) | ORAL | Status: DC

## 2016-06-26 MED FILL — Heparin Sodium (Porcine) Inj 1000 Unit/ML: INTRAMUSCULAR | Qty: 30 | Status: AC

## 2016-06-26 MED FILL — Potassium Chloride Inj 2 mEq/ML: INTRAVENOUS | Qty: 40 | Status: AC

## 2016-06-26 MED FILL — Magnesium Sulfate Inj 50%: INTRAMUSCULAR | Qty: 10 | Status: AC

## 2016-06-26 NOTE — OR Nursing (Signed)
1st call made to 2S @1222 , providing a patient update (plans for transfer to Room 8 postoperatively per Randall Hiss). 2nd call made to 2S @1234 , providing a patient update.   3rd call made to 2S @1249 , providing a patient update. 4th (final) call made to 2S, confirming transfer to Room 8.

## 2016-06-26 NOTE — Progress Notes (Signed)
  Echocardiogram 2D Echocardiogram has been performed.  Benjamin Robbins 06/26/2016, 5:43 PM

## 2016-06-26 NOTE — Procedures (Signed)
Extubation Procedure Note  Patient Details:   Name: Benjamin Robbins DOB: Oct 30, 1956 MRN: [id number]   Airway Documentation:     Evaluation  O2 sats: stable throughout Complications: No apparent complications Patient did tolerate procedure well. Bilateral Breath Sounds: Clear, Diminished   Yes   Pt extubated to 2L N/C, no stridor noted.  RN at bedside. Donnetta Hail 06/26/2016, 10:10 AM

## 2016-06-26 NOTE — Transfer of Care (Signed)
Immediate Anesthesia Transfer of Care Note  Patient: Benjamin Robbins  Procedure(s) Performed: Procedure(s): CORONARY ARTERY BYPASS GRAFTING (CABG) x (LIMA to LAD, SVG to OM, SVG SEQUENTIALLY to PDA and PLB) with EVH from RIGHT THIGH and LOWER LEG ([name])  Patient Location: SICU  Anesthesia Type:General  Level of Consciousness: Patient remains intubated per anesthesia plan  Airway & Oxygen Therapy: Patient placed on Ventilator (see vital sign flow sheet for setting)  Post-op Assessment: Report given to RN and Post -op Vital signs reviewed and stable  Post vital signs: Reviewed and stable  Last Vitals:  Vitals:   06/25/16 1906 06/25/16 1911  BP: 124/81 125/82  Pulse: 72 71  Resp: 18 11    Last Pain:  Vitals:   06/25/16 1930  PainSc: 3          Complications: No apparent anesthesia complications

## 2016-06-26 NOTE — Progress Notes (Signed)
Patient examined and record reviewed.Hemodynamics stable,labs satisfactory.Patient had stable day.Continue current care. Tharon Aquas Trigt III 06/26/2016

## 2016-06-26 NOTE — Progress Notes (Signed)
Pt having runs of VT (10-20 beats) and frequent PVCs. Dr. Cyndia Bent paged and made aware. Orders received to give Lidocaine bolus 75 mg and infusion @ 2mg , and three additional runs of K+. Will implement and continue to monitor closely.

## 2016-06-26 NOTE — Progress Notes (Signed)
1 Day Post-Op Procedure(s) (LRB): CORONARY ARTERY BYPASS GRAFTING (CABG) x (LIMA to LAD, SVG to OM, SVG SEQUENTIALLY to PDA and PLB) with EVH from RIGHT THIGH and LOWER LEG ([name]) Subjective: Intubated and sedated on Precedex  Objective: Vital signs in last 24 hours: Temp:  [95.5 F (35.3 C)-97.3 F (36.3 C)] 97.3 F (36.3 C) (07/28 0700) Pulse Rate:  [0-95] 91 (07/28 0700) Cardiac Rhythm: Normal sinus rhythm (07/28 0300) Resp:  [0-21] 13 (07/28 0700) BP: (83-149)/(62-110) 111/78 (07/28 0700) SpO2:  [0 %-100 %] 100 % (07/28 0700) FiO2 (%):  [40 %-100 %] 40 % (07/28 0326) Weight:  [73.5 kg (162 lb)-83.6 kg (184 lb 4.9 oz)] 83.6 kg (184 lb 4.9 oz) (07/28 0500)  Hemodynamic parameters for last 24 hours: PAP: (9-27)/(2-16) 27/16 CO:  [2.9 L/min-3.9 L/min] 3 L/min CI:  [1.5 L/min/m2-2 L/min/m2] 1.5 L/min/m2  Intake/Output from previous day: 07/27 0701 - 07/28 0700 In: 3434.5 [I.V.:2554.5; Blood:230; IV [id number] Out: [id number]; Blood:650; Chest Tube:130] Intake/Output this shift: No intake/output data recorded.  Heart: regular rate and rhythm, S1, S2 normal, no murmur, click, rub or gallop Lungs: clear to auscultation bilaterally Wound: dressings dry  Lab Results:  Recent Labs  06/25/16 1908  06/25/16 2335 06/26/16 0030 06/26/16 0147  WBC 12.2*  --   --   --   --   HGB 10.7*  < > 8.4* 8.8* 9.2*  HCT 33.1*  < > 26.1* 26.0* 27.0*  PLT 216  --  171  --   --   < > = values in this interval not displayed. BMET:  Recent Labs  06/25/16 1908  06/25/16 2252 06/26/16 0030 06/26/16 0147  NA 135  < > 138 139 143  K 3.2*  < > 5.3* 3.9 3.4*  CL 110  < > 107 105  --   CO2 20*  --   --   --   --   GLUCOSE 117*  < > 113* 148* 103*  BUN 12  < > 9 9  --   CREATININE 0.74  < > 0.50* 0.50*  --   CALCIUM 8.0*  --   --   --   --   < > = values in this interval not displayed.  PT/INR: No results for input(s): LABPROT, INR in the last 72 hours. ABG    Component Value  Date/Time   PHART 7.361 06/26/2016 0147   HCO3 20.6 06/26/2016 0147   TCO2 22 06/26/2016 0147   ACIDBASEDEF 5.0 (H) 06/26/2016 0147   O2SAT 100.0 06/26/2016 0147   CBG (last 3)   Recent Labs  06/26/16 0252 06/26/16 0354  GLUCAP 104* 89   CXR: ok  ECG: Sinus rhythm, anteroseptal MI  Assessment/Plan: S/P Procedure(s) (LRB): CORONARY ARTERY BYPASS GRAFTING (CABG) x (LIMA to LAD, SVG to OM, SVG SEQUENTIALLY to PDA and PLB) with EVH from RIGHT THIGH and LOWER LEG ([name])  He is hemodynamically stable after emergent CABG in setting of acute anterior MI. He has had some NSVT postop and was started on lidocaine. Has had no further VT since. Will get off lidocaine after extubation.  Plan to wean vent to extubate this am.   LOS: 0 days    Benjamin Robbins 06/26/2016

## 2016-06-26 NOTE — Progress Notes (Signed)
6 french sheath removed from right radial artery and TR band placed with 14 cc of air.  Site look good with no hematoma.  Distal pulses good with good pulse ox waveform on that hand.

## 2016-06-26 NOTE — Op Note (Signed)
CARDIOVASCULAR SURGERY OPERATIVE NOTE  06/26/2016  Surgeon:  Gaye Pollack, MD  First Assistant: Lars Pinks, PA-C   Preoperative Diagnosis:  Severe left main and multi-vessel coronary artery disease s/p acute anterior STEMI   Postoperative Diagnosis:  Same   Procedure:  1. Emergency Median Sternotomy 2. Extracorporeal circulation 3.   Coronary artery bypass grafting x 4   Left internal mammary graft to the LAD  SVG to OM  Sequential SVG to PDA and PL  4.   Endoscopic vein harvest from the right leg   Anesthesia:  General Endotracheal   Clinical History/Surgical Indication:  The patient is a 60 year old gentleman whose only significant medical history is of prostate cancer who developed acute chest pain today at work in [employer] here. It was 5/10 and he was pale and diaphoretic and taken to the ER where ECG showed an anterior STEMI. He became nauseated and mildly short of breath. He was given ASA, SL NTG x 2 and IV heparin with improvement in pain to 3/10. ECG improved but still abnormal and taken to cath lab. He has 80% ostial LM with dampening with 28F catheter as well as severe 3-vessel disease with subtotal LAD and distal RCA. LVEF is decreased with anteroapical severe hypokinesis. He is still having mild chest pain in the cath lab but has been hemodynamically stable. He has high grade left main and severe 3-vessel coronary artery disease presenting with acute anterior STEMI. He needs emergent CABG. I discussed the operative procedure with the patient and his wife including alternatives, benefits and risks; including but not limited to bleeding, blood transfusion, infection, stroke, myocardial infarction, graft failure, heart block requiring a permanent pacemaker, organ dysfunction, and death.  [name] understands and agrees to proceed.    Preparation:  The  patient was seen in the preoperative holding area and the correct patient, correct operation were confirmed with the patient after reviewing the medical record and catheterization. The consent was signed by me. Preoperative antibiotics were given. A pulmonary arterial line and radial arterial line were placed by the anesthesia team. The patient was taken back to the operating room and positioned supine on the operating room table. After being placed under general endotracheal anesthesia by the anesthesia team a foley catheter was placed. The neck, chest, abdomen, and both legs were prepped with betadine soap and solution and draped in the usual sterile manner. A surgical time-out was taken and the correct patient and operative procedure were confirmed with the nursing and anesthesia staff.   Cardiopulmonary Bypass:  A median sternotomy was performed. The pericardium was opened in the midline. Right ventricular function appeared normal. The ascending aorta was of normal size and had no palpable plaque. There were no contraindications to aortic cannulation or cross-clamping. The patient was fully systemically heparinized and the ACT was maintained > 400 sec. The proximal aortic arch was cannulated with a 20 F aortic cannula for arterial inflow. Venous cannulation was performed via the right atrial appendage using a two-staged venous cannula. An antegrade cardioplegia/vent cannula was inserted into the mid-ascending aorta. Aortic occlusion was performed with a single cross-clamp. Systemic cooling to 32 degrees Centigrade and topical cooling of the heart with iced saline were used. Hyperkalemic antegrade cold blood cardioplegia was used to induce diastolic arrest and was then given at about 20 minute intervals throughout the period of arrest to maintain myocardial temperature at or below 10 degrees centigrade. A temperature probe was inserted into the interventricular septum  and an insulating pad was placed in the  pericardium.   Left internal mammary harvest:  The left side of the sternum was retracted using the Rultract retractor. The left internal mammary artery was harvested as a pedicle graft. All side branches were clipped. It was a medium-sized vessel of good quality with excellent blood flow. It was ligated distally and divided. It was sprayed with topical papaverine solution to prevent vasospasm.   Endoscopic vein harvest:  The right greater saphenous vein was harvested endoscopically through a 2 cm incision medial to the right knee. It was harvested from the upper thigh to below the knee. It was a medium-sized vein of good quality. The side branches were all ligated with 4-0 silk ties.    Coronary arteries:  The coronary arteries were examined.   LAD:  Diffusely diseased but graftable distally  LCX:  Large OM that has diffuse segmental disease extending out into the sub-branches. The OM2 was small, diffusely diseased and lying high on the lateral wall and not graftable  RCA:  The PDA had patchy segmental disease and the PL had no significant disease but was small caliber.   Grafts:  1. LIMA to the LAD: 2.5 mm. It was sewn end to side using 8-0 prolene continuous suture. 2. SVG to OM:  2.0 mm. It was sewn end to side using 7-0 prolene continuous suture. 3. Sequential SVG to PDA:  1.75 mm. It was sewn sequential side to side using 7-0 prolene continuous suture. 4. Sequential SVG to PL:  1.6 mm. It was sewn sequential end to side using 7-0 prolene continuous suture.  The proximal vein graft anastomoses were performed to the mid-ascending aorta using continuous 6-0 prolene suture. Graft markers were placed around the proximal anastomoses.   Completion:  The patient was rewarmed to 37 degrees Centigrade. The clamp was removed from the LIMA pedicle and there was rapid warming of the septum and return of ventricular fibrillation. The crossclamp was removed with a time of 74 minutes.  There was spontaneous return of sinus rhythm. The distal and proximal anastomoses were checked for hemostasis. The position of the grafts was satisfactory. Two temporary epicardial pacing wires were placed on the right atrium and two on the right ventricle. The patient was weaned from CPB without difficulty on no inotropes. CPB time was 92 minutes. Cardiac output was 5 LPM. TEE showed improved contractility of the anterior wall and septum. Heparin was fully reversed with protamine and the aortic and venous cannulas removed. Hemostasis was achieved. Mediastinal and left pleural drainage tubes were placed. The sternum was closed with #6 stainless steel wires. The fascia was closed with continuous # 1 vicryl suture. The subcutaneous tissue was closed with 2-0 vicryl continuous suture. The skin was closed with 3-0 vicryl subcuticular suture. All sponge, needle, and instrument counts were reported correct at the end of the case. Dry sterile dressings were placed over the incisions and around the chest tubes which were connected to pleurevac suction. The patient was then transported to the surgical intensive care unit in critical but stable condition.

## 2016-06-27 ENCOUNTER — Inpatient Hospital Stay (HOSPITAL_COMMUNITY): Payer: 59

## 2016-06-27 DIAGNOSIS — I5021 Acute systolic (congestive) heart failure: Secondary | ICD-10-CM

## 2016-06-27 DIAGNOSIS — E785 Hyperlipidemia, unspecified: Secondary | ICD-10-CM | POA: Diagnosis not present

## 2016-06-27 DIAGNOSIS — Z800 Family history of malignant neoplasm of digestive organs: Secondary | ICD-10-CM | POA: Diagnosis not present

## 2016-06-27 DIAGNOSIS — Z8546 Personal history of malignant neoplasm of prostate: Secondary | ICD-10-CM | POA: Diagnosis not present

## 2016-06-27 DIAGNOSIS — I25110 Atherosclerotic heart disease of native coronary artery with unstable angina pectoris: Secondary | ICD-10-CM

## 2016-06-27 DIAGNOSIS — K219 Gastro-esophageal reflux disease without esophagitis: Secondary | ICD-10-CM | POA: Diagnosis not present

## 2016-06-27 DIAGNOSIS — I2109 ST elevation (STEMI) myocardial infarction involving other coronary artery of anterior wall: Secondary | ICD-10-CM | POA: Diagnosis not present

## 2016-06-27 DIAGNOSIS — Z8249 Family history of ischemic heart disease and other diseases of the circulatory system: Secondary | ICD-10-CM | POA: Diagnosis not present

## 2016-06-27 DIAGNOSIS — I471 Supraventricular tachycardia: Secondary | ICD-10-CM | POA: Diagnosis not present

## 2016-06-27 DIAGNOSIS — I5020 Unspecified systolic (congestive) heart failure: Secondary | ICD-10-CM | POA: Diagnosis not present

## 2016-06-27 DIAGNOSIS — I472 Ventricular tachycardia: Secondary | ICD-10-CM | POA: Diagnosis not present

## 2016-06-27 LAB — GLUCOSE, CAPILLARY
Glucose-Capillary: 100 mg/dL — ABNORMAL HIGH (ref 65–99)
Glucose-Capillary: 114 mg/dL — ABNORMAL HIGH (ref 65–99)
Glucose-Capillary: 123 mg/dL — ABNORMAL HIGH (ref 65–99)
Glucose-Capillary: 144 mg/dL — ABNORMAL HIGH (ref 65–99)
Glucose-Capillary: 98 mg/dL (ref 65–99)

## 2016-06-27 LAB — CBC
HCT: 27.8 % — ABNORMAL LOW (ref 39.0–52.0)
Hemoglobin: 8.7 g/dL — ABNORMAL LOW (ref 13.0–17.0)
MCH: 25.6 pg — ABNORMAL LOW (ref 26.0–34.0)
MCHC: 31.3 g/dL (ref 30.0–36.0)
MCV: 81.8 fL (ref 78.0–100.0)
Platelets: 146 10*3/uL — ABNORMAL LOW (ref 150–400)
RBC: 3.4 MIL/uL — ABNORMAL LOW (ref 4.22–5.81)
RDW: 14.7 % (ref 11.5–15.5)
WBC: 11.4 10*3/uL — ABNORMAL HIGH (ref 4.0–10.5)

## 2016-06-27 LAB — ECHOCARDIOGRAM COMPLETE
FS: 28 % (ref 28–44)
Height: 72 in
IVS/LV PW RATIO, ED: 0.85
LA ID, A-P, ES: 32 mm
LA diam end sys: 32 mm
LA diam index: 1.55 cm/m2
LA vol A4C: 55.8 ml
LA vol index: 31.7 mL/m2
LA vol: 65.4 mL
LV PW d: 10.2 mm — AB (ref 0.6–1.1)
LVOT area: 4.15 cm2
LVOT diameter: 23 mm
Lateral S' vel: 8.1 cm/s
RV sys press: 27 mmHg
Reg peak vel: 246 cm/s
TAPSE: 10.2 mm
TR max vel: 246 cm/s
Weight: 2948.87 oz

## 2016-06-27 LAB — BASIC METABOLIC PANEL
Anion gap: 4 — ABNORMAL LOW (ref 5–15)
BUN: 7 mg/dL (ref 6–20)
CO2: 23 mmol/L (ref 22–32)
Calcium: 8 mg/dL — ABNORMAL LOW (ref 8.9–10.3)
Chloride: 107 mmol/L (ref 101–111)
Creatinine, Ser: 0.79 mg/dL (ref 0.61–1.24)
GFR calc Af Amer: >60 mL/min (ref >60)
GFR calc non Af Amer: >60 mL/min (ref >60)
Glucose, Bld: 106 mg/dL — ABNORMAL HIGH (ref 65–99)
Potassium: 3.6 mmol/L (ref 3.5–5.1)
Sodium: 134 mmol/L — ABNORMAL LOW (ref 135–145)

## 2016-06-27 LAB — PROTIME-INR
INR: 1.38
Prothrombin Time: 17.1 seconds — ABNORMAL HIGH (ref 11.4–15.2)

## 2016-06-27 LAB — HEMOGLOBIN A1C
Hgb A1c MFr Bld: 5.7 % — ABNORMAL HIGH (ref 4.8–5.6)
Mean Plasma Glucose: 117 mg/dL

## 2016-06-27 MED ORDER — METOPROLOL TARTRATE 25 MG PO TABS
25.0000 mg | ORAL_TABLET | Freq: Two times a day (BID) | ORAL | Status: DC
Start: 2016-06-27 — End: 2016-06-28
  Administered 2016-06-27: 25 mg via ORAL
  Filled 2016-06-27: qty 1

## 2016-06-27 MED ORDER — METOPROLOL TARTRATE 25 MG/10 ML ORAL SUSPENSION
12.5000 mg | Freq: Two times a day (BID) | ORAL | Status: DC
  Filled 2016-06-27: qty 10

## 2016-06-27 MED ORDER — INSULIN ASPART 100 UNIT/ML ~~LOC~~ SOLN
0.0000 [IU] | Freq: Three times a day (TID) | SUBCUTANEOUS | Status: DC
  Administered 2016-06-27 (×2): 2 [IU] via SUBCUTANEOUS

## 2016-06-27 MED ORDER — SODIUM CHLORIDE 0.9% FLUSH
3.0000 mL | Freq: Two times a day (BID) | INTRAVENOUS | Status: DC
  Administered 2016-06-27 – 2016-06-28 (×3): 3 mL via INTRAVENOUS

## 2016-06-27 MED ORDER — MAGNESIUM HYDROXIDE 400 MG/5ML PO SUSP: 30.0000 mL | Freq: Every day | ORAL | Status: DC | PRN

## 2016-06-27 MED ORDER — POTASSIUM CHLORIDE 10 MEQ/50ML IV SOLN
10.0000 meq | INTRAVENOUS | Status: AC
  Administered 2016-06-27 (×3): 10 meq via INTRAVENOUS
  Filled 2016-06-27 (×3): qty 50

## 2016-06-27 MED ORDER — ALUM & MAG HYDROXIDE-SIMETH 200-200-20 MG/5ML PO SUSP
15.0000 mL | ORAL | Status: DC | PRN
Start: 2016-06-27 — End: 2016-06-30

## 2016-06-27 MED ORDER — LISINOPRIL 2.5 MG PO TABS
2.5000 mg | ORAL_TABLET | Freq: Every day | ORAL | Status: DC
Start: 2016-06-27 — End: 2016-06-30
  Administered 2016-06-27 – 2016-06-30 (×4): 2.5 mg via ORAL
  Filled 2016-06-27 (×4): qty 1

## 2016-06-27 MED ORDER — SODIUM CHLORIDE 0.9% FLUSH: 3.0000 mL | INTRAVENOUS | Status: DC | PRN

## 2016-06-27 MED ORDER — SODIUM CHLORIDE 0.9 % IV SOLN: 250.0000 mL | INTRAVENOUS | Status: DC | PRN

## 2016-06-27 MED ORDER — POTASSIUM CHLORIDE CRYS ER 20 MEQ PO TBCR
20.0000 meq | EXTENDED_RELEASE_TABLET | Freq: Every day | ORAL | Status: DC
  Administered 2016-06-28 – 2016-06-30 (×3): 20 meq via ORAL
  Filled 2016-06-27 (×3): qty 1

## 2016-06-27 MED ORDER — MOVING RIGHT ALONG BOOK
Freq: Once | Status: AC
  Administered 2016-06-27: 10:00:00
  Filled 2016-06-27: qty 1

## 2016-06-27 MED ORDER — FUROSEMIDE 40 MG PO TABS
40.0000 mg | ORAL_TABLET | Freq: Every day | ORAL | Status: DC
  Administered 2016-06-27 – 2016-06-30 (×4): 40 mg via ORAL
  Filled 2016-06-27 (×4): qty 1

## 2016-06-27 MED ORDER — DIPHENHYDRAMINE HCL 25 MG PO CAPS
25.0000 mg | ORAL_CAPSULE | Freq: Every evening | ORAL | Status: DC | PRN
Start: 2016-06-27 — End: 2016-06-30

## 2016-06-27 NOTE — Progress Notes (Signed)
2 Days Post-Op Procedure(s) (LRB): CORONARY ARTERY BYPASS GRAFTING (CABG) x (LIMA to LAD, SVG to OM, SVG SEQUENTIALLY to PDA and PLB) with EVH from RIGHT THIGH and LOWER LEG ([name]) Subjective: Progressing after emerg CABG for anterior MI Transfer to stepdown today Objective: Vital signs in last 24 hours: Temp:  [98 F (36.7 C)-100.2 F (37.9 C)] 98 F (36.7 C) (07/29 0700) Pulse Rate:  [96-110] 103 (07/29 0700) Cardiac Rhythm: Normal sinus rhythm (07/28 2000) Resp:  [8-29] 26 (07/29 0700) BP: (88-115)/(62-84) 109/72 (07/29 0700) SpO2:  [96 %-100 %] 98 % (07/29 0700) Weight:  [173 lb 4.5 oz (78.6 kg)] 173 lb 4.5 oz (78.6 kg) (07/29 0600)  Hemodynamic parameters for last 24 hours: PAP: (18-23)/(12-18) 20/13  Intake/Output from previous day: 07/28 0701 - 07/29 0700 In: 1732 [P.O.:840; I.V.:792; IV Piggyback:100] Out: 2350 [Urine:2200; Chest Tube:150] Intake/Output this shift: No intake/output data recorded.       Exam    General- alert and comfortable   Lungs- clear without rales, wheezes   Cor- regular rate and rhythm, no murmur , gallop   Abdomen- soft, non-tender   Extremities - warm, non-tender, minimal edema   Neuro- oriented, appropriate, no focal weakness   Lab Results:  Recent Labs  06/26/16 1656 06/27/16 0448  WBC 11.9* 11.4*  HGB 9.3* 8.7*  HCT 29.4* 27.8*  PLT 142* 146*   BMET:  Recent Labs  06/26/16 0736 06/26/16 1652 06/26/16 1656 06/27/16 0448  NA 136 135  --  134*  K 4.8 4.2  --  3.6  CL 112* 105  --  107  CO2 20*  --   --  23  GLUCOSE 157* 133*  --  106*  BUN 9 8  --  7  CREATININE 0.75 0.70 0.81 0.79  CALCIUM 7.5*  --   --  8.0*    PT/INR:  Recent Labs  06/27/16 0448  LABPROT 17.1*  INR 1.38   ABG    Component Value Date/Time   PHART 7.323 (L) 06/26/2016 1112   HCO3 18.6 (L) 06/26/2016 1112   TCO2 21 06/26/2016 1652   ACIDBASEDEF 7.0 (H) 06/26/2016 1112   O2SAT 98.0 06/26/2016 1112   CBG (last 3)   Recent Labs   06/26/16 1944 06/26/16 2342 06/27/16 0837  GLUCAP 110* 98 114*    Assessment/Plan: S/P Procedure(s) (LRB): CORONARY ARTERY BYPASS GRAFTING (CABG) x (LIMA to LAD, SVG to OM, SVG SEQUENTIALLY to PDA and PLB) with EVH from RIGHT THIGH and LOWER LEG ([name]) Mobilize Diuresis Plan for transfer to step-down: see transfer orders start low dose lisinopril prior to discharge   LOS: 1 day    Benjamin Robbins 06/27/2016

## 2016-06-27 NOTE — Progress Notes (Signed)
K+= 3.6 and creat= 0.78 w/ urine o/p > 30cc/hr; TCTS KCL protocol initiated with 10 mEq KCL in 50cc IV x 3, each over one hour.

## 2016-06-27 NOTE — Progress Notes (Signed)
Transferred pt to 2w25 at this time.  Pt has no c/o pain or s/s of any acute distress.

## 2016-06-27 NOTE — Progress Notes (Signed)
Patient Name: Benjamin Robbins Date of Encounter: 06/27/2016  Principal Problem:   ST elevation myocardial infarction (STEMI) of anterior wall, initial episode of care [facility]) Active Problems:   GERD (gastroesophageal reflux disease)   CAD (coronary artery disease)   Length of Stay: 1  SUBJECTIVE  Generally feels well. Lying fully supine without dyspnea, but is tachycardic.  He had pre-CABG angina while mowing the lawn, but did not recognize it as such ("heartburn"). No arrhythmia. Roughly 11 lb over admission weight.  CURRENT MEDS . acetaminophen  1,000 mg Oral Q6H   Or  . acetaminophen (TYLENOL) oral liquid 160 mg/5 mL  1,000 mg Per Tube Q6H  . aspirin EC  325 mg Oral Daily   Or  . aspirin  324 mg Per Tube Daily  . atorvastatin  80 mg Oral q1800  . bisacodyl  10 mg Oral Daily   Or  . bisacodyl  10 mg Rectal Daily  . cefUROXime (ZINACEF)  IV  1.5 g Intravenous Q12H  . docusate sodium  200 mg Oral Daily  . enoxaparin (LOVENOX) injection  40 mg Subcutaneous QHS  . furosemide  40 mg Oral Daily  . insulin aspart  0-24 Units Subcutaneous TID AC & HS  . metoprolol tartrate  25 mg Oral BID   Or  . metoprolol tartrate  12.5 mg Per Tube BID  . multivitamin with minerals  1 tablet Oral QAC supper  . pantoprazole  40 mg Oral Daily  . potassium chloride  20 mEq Oral Daily  . sodium chloride flush  3 mL Intravenous Q12H  . topiramate  100 mg Oral QHS    OBJECTIVE   Intake/Output Summary (Last 24 hours) at 06/27/16 1242 Last data filed at 06/27/16 0700  Gross per 24 hour  Intake           1381.5 ml  Output             1900 ml  Net           -518.5 ml   Filed Weights   06/25/16 1803 06/26/16 0500 06/27/16 0600  Weight: 73.5 kg (162 lb) 83.6 kg (184 lb 4.9 oz) 78.6 kg (173 lb 4.5 oz)    PHYSICAL EXAM Vitals:   06/27/16 0500 06/27/16 0600 06/27/16 0700 06/27/16 1100  BP: 106/72 111/72 109/72   Pulse: (!) 108 (!) 110 (!) 103   Resp: 20 (!) 25 (!) 26   Temp:   98 F  (36.7 C) 98.5 F (36.9 C)  TempSrc:   Oral Oral  SpO2: 97% 96% 98%   Weight:  78.6 kg (173 lb 4.5 oz)    Height:       General: Alert, oriented x3, no distress Head: no evidence of trauma, PERRL, EOMI, no exophtalmos or lid lag, no myxedema, no xanthelasma; normal ears, nose and oropharynx Neck: normal jugular venous pulsations and no hepatojugular reflux; brisk carotid pulses without delay and no carotid bruits Chest: clear to auscultation, no signs of consolidation by percussion or palpation, normal fremitus, symmetrical and full respiratory excursions Cardiovascular: normal position and quality of the apical impulse, regular rhythm, normal first and second heart sounds, no rubs or gallops, no murmur Abdomen: no tenderness or distention, no masses by palpation, no abnormal pulsatility or arterial bruits, normal bowel sounds, no hepatosplenomegaly Extremities: no clubbing, cyanosis or edema; 2+ radial, ulnar and brachial pulses bilaterally; 2+ right femoral, posterior tibial and dorsalis pedis pulses; 2+ left femoral, posterior tibial and dorsalis pedis pulses;  no subclavian or femoral bruits Neurological: grossly nonfocal  LABS  CBC  Recent Labs  06/26/16 1656 06/27/16 0448  WBC 11.9* 11.4*  HGB 9.3* 8.7*  HCT 29.4* 27.8*  MCV 81.7 81.8  PLT 142* 123456*   Basic Metabolic Panel  Recent Labs  06/26/16 0730  06/26/16 0736 06/26/16 1652 06/26/16 1656 06/27/16 0448  NA  --   < > 136 135  --  134*  K  --   < > 4.8 4.2  --  3.6  CL  --   < > 112* 105  --  107  CO2  --   --  20*  --   --  23  GLUCOSE  --   < > 157* 133*  --  106*  BUN  --   < > 9 8  --  7  CREATININE  --   < > 0.75 0.70 0.81 0.79  CALCIUM  --   --  7.5*  --   --  8.0*  MG 2.9*  --   --   --  2.4  --   < > = values in this interval not displayed. Liver Function Tests  Recent Labs  06/25/16 1908  AST 24  ALT 21  ALKPHOS 62  BILITOT 0.3  PROT 5.5*  ALBUMIN 3.5   No results for input(s): LIPASE,  AMYLASE in the last 72 hours. Cardiac Enzymes  Recent Labs  06/25/16 1908 06/26/16 0736  CKTOTAL  --  2,993*  CKMB  --  398.0*  TROPONINI 0.14*  --    BNP Invalid input(s): POCBNP D-Dimer No results for input(s): DDIMER in the last 72 hours. Hemoglobin A1C  Recent Labs  06/26/16 0153  HGBA1C 5.7*   Fasting Lipid Panel  Recent Labs  06/25/16 1910  CHOL 154  HDL 35*  LDLCALC 111*  TRIG 40  CHOLHDL 4.4   Thyroid Function Tests  Recent Labs  06/25/16 1909  TSH 4.531*    Radiology Studies Imaging results have been reviewed and Dg Chest Port 1 View  Result Date: 06/27/2016 CLINICAL DATA:  Status post CABG. EXAM: PORTABLE CHEST 1 VIEW COMPARISON:  June 26, 2016 FINDINGS: The ET and NG tubes have been removed. A sheath remains in the right internal jugular vein but the PA catheter has been removed. No pneumothorax. No pulmonary nodules, masses, or focal infiltrates. Mild atelectasis in the left retrocardiac region. No overt edema. IMPRESSION: Most of the support apparatus is been removed. No acute abnormalities. Electronically Signed   By: Dorise Bullion III M.D   On: 06/27/2016 07:57  Dg Chest Port 1 View  Result Date: 06/26/2016 CLINICAL DATA:  Status post CABG. EXAM: PORTABLE CHEST 1 VIEW COMPARISON:  06/26/2016 FINDINGS: Swan-Ganz catheter has been repositioned with tip now overlying the expected location of right main pulmonary artery. Endotracheal tube terminates 4.4 cm superior to the carina. Enteric catheter is seen with side hole overlying the expected location of the distal esophagus. Mediastinal and chest drains are unchanged. Postsurgical changes from CABG are noted. Cardiomediastinal silhouette is normal. Mediastinal contours appear intact. There is no evidence of focal airspace consolidation, pleural effusion or pneumothorax. Mild left lower lobe atelectasis. Osseous structures are without acute abnormality. Soft tissues are grossly normal. IMPRESSION: Enteric  catheter with side hole overlying the expected location of distal esophagus. The remainder of the support apparatus as described. Mild left lower lobe atelectasis. Electronically Signed   By: Fidela Salisbury M.D.   On: 06/26/2016 07:33  Dg Chest Port 1 View  Result Date: 06/26/2016 CLINICAL DATA:  Post open heart surgery. EXAM: PORTABLE CHEST 1 VIEW COMPARISON:  05/23/2014; 04/10/2014 FINDINGS: Mildly enlarged cardiac silhouette and mediastinal contours, likely the sequela of supine patient positioning an interval median sternotomy. Endotracheal tube overlies the tracheal air column with tip approximately 5.8 cm above the carina. Enteric tube side port projects over the distal esophagus. Right jugular approach PA catheter tip overlies the right lower lobe pulmonary artery. Bilateral chest tubes. No supine evidence of pneumothorax or pleural effusion. Minimal perihilar opacities, left greater than right, favored to represent atelectasis. No definite evidence of edema. No acute osseus abnormalities. IMPRESSION: 1. Enteric tube side port projects over the distal esophagus. Advancement approximately 10 cm is recommended. 2. Otherwise, appropriately positioned support apparatus as above. No supine evidence of pneumothorax. 3. Minimal perihilar opacities favored to represent atelectasis. No definite evidence of edema Electronically Signed   By: Sandi Mariscal M.D.   On: 06/26/2016 02:01   TELE Sinus tachycardia   ASSESSMENT AND PLAN  1. Multivessel CAD presenting with STEMi and severe LV dysfunction, s/p emergent CABG. Consider clopidogrel or Brilinta at DC. 2. Systolic CHF of [place] chronicity - anticipate at least partial EF recovery as stunned and hibernating myocardium is recruited. Meanwhile, titrate ACEi and beta blockers gradually. Anticipate need for diuretics at least temporarily. Echo pending review. 3. Mild hyperlipidemia (LDL may be artefactually low due to acute illness) - will need  statin   Sanda Klein, MD, [facility] [phone] office [phone] pager 06/27/2016 12:42 PM

## 2016-06-28 ENCOUNTER — Inpatient Hospital Stay (HOSPITAL_COMMUNITY): Payer: 59

## 2016-06-28 LAB — BASIC METABOLIC PANEL
Anion gap: 4 — ABNORMAL LOW (ref 5–15)
BUN: 9 mg/dL (ref 6–20)
CO2: 25 mmol/L (ref 22–32)
Calcium: 8.3 mg/dL — ABNORMAL LOW (ref 8.9–10.3)
Chloride: 109 mmol/L (ref 101–111)
Creatinine, Ser: 0.9 mg/dL (ref 0.61–1.24)
GFR calc Af Amer: >60 mL/min (ref >60)
GFR calc non Af Amer: >60 mL/min (ref >60)
Glucose, Bld: 111 mg/dL — ABNORMAL HIGH (ref 65–99)
Potassium: 4 mmol/L (ref 3.5–5.1)
Sodium: 138 mmol/L (ref 135–145)

## 2016-06-28 LAB — CBC
HCT: 28.2 % — ABNORMAL LOW (ref 39.0–52.0)
Hemoglobin: 8.9 g/dL — ABNORMAL LOW (ref 13.0–17.0)
MCH: 26.2 pg (ref 26.0–34.0)
MCHC: 31.6 g/dL (ref 30.0–36.0)
MCV: 82.9 fL (ref 78.0–100.0)
Platelets: 136 10*3/uL — ABNORMAL LOW (ref 150–400)
RBC: 3.4 MIL/uL — ABNORMAL LOW (ref 4.22–5.81)
RDW: 14.7 % (ref 11.5–15.5)
WBC: 12 10*3/uL — ABNORMAL HIGH (ref 4.0–10.5)

## 2016-06-28 LAB — GLUCOSE, CAPILLARY
Glucose-Capillary: 105 mg/dL — ABNORMAL HIGH (ref 65–99)
Glucose-Capillary: 114 mg/dL — ABNORMAL HIGH (ref 65–99)
Glucose-Capillary: 122 mg/dL — ABNORMAL HIGH (ref 65–99)
Glucose-Capillary: 104 mg/dL — ABNORMAL HIGH (ref 65–99)

## 2016-06-28 MED ORDER — METOPROLOL TARTRATE 25 MG/10 ML ORAL SUSPENSION: 12.5000 mg | Freq: Two times a day (BID) | ORAL | Status: DC

## 2016-06-28 MED ORDER — METOPROLOL TARTRATE 12.5 MG HALF TABLET
37.5000 mg | ORAL_TABLET | Freq: Two times a day (BID) | ORAL | Status: DC
  Administered 2016-06-28 (×2): 37.5 mg via ORAL
  Filled 2016-06-28 (×2): qty 1

## 2016-06-28 NOTE — Progress Notes (Signed)
[address]       [place] [address]             [phone]      3 Days Post-Op Procedure(s) (LRB): CORONARY ARTERY BYPASS GRAFTING (CABG) x (LIMA to LAD, SVG to OM, SVG SEQUENTIALLY to PDA and PLB) with EVH from RIGHT THIGH and LOWER LEG ([name]) Subjective: Looks and feels well  Objective: Vital signs in last 24 hours: Temp:  [98.5 F (36.9 C)-98.8 F (37.1 C)] 98.6 F (37 C) (07/30 0405) Pulse Rate:  [107-115] 108 (07/30 0405) Cardiac Rhythm: Sinus tachycardia (07/30 0842) Resp:  [18-26] 18 (07/30 0405) BP: (108-122)/(70-80) 117/70 (07/30 0405) SpO2:  [97 %-100 %] 97 % (07/30 0405) Weight:  [171 lb 11.8 oz (77.9 kg)] 171 lb 11.8 oz (77.9 kg) (07/30 0405)  Hemodynamic parameters for last 24 hours:    Intake/Output from previous day: 07/29 0701 - 07/30 0700 In: 480 [P.O.:480] Out: 1725 [Urine:1725] Intake/Output this shift: No intake/output data recorded.  General appearance: alert, cooperative and no distress Heart: regular rate and rhythm and tachy Lungs: clear to auscultation bilaterally Abdomen: benign Extremities: min edema Wound: incis healing well  Lab Results:  Recent Labs  06/27/16 0448 06/28/16 0307  WBC 11.4* 12.0*  HGB 8.7* 8.9*  HCT 27.8* 28.2*  PLT 146* 136*   BMET:  Recent Labs  06/27/16 0448 06/28/16 0307  NA 134* 138  K 3.6 4.0  CL 107 109  CO2 23 25  GLUCOSE 106* 111*  BUN 7 9  CREATININE 0.79 0.90  CALCIUM 8.0* 8.3*    PT/INR:  Recent Labs  06/27/16 0448  LABPROT 17.1*  INR 1.38   ABG    Component Value Date/Time   PHART 7.323 (L) 06/26/2016 1112   HCO3 18.6 (L) 06/26/2016 1112   TCO2 21 06/26/2016 1652   ACIDBASEDEF 7.0 (H) 06/26/2016 1112   O2SAT 98.0 06/26/2016 1112   CBG (last 3)   Recent Labs  06/27/16 1624 06/27/16 2104 06/28/16 0617  GLUCAP 144* 100* 105*    Meds Scheduled Meds: . acetaminophen  1,000 mg Oral Q6H   Or  . acetaminophen (TYLENOL) oral liquid 160 mg/5 mL   1,000 mg Per Tube Q6H  . aspirin EC  325 mg Oral Daily   Or  . aspirin  324 mg Per Tube Daily  . atorvastatin  80 mg Oral q1800  . bisacodyl  10 mg Oral Daily   Or  . bisacodyl  10 mg Rectal Daily  . docusate sodium  200 mg Oral Daily  . enoxaparin (LOVENOX) injection  40 mg Subcutaneous QHS  . furosemide  40 mg Oral Daily  . insulin aspart  0-24 Units Subcutaneous TID AC & HS  . lisinopril  2.5 mg Oral Daily  . metoprolol tartrate  25 mg Oral BID   Or  . metoprolol tartrate  12.5 mg Per Tube BID  . multivitamin with minerals  1 tablet Oral QAC supper  . pantoprazole  40 mg Oral Daily  . potassium chloride  20 mEq Oral Daily  . sodium chloride flush  3 mL Intravenous Q12H  . topiramate  100 mg Oral QHS   Continuous Infusions:  PRN Meds:.sodium chloride, alum & mag hydroxide-simeth, diphenhydrAMINE, magnesium hydroxide, metoprolol, nitroGLYCERIN, ondansetron (ZOFRAN) IV, oxyCODONE, sodium chloride flush, traMADol  Xrays Dg Chest 2 View  Result Date: 06/28/2016 CLINICAL DATA:  Status post CABG, postop day 3. EXAM: CHEST  2 VIEW COMPARISON:  06/27/2016 FINDINGS: A right IJ sheath has been removed. Mild cardiomegaly and CABG changes again noted. Very small bilateral pleural effusions and mild left basilar atelectasis again noted. There is no evidence of pulmonary edema or pneumothorax. IMPRESSION: Venous catheter sheath removed, otherwise little significant change. Very small bilateral pleural effusions and mild left basilar atelectasis. No evidence of pulmonary edema or pneumothorax. Electronically Signed   By: Margarette Canada M.D.   On: 06/28/2016 07:47  Dg Chest Port 1 View  Result Date: 06/27/2016 CLINICAL DATA:  Status post CABG. EXAM: PORTABLE CHEST 1 VIEW COMPARISON:  June 26, 2016 FINDINGS: The ET and NG tubes have been removed. A sheath remains in the right internal jugular vein but the PA catheter has been removed. No pneumothorax. No pulmonary nodules, masses, or focal infiltrates.  Mild atelectasis in the left retrocardiac region. No overt edema. IMPRESSION: Most of the support apparatus is been removed. No acute abnormalities. Electronically Signed   By: Dorise Bullion III M.D   On: 06/27/2016 07:57   Assessment/Plan: S/P Procedure(s) (LRB): CORONARY ARTERY BYPASS GRAFTING (CABG) x (LIMA to LAD, SVG to OM, SVG SEQUENTIALLY to PDA and PLB) with EVH from RIGHT THIGH and LOWER LEG ([name])  1 doing well 2 increase beta blocker dose slowly 3 BP control is good- cont current ACE-I dose 4 genlte diuresis for mild volume overload 5 labs are stable    LOS: 2 days    GOLD,WAYNE E 06/28/2016

## 2016-06-29 DIAGNOSIS — Z951 Presence of aortocoronary bypass graft: Secondary | ICD-10-CM

## 2016-06-29 LAB — CBC
HCT: 27.9 % — ABNORMAL LOW (ref 39.0–52.0)
Hemoglobin: 8.8 g/dL — ABNORMAL LOW (ref 13.0–17.0)
MCH: 26 pg (ref 26.0–34.0)
MCHC: 31.5 g/dL (ref 30.0–36.0)
MCV: 82.5 fL (ref 78.0–100.0)
Platelets: 165 10*3/uL (ref 150–400)
RBC: 3.38 MIL/uL — ABNORMAL LOW (ref 4.22–5.81)
RDW: 14.8 % (ref 11.5–15.5)
WBC: 10.7 10*3/uL — ABNORMAL HIGH (ref 4.0–10.5)

## 2016-06-29 LAB — TYPE AND SCREEN
ABO/RH(D): B POS
Antibody Screen: NEGATIVE
Unit division: 0
Unit division: 0
Unit division: 0
Unit division: 0

## 2016-06-29 LAB — GLUCOSE, CAPILLARY: Glucose-Capillary: 95 mg/dL (ref 65–99)

## 2016-06-29 MED ORDER — METOPROLOL TARTRATE 50 MG PO TABS
50.0000 mg | ORAL_TABLET | Freq: Two times a day (BID) | ORAL | Status: DC
  Administered 2016-06-29 – 2016-06-30 (×3): 50 mg via ORAL
  Filled 2016-06-29 (×3): qty 1

## 2016-06-29 MED ORDER — CLOPIDOGREL BISULFATE 75 MG PO TABS
75.0000 mg | ORAL_TABLET | Freq: Every day | ORAL | Status: DC
Start: 2016-06-29 — End: 2016-06-30
  Administered 2016-06-29 – 2016-06-30 (×2): 75 mg via ORAL
  Filled 2016-06-29 (×2): qty 1

## 2016-06-29 MED FILL — Electrolyte-R (PH 7.4) Solution: INTRAVENOUS | Qty: 3000 | Status: AC

## 2016-06-29 MED FILL — Lidocaine HCl IV Inj 20 MG/ML: INTRAVENOUS | Qty: 5 | Status: AC

## 2016-06-29 MED FILL — Sodium Bicarbonate IV Soln 8.4%: INTRAVENOUS | Qty: 50 | Status: AC

## 2016-06-29 MED FILL — Heparin Sodium (Porcine) Inj 1000 Unit/ML: INTRAMUSCULAR | Qty: 10 | Status: AC

## 2016-06-29 MED FILL — Sodium Chloride IV Soln 0.9%: INTRAVENOUS | Qty: 2000 | Status: AC

## 2016-06-29 MED FILL — Mannitol IV Soln 20%: INTRAVENOUS | Qty: 500 | Status: AC

## 2016-06-29 NOTE — Discharge Instructions (Signed)
Coronary Artery Bypass Grafting, Care After °Refer to this sheet in the next few weeks. These instructions provide you with information on caring for yourself after your procedure. Your health care provider may also give you more specific instructions. Your treatment has been planned according to current medical practices, but problems sometimes occur. Call your health care provider if you have any problems or questions after your procedure. °WHAT TO EXPECT AFTER THE PROCEDURE °Recovery from surgery will be different for everyone. Some people feel well after 3 or 4 weeks, while for others it takes longer. After your procedure, it is typical to have the following: °· Nausea and a lack of appetite.   °· Constipation. °· Weakness and fatigue.   °· Depression or irritability.   °· Pain or discomfort at your incision site. °HOME CARE INSTRUCTIONS °· Take medicines only as directed by your health care provider. Do not stop taking medicines or start any new medicines without first checking with your health care provider. °· Take your pulse as directed by your health care provider. °· Perform deep breathing as directed by your health care provider. If you were given a device called an incentive spirometer, use it to practice deep breathing several times a day. Support your chest with a pillow or your arms when you take deep breaths or cough. °· Keep incision areas clean, dry, and protected. Remove or change any bandages (dressings) only as directed by your health care provider. You may have skin adhesive strips over the incision areas. Do not take the strips off. They will fall off on their own. °· Check incision areas daily for any swelling, redness, or drainage. °· If incisions were made in your legs, do the following: °¨ Avoid crossing your legs.   °¨ Avoid sitting for long periods of time. Change positions every 30 minutes.   °¨ Elevate your legs when you are sitting. °· Wear compression stockings as directed by your  health care provider. These stockings help keep blood clots from forming in your legs. °· Take showers once your health care provider approves. Until then, only take sponge baths. Pat incisions dry. Do not rub incisions with a washcloth or towel. Do not take baths, swim, or use a hot tub until your health care provider approves. °· Eat foods that are high in fiber, such as raw fruits and vegetables, whole grains, beans, and nuts. Meats should be lean cut. Avoid canned, processed, and fried foods. °· Drink enough fluid to keep your urine clear or pale yellow. °· Weigh yourself every day. This helps identify if you are retaining fluid that may make your heart and lungs work harder. °· Rest and limit activity as directed by your health care provider. You may be instructed to: °¨ Stop any activity at once if you have chest pain, shortness of breath, irregular heartbeats, or dizziness. Get help right away if you have any of these symptoms. °¨ Move around frequently for short periods or take short walks as directed by your health care provider. Increase your activities gradually. You may need physical therapy or cardiac rehabilitation to help strengthen your muscles and build your endurance. °¨ Avoid lifting, pushing, or pulling anything heavier than 10 lb (4.5 kg) for at least 6 weeks after surgery. °· Do not drive until your health care provider approves.  °· Ask your health care provider when you may return to work. °· Ask your health care provider when you may resume sexual activity. °· Keep all follow-up visits as directed by your health care   provider. This is important. °SEEK MEDICAL CARE IF: °· You have swelling, redness, increasing pain, or drainage at the site of an incision. °· You have a fever. °· You have swelling in your ankles or legs. °· You have pain in your legs.   °· You gain 2 or more pounds (0.9 kg) a day. °· You are nauseous or vomit. °· You have diarrhea.  °SEEK IMMEDIATE MEDICAL CARE IF: °· You have  chest pain that goes to your jaw or arms. °· You have shortness of breath.   °· You have a fast or irregular heartbeat.   °· You notice a "clicking" in your breastbone (sternum) when you move.   °· You have numbness or weakness in your arms or legs. °· You feel dizzy or light-headed.   °MAKE SURE YOU: °· Understand these instructions. °· Will watch your condition. °· Will get help right away if you are not doing well or get worse. °  °This information is not intended to replace advice given to you by your health care provider. Make sure you discuss any questions you have with your health care provider. °  °Document Released: 06/05/2005 Document Revised: 12/07/2014 Document Reviewed: 04/25/2013 °Elsevier Interactive Patient Education ©2016 [employer] ° °Endoscopic Saphenous Vein Harvesting, Care After °Refer to this sheet in the next few weeks. These instructions provide you with information on caring for yourself after your procedure. Your health care provider may also give you more specific instructions. Your treatment has been planned according to current medical practices, but problems sometimes occur. Call your health care provider if you have any problems or questions after your procedure. °HOME CARE INSTRUCTIONS °Medicine °· Take whatever pain medicine your surgeon prescribes. Follow the directions carefully. Do not take over-the-counter pain medicine unless your surgeon says it is okay. Some pain medicine can cause bleeding problems for several weeks after surgery. °· Follow your surgeon's instructions about driving. You will probably not be permitted to drive after heart surgery. °· Take any medicines your surgeon prescribes. Any medicines you took before your heart surgery should be checked with your health care provider before you start taking them again. °Wound care °· If your surgeon has prescribed an elastic bandage or stocking, ask how long you should wear it. °· Check the area around your surgical  cuts (incisions) whenever your bandages (dressings) are changed. Look for any redness or swelling. °· You will need to return to have the stitches (sutures) or staples taken out. Ask your surgeon when to do that. °· Ask your surgeon when you can shower or bathe. °Activity °· Try to keep your legs raised when you are sitting. °· Do any exercises your health care providers have given you. These may include deep breathing exercises, coughing, walking, or other exercises. °SEEK MEDICAL CARE IF: °· You have any questions about your medicines. °· You have more leg pain, especially if your pain medicine stops working. °· New or growing bruises develop on your leg. °· Your leg swells, feels tight, or becomes red. °· You have numbness in your leg. °SEEK IMMEDIATE MEDICAL CARE IF: °· Your pain gets much worse. °· Blood or fluid leaks from any of the incisions. °· Your incisions become warm, swollen, or red. °· You have chest pain. °· You have trouble breathing. °· You have a fever. °· You have more pain near your leg incision. °MAKE SURE YOU: °· Understand these instructions. °· Will watch your condition. °· Will get help right away if you are not doing well or   get worse. °  °This information is not intended to replace advice given to you by your health care provider. Make sure you discuss any questions you have with your health care provider. °  °Document Released: 07/29/2011 Document Revised: 12/07/2014 Document Reviewed: 07/29/2011 °Elsevier Interactive Patient Education ©2016 Elsevier Inc. ° ° °

## 2016-06-29 NOTE — Progress Notes (Addendum)
      [address]       [place] [address]             [phone]      4 Days Post-Op Procedure(s) (LRB): CORONARY ARTERY BYPASS GRAFTING (CABG) x (LIMA to LAD, SVG to OM, SVG SEQUENTIALLY to PDA and PLB) with EVH from RIGHT THIGH and LOWER LEG ([name])   Subjective:  Benjamin Robbins is feeling pretty good.  He is ambulating without difficulty.  Objective: Vital signs in last 24 hours: Temp:  [98.2 F (36.8 C)-98.6 F (37 C)] 98.2 F (36.8 C) (07/31 0654) Pulse Rate:  [107-111] 107 (07/31 0654) Cardiac Rhythm: Sinus tachycardia (07/30 1900) Resp:  [18] 18 (07/31 0654) BP: (124-127)/(78-85) 124/78 (07/31 0654) SpO2:  [97 %-98 %] 98 % (07/31 0654) Weight:  [170 lb 3.1 oz (77.2 kg)] 170 lb 3.1 oz (77.2 kg) (07/31 0653)  Intake/Output from previous day: 07/30 0701 - 07/31 0700 In: -  Out: 700 [Urine:700] Intake/Output this shift: No intake/output data recorded.  General appearance: alert, cooperative and no distress Heart: regular rate and rhythm Lungs: clear to auscultation bilaterally Abdomen: soft, non-tender; bowel sounds normal; no masses,  no organomegaly Extremities: edema trace Wound: clean and dry  Lab Results:  Recent Labs  06/28/16 0307 06/29/16 0309  WBC 12.0* 10.7*  HGB 8.9* 8.8*  HCT 28.2* 27.9*  PLT 136* 165   BMET:  Recent Labs  06/27/16 0448 06/28/16 0307  NA 134* 138  K 3.6 4.0  CL 107 109  CO2 23 25  GLUCOSE 106* 111*  BUN 7 9  CREATININE 0.79 0.90  CALCIUM 8.0* 8.3*    PT/INR:  Recent Labs  06/27/16 0448  LABPROT 17.1*  INR 1.38   ABG    Component Value Date/Time   PHART 7.323 (L) 06/26/2016 1112   HCO3 18.6 (L) 06/26/2016 1112   TCO2 21 06/26/2016 1652   ACIDBASEDEF 7.0 (H) 06/26/2016 1112   O2SAT 98.0 06/26/2016 1112   CBG (last 3)   Recent Labs  06/28/16 1619 06/28/16 2124 06/29/16 0659  GLUCAP 122* 104* 95    Assessment/Plan: S/P Procedure(s) (LRB): CORONARY ARTERY BYPASS GRAFTING (CABG) x  (LIMA to LAD, SVG to OM, SVG SEQUENTIALLY to PDA and PLB) with EVH from RIGHT THIGH and LOWER LEG ([name])  1. CV- Sinus Tach, BP controlled- will increase Lopressor to 50 mg BID, continue low dose ACE 2. Pulm- no acute issues, off oxygen, continue IS 3. Renal- creatinine has been stable, weight is mildly elevated, continue Lasix 4. CBGs controlled, not a diabetic, will d/c SSIP 5. Dispo- patient stable, d/c EPW, increase Lopressor, likely home in AM   LOS: 3 days    Ahmed Prima, ERIN 06/29/2016  Chart reviewed, patient examined, agree with above. He is making good progress. Will plan to send home on ASA and Plavix.

## 2016-06-29 NOTE — Progress Notes (Signed)
CARDIAC REHAB PHASE I   PRE:  Rate/Rhythm: 110 ST  BP:  Sitting: 130/91        SaO2: 100 RA  MODE:  Ambulation: 700 ft   POST:  Rate/Rhythm: 113 ST  BP:  Sitting: 127/84         SaO2: 100 RA  Pt has been ambulating independently. Pt agreeable to ambulate with cardiac rehab. Pt ambulated 700 ft on RA, standby assist, steady gait, tolerated well with no complaints. Encouraged additional ambulation today, IS. Pt to recliner after walk, feet elevated, call bell within reach. Will follow.   [id number] Lenna Sciara, RN, BSN 06/29/2016 9:56 AM

## 2016-06-29 NOTE — Consult Note (Signed)
   [facility] CM Inpatient Consult   06/29/2016  ACHYUT CANNADY 08-19-1956 [id number]   Went to bedside to speak with Mr. Kren on behalf of Link to [facility] Care Management program for [facility] employees/dependents with [facility] insurance. Discussed Link to Wellness program. Mr. Defusco denies needs at this. Discussed that he will receive post hospital discharge call. Confirmed best contact number as [phone]. Link to [employer] and contact information left at bedside. Appreciative of visit.   Marthenia Rolling, MSN-Ed, RN,BSN [facility] Liaison [phone]

## 2016-06-29 NOTE — Anesthesia Postprocedure Evaluation (Signed)
Anesthesia Post Note  Patient: HUDSON ASCOLESE  Procedure(s) Performed: Procedure(s) (LRB): CORONARY ARTERY BYPASS GRAFTING (CABG) x (LIMA to LAD, SVG to OM, SVG SEQUENTIALLY to PDA and PLB) with EVH from RIGHT THIGH and LOWER LEG ([name])  Patient location during evaluation: SICU Anesthesia Type: General Level of consciousness: sedated and patient remains intubated per anesthesia plan Pain management: pain level controlled Vital Signs Assessment: post-procedure vital signs reviewed and stable Respiratory status: patient remains intubated per anesthesia plan Cardiovascular status: stable Anesthetic complications: no     Last Vitals:  Vitals:   06/28/16 2000 06/29/16 0654  BP: 124/78 124/78  Pulse: (!) 111 (!) 107  Resp: 18 18  Temp: 36.9 C 36.8 C    Last Pain:  Vitals:   06/29/16 0654  TempSrc: Oral  PainSc:    Pain Goal: Patients Stated Pain Goal: 3 (06/27/16 0400)               Catalina Gravel

## 2016-06-29 NOTE — Discharge Summary (Signed)
Physician Discharge Summary  Patient ID: Benjamin Robbins MRN: [id number] DOB/AGE: 1956-03-19 60 y.o.  Admit date: 06/25/2016 Discharge date: 06/30/2016  Admission Diagnoses:  Patient Active Problem List   Diagnosis Date Noted  . CAD (coronary artery disease) 06/26/2016  . ST elevation myocardial infarction (STEMI) of anterior wall, initial episode of care ([place]) 06/25/2016  . GERD (gastroesophageal reflux disease) 06/25/2016   Discharge Diagnoses:   Patient Active Problem List   Diagnosis Date Noted  . S/P CABG x 4 06/29/2016  . CAD (coronary artery disease) 06/26/2016  . ST elevation myocardial infarction (STEMI) of anterior wall, initial episode of care ([place]) 06/25/2016  . GERD (gastroesophageal reflux disease) 06/25/2016   Discharged Condition: good  History of Present Illness:  Benjamin Robbins is a 60 yo white male with H/O Prostate Cancer.  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  The patient rated it 5/10.  He was pale and diaphoretic.  He was taken to the ED where EKG was obtained and showed anterior STEMI.  Hospital Course:   He was taken emergently to the cath lab where he was found to have severe 3 vessel CAD with reduced EF. It was felt emergent CABG would be indicated and TCTS consult was obtained.  Dr. Cyndia Bent evaluated the patient and was in agreement.  The risks and benefits of the procedure were explained to the patient and he was agreeable to proceed.    He was taken to the operating room.  He underwent CABG x 4 utilizing LIMA to LAD, SVG to OM,  And Sequential SVG PDA and PLB.  He also underwent endoscopic harvest of greater saphenous vein from right leg.  He tolerated the procedure without difficulty and was taken to the SICU in stable condition.  During his stay in the SICU the patient had runs of SVT.  He was treated with Lidocaine for this.  He was weaned and extubated on POD #1.  His chest tubes and arterial  lines were removed without difficulty.  He was ambulating independently and felt stable for transfer to the step down unit on POD #2.  The patient continues to make progress.  He is maintaining NSR.  His blood pressure is controlled.  His pacing wires were removed without difficulty.  He continues to ambulate independently.  His pain is controlled.  He has been weaned off oxygen.  He is felt medically stable for discharge home today.    Significant Diagnostic Studies: angiography:    Ost LM to LM lesion, 80 %stenosed.  Ost LAD to Prox LAD lesion, 99 %stenosed.  Mid RCA to Dist RCA lesion, 100 %stenosed.  Ost 2nd Mrg to 2nd Mrg lesion, 90 %stenosed.  1st Mrg lesion, 70 %stenosed.  Mid LAD to Dist LAD lesion, 75 %stenosed.  There is severe left ventricular systolic dysfunction.  LV end diastolic pressure is mildly elevated.  The left ventricular ejection fraction is 25-35% by visual estimate.  Treatments: surgery:   1. Emergency Median Sternotomy 2. Extracorporeal circulation 3.   Coronary artery bypass grafting x 4   Left internal mammary graft to the LAD  SVG to OM  Sequential SVG to PDA and PL  4.   Endoscopic vein harvest from the right leg  Disposition: 01-Home or Self Care   Discharge medications:  The patient has been discharged on:   1.Beta Blocker:  Yes [ x  ]  No   [   ]                              If No, reason:  2.Ace Inhibitor/ARB: Yes [ x  ]                                     No  [    ]                                     If No, reason:  3.Statin:   Yes [ x  ]                  No  [   ]                  If No, reason:  4.Ecasa:  Yes  [ x  ]                  No   [   ]                  If No, reason:      Medication List    STOP taking these medications   ibuprofen 200 MG tablet Commonly known as:  ADVIL,MOTRIN     TAKE these medications   acetaminophen 500 MG tablet Commonly known as:  TYLENOL Take  1,000 mg by mouth every 6 (six) hours as needed for moderate pain.   aspirin 81 MG tablet Take 81 mg by mouth daily.   atorvastatin 80 MG tablet Commonly known as:  LIPITOR Take 1 tablet (80 mg total) by mouth daily at 6 PM.   clopidogrel 75 MG tablet Commonly known as:  PLAVIX Take 1 tablet (75 mg total) by mouth daily.   esomeprazole 40 MG capsule Commonly known as:  NEXIUM Take 40 mg by mouth daily as needed (reflux).   furosemide 40 MG tablet Commonly known as:  LASIX Take 1 tablet (40 mg total) by mouth daily.   lisinopril 2.5 MG tablet Commonly known as:  PRINIVIL,ZESTRIL Take 1 tablet (2.5 mg total) by mouth daily.   metoprolol 50 MG tablet Commonly known as:  LOPRESSOR Take 1 tablet (50 mg total) by mouth 2 (two) times daily.   MULTIVITAMINS PO Take 1 tablet by mouth daily.   oxyCODONE 5 MG immediate release tablet Commonly known as:  Oxy IR/ROXICODONE Take 1-2 tablets (5-10 mg total) by mouth every 3 (three) hours as needed for severe pain.   potassium chloride SA 20 MEQ tablet Commonly known as:  K-DUR,KLOR-CON Take 1 tablet (20 mEq total) by mouth daily.   topiramate 100 MG tablet Commonly known as:  TOPAMAX Take 100 mg by mouth at bedtime.       Discharge Instructions    AMB Referral to [facility] [facility] Management    Complete by:  As directed   Please assign UMR member for post toc. Likely to discharge tomorrow. Please call with questions. Marthenia Rolling, [place], [facility] [phone]   Reason for consult:  Please assign UMR member for post toc   Expected date of contact:  1-3 days (reserved for hospital discharges)       Follow-up Information    Fernande Boyden  Cyndia Bent, MD Follow up on 07/29/2016.   Specialty:  Cardiothoracic Surgery Why:  Appointment is at 12:00, Please get CXR at 11:30 prior to your appointment with Dr. Lyn Henri information: [place] [address] [phone]        [name], PA-C Follow up on 07/24/2016.   Specialties:  Cardiology, Radiology Why:  Appointment is at 2:30 Contact information: [address] [place] [address] [phone]        [facility] Follow up on 07/07/2016.   Specialty:  Cardiothoracic Surgery Why:  Appointment is at 10:00 with nurse for suture removal Contact information: [address] [place], [place] [phone]          Signed: Ellwood Handler 06/30/2016, 8:06 AM

## 2016-06-29 NOTE — Progress Notes (Signed)
Pacing wires removed with Colletta [place] RN per protocol. Pt VSS. Pt and wife instructed on 1 hour bedrest.  Fritz Pickerel, RN

## 2016-06-30 ENCOUNTER — Encounter (HOSPITAL_COMMUNITY): Payer: Self-pay | Admitting: Cardiovascular Disease

## 2016-06-30 LAB — CBC
HCT: 30.7 % — ABNORMAL LOW (ref 39.0–52.0)
Hemoglobin: 9.5 g/dL — ABNORMAL LOW (ref 13.0–17.0)
MCH: 25.3 pg — ABNORMAL LOW (ref 26.0–34.0)
MCHC: 30.9 g/dL (ref 30.0–36.0)
MCV: 81.6 fL (ref 78.0–100.0)
Platelets: 218 10*3/uL (ref 150–400)
RBC: 3.76 MIL/uL — ABNORMAL LOW (ref 4.22–5.81)
RDW: 14.5 % (ref 11.5–15.5)
WBC: 10.1 10*3/uL (ref 4.0–10.5)

## 2016-06-30 MED ORDER — LISINOPRIL 2.5 MG PO TABS: 2.5000 mg | ORAL_TABLET | Freq: Every day | ORAL | 3 refills | Status: DC

## 2016-06-30 MED ORDER — METOPROLOL TARTRATE 50 MG PO TABS: 50.0000 mg | ORAL_TABLET | Freq: Two times a day (BID) | ORAL | 3 refills | Status: DC

## 2016-06-30 MED ORDER — CLOPIDOGREL BISULFATE 75 MG PO TABS: 75.0000 mg | ORAL_TABLET | Freq: Every day | ORAL | Status: DC

## 2016-06-30 MED ORDER — ATORVASTATIN CALCIUM 80 MG PO TABS: 80.0000 mg | ORAL_TABLET | Freq: Every day | ORAL | 3 refills | Status: DC

## 2016-06-30 MED ORDER — FUROSEMIDE 40 MG PO TABS: 40.0000 mg | ORAL_TABLET | Freq: Every day | ORAL | 0 refills | Status: DC

## 2016-06-30 MED ORDER — OXYCODONE HCL 5 MG PO TABS: 5.0000 mg | ORAL_TABLET | ORAL | 0 refills | Status: DC | PRN

## 2016-06-30 MED ORDER — CLOPIDOGREL BISULFATE 75 MG PO TABS: 75.0000 mg | ORAL_TABLET | Freq: Every day | ORAL | 3 refills | Status: DC

## 2016-06-30 MED ORDER — POTASSIUM CHLORIDE CRYS ER 20 MEQ PO TBCR: 20.0000 meq | EXTENDED_RELEASE_TABLET | Freq: Every day | ORAL | 0 refills | Status: DC

## 2016-06-30 MED FILL — CLOPIDOGREL 75 MG TABLET: 75 | 30 days supply | Qty: 30 | Fill #0

## 2016-06-30 MED FILL — oxyCODONE HCL 5 MG TABS: 5 | 2 days supply | Qty: 30 | Fill #0

## 2016-06-30 MED FILL — FUROSEMIDE 40 MG TABLET: 40 | 3 days supply | Qty: 3 | Fill #0

## 2016-06-30 MED FILL — ATORVASTATIN 80 MG TABLET: 80 | 90 days supply | Qty: 90 | Fill #0

## 2016-06-30 MED FILL — METOPROLOL TARTRATE 50 MG T: 50 | 45 days supply | Qty: 90 | Fill #0

## 2016-06-30 MED FILL — POTASSIUM CL ER 20 MEQ TABL: 20 | 3 days supply | Qty: 3 | Fill #0

## 2016-06-30 MED FILL — LISINOPRIL 2.5 MG TABLET: 2.5 | 90 days supply | Qty: 90 | Fill #0

## 2016-06-30 NOTE — Progress Notes (Addendum)
      [address]       [place] [address]             [phone]      5 Days Post-Op Procedure(s) (LRB): CORONARY ARTERY BYPASS GRAFTING (CABG) x (LIMA to LAD, SVG to OM, SVG SEQUENTIALLY to PDA and PLB) with EVH from RIGHT THIGH and LOWER LEG ([name])   Subjective:  Mr. Chacko feels great.  He denies chest pain and shortness of breath. Wants to go home.  + ambulation   + BM  Objective: Vital signs in last 24 hours: Temp:  [97.9 F (36.6 C)-98.7 F (37.1 C)] 98.1 F (36.7 C) (08/01 0514) Pulse Rate:  [101-109] 101 (08/01 0514) Cardiac Rhythm: Sinus tachycardia (07/31 1900) Resp:  [18] 18 (08/01 0514) BP: (119-126)/(73-85) 119/73 (08/01 0514) SpO2:  [99 %-100 %] 99 % (08/01 0514) Weight:  [163 lb 9.6 oz (74.2 kg)] 163 lb 9.6 oz (74.2 kg) (08/01 0500)  Intake/Output from previous day: 07/31 0701 - 08/01 0700 In: 360 [P.O.:360] Out: 500 [Urine:500]  General appearance: alert, cooperative and no distress Heart: regular rate and rhythm Lungs: clear to auscultation bilaterally Abdomen: soft, non-tender; bowel sounds normal; no masses,  no organomegaly Extremities: edema minimal Wound: clean and dry  Lab Results:  Recent Labs  06/29/16 0309 06/30/16 0312  WBC 10.7* 10.1  HGB 8.8* 9.5*  HCT 27.9* 30.7*  PLT 165 218   BMET:  Recent Labs  06/28/16 0307  NA 138  K 4.0  CL 109  CO2 25  GLUCOSE 111*  BUN 9  CREATININE 0.90  CALCIUM 8.3*    PT/INR: No results for input(s): LABPROT, INR in the last 72 hours. ABG    Component Value Date/Time   PHART 7.323 (L) 06/26/2016 1112   HCO3 18.6 (L) 06/26/2016 1112   TCO2 21 06/26/2016 1652   ACIDBASEDEF 7.0 (H) 06/26/2016 1112   O2SAT 98.0 06/26/2016 1112   CBG (last 3)   Recent Labs  06/28/16 1619 06/28/16 2124 06/29/16 0659  GLUCAP 122* 104* 95    Assessment/Plan: S/P Procedure(s) (LRB): CORONARY ARTERY BYPASS GRAFTING (CABG) x (LIMA to LAD, SVG to OM, SVG SEQUENTIALLY to PDA and PLB)  with EVH from RIGHT THIGH and LOWER LEG ([name])  1. Sinus Tach, BP remains controlled- continue Lopressor, Lisinopril 2. Pulm- no acute issues, continue IS 3. Renal- creatinine has been stable, weight is at baseline 4. Dispo- patient stable, will d/c home today   LOS: 4 days    BARRETT, ERIN 06/30/2016

## 2016-06-30 NOTE — Progress Notes (Signed)
Ed completed with pt and wife. Voiced understanding and requests his referral be sent to [facility]. Set up d/c video.  [id number] Yves Dill CES, ACSM 11:07 AM 06/30/2016

## 2016-07-01 ENCOUNTER — Other Ambulatory Visit: Payer: Self-pay | Admitting: *Deleted

## 2016-07-01 ENCOUNTER — Encounter: Payer: Self-pay | Admitting: *Deleted

## 2016-07-01 NOTE — Patient Outreach (Signed)
[facility]) Care Management  07/01/2016  Benjamin Robbins [birth year] [id number]  Subjective: Telephone call to patient's home number, spoke with patient, and HIPAA verified.    Patient gave [facility] verbal authorization to speak with wifeAntrone Zeh) regarding his healthcare needs.  Spoke with patient, wife via speaker phone, discussed [facility] Management UMR Transition of care follow up, and both in agreement to complete follow up.   Patient states he is doing well.   RNCM educated patient on the importance of hospital follow up with primary MD and patient voiced understanding.  States he is planning to follow up with [facility] customer service regarding coverage of overdue shingles vaccine and if covered will schedule vaccine with hospital follow up appointment if appropriate.   Patient states he is planning to follow up with [employer] today to initiate the family medical leave act paperwork.  States he uses the [employer]. States he does not have any transition of care, care coordination, pharmacy, disease management, disease education, disease monitoring, community resources, or transportation needs at this time.  Patient in agreement to receive [facility] Management information for future reference.  Objective: Per chart review: Patient hospitalized 06/25/16 - 06/30/16 for ST elevation myocardial infarction (STEMI) of anterior wall and CAD.   Patient had Coronary artery bypass grafting x 4.   Patient also has a history of prostate cancer.    Assessment: Received UMR Transition of care referral on 06/29/16.    Transition of care follow up completed and no Telephonic RNCM needs at this time.    Plan: RNCM will send patient successful outreach letter, [facility] pamphlet, and magnet.  RNCM will send case closure due to follow up completed / no care management needs request to [facility].   Perian Tedder H. Annia Friendly, BSN, [facility] Telephonic  CM Phone: [phone] Fax: [phone]

## 2016-07-07 ENCOUNTER — Encounter (INDEPENDENT_AMBULATORY_CARE_PROVIDER_SITE_OTHER): Payer: Self-pay

## 2016-07-07 DIAGNOSIS — G43109 Migraine with aura, not intractable, without status migrainosus: Secondary | ICD-10-CM | POA: Diagnosis not present

## 2016-07-07 DIAGNOSIS — Z951 Presence of aortocoronary bypass graft: Secondary | ICD-10-CM

## 2016-07-07 DIAGNOSIS — I2510 Atherosclerotic heart disease of native coronary artery without angina pectoris: Secondary | ICD-10-CM | POA: Diagnosis not present

## 2016-07-08 DIAGNOSIS — Z736 Limitation of activities due to disability: Secondary | ICD-10-CM

## 2016-07-16 ENCOUNTER — Encounter (HOSPITAL_COMMUNITY): Payer: Self-pay

## 2016-07-24 ENCOUNTER — Encounter: Payer: Self-pay | Admitting: Cardiology

## 2016-07-24 ENCOUNTER — Ambulatory Visit (INDEPENDENT_AMBULATORY_CARE_PROVIDER_SITE_OTHER): Payer: 59 | Admitting: Cardiology

## 2016-07-24 VITALS — BP 120/72 | HR 69 | Ht 72.0 in | Wt 170.0 lb

## 2016-07-24 DIAGNOSIS — I25110 Atherosclerotic heart disease of native coronary artery with unstable angina pectoris: Secondary | ICD-10-CM | POA: Diagnosis not present

## 2016-07-24 DIAGNOSIS — I255 Ischemic cardiomyopathy: Secondary | ICD-10-CM

## 2016-07-24 DIAGNOSIS — Z951 Presence of aortocoronary bypass graft: Secondary | ICD-10-CM

## 2016-07-24 DIAGNOSIS — I2109 ST elevation (STEMI) myocardial infarction involving other coronary artery of anterior wall: Secondary | ICD-10-CM

## 2016-07-24 DIAGNOSIS — E785 Hyperlipidemia, unspecified: Secondary | ICD-10-CM | POA: Insufficient documentation

## 2016-07-24 MED ORDER — LISINOPRIL 5 MG PO TABS: 5.0000 mg | ORAL_TABLET | Freq: Every day | ORAL | 3 refills | Status: DC

## 2016-07-24 MED ORDER — CARVEDILOL 12.5 MG PO TABS: 12.5000 mg | ORAL_TABLET | Freq: Two times a day (BID) | ORAL | 3 refills | Status: DC

## 2016-07-24 MED FILL — LISINOPRIL 5 MG TABLET: 5 | 90 days supply | Qty: 90 | Fill #0

## 2016-07-24 MED FILL — CARVEDILOL 12.5 MG TABLET: 12.5 | 90 days supply | Qty: 180 | Fill #0

## 2016-07-24 NOTE — Assessment & Plan Note (Signed)
Urgent CABG x 4 with LIMA-LAD, SVG-OM, SVG-PD/PL secondary to severe multivessel CAD including 80% LM

## 2016-07-24 NOTE — Assessment & Plan Note (Signed)
STEMI at [facility] at [facility] in [place] supply)

## 2016-07-24 NOTE — Assessment & Plan Note (Signed)
Check Lipids and CMET in 6 weeks

## 2016-07-24 NOTE — Progress Notes (Signed)
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 He developed chest pain at work and was sent to the ED where he was diagnosed with an anterior STEMI. He was taken tot he cath lab and had severe multivessel CAD including an 80% LM and severe LVD. He was taken to the OR for urgent CABG by Dr Cyndia Bent. Post op was unremarkable. EF by echo on 7/28 was 30-35%. He is in the office today for follow up. He has fatigue but denies any unusual dyspnea, palpitations, or angina. He and his wife are planning a 500 mile car trip on 9/2/ and wanted to know if that was OK from our standpoint.    Current Outpatient Prescriptions  Medication Sig Dispense Refill  . aspirin 81 MG tablet Take 81 mg by mouth daily.    [name] atorvastatin (LIPITOR) 80 MG tablet Take 1 tablet (80 mg total) by mouth daily at 6 PM. 30 tablet 3  . clopidogrel (PLAVIX) 75 MG tablet Take 1 tablet (75 mg total) by mouth daily. 30 tablet 3  . esomeprazole (NEXIUM) 40 MG capsule Take 40 mg by mouth daily as needed (reflux).    [name] lisinopril (PRINIVIL,ZESTRIL) 5 MG tablet Take 1 tablet (5 mg total) by mouth daily. 90 tablet 3  . Multiple Vitamin (MULTIVITAMINS PO) Take 1 tablet by mouth daily.    [name] oxyCODONE (OXY IR/ROXICODONE) 5 MG immediate release tablet Take 1-2 tablets (5-10 mg total) by mouth every 3 (three) hours as needed for severe pain. 30 tablet 0  . topiramate (TOPAMAX) 100 MG tablet Take 100 mg by mouth at bedtime.    [name] VITAMIN D, ERGOCALCIFEROL, PO Take 5,000 Units by mouth daily.    . carvedilol (COREG) 12.5 MG tablet Take 1 tablet (12.5 mg total) by mouth 2 (two) times daily. 180 tablet 3   No current facility-administered medications for this visit.     No Known Allergies  Social History   Social History    . Marital status: Married    Spouse name: [name]  . Number of children: 3  . Years of education: [name]   Occupational History  . 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 Smoking status: Never Smoker  . Smokeless tobacco: Never Used  . Alcohol use No  . Drug use: No  . Sexual activity: Not on file   Other Topics Concern  . Not on file   Social History Narrative   Daily caffeine     Review of Systems: General: negative for chills, fever, night sweats or weight changes.  Cardiovascular: negative for chest pain, dyspnea on exertion, edema, orthopnea, palpitations, paroxysmal nocturnal dyspnea or shortness of breath Dermatological: negative for rash Respiratory: negative for cough or wheezing Urologic: negative for hematuria Abdominal: negative for nausea, vomiting, diarrhea, bright red blood per rectum, melena, or hematemesis Neurologic: negative for visual changes, syncope, or dizziness All other systems reviewed and are otherwise negative except as noted above.    Blood pressure 120/72, pulse 69, height 6' (1.829 m), weight 170 lb (77.1 kg).  General appearance: alert, cooperative, no distress and pale Neck: no carotid bruit and no JVD Lungs: clear to auscultation bilaterally Heart: regular rate and rhythm Extremities: extremities  normal, atraumatic, no cyanosis or edema Skin: Skin color, texture, turgor normal. No rashes or lesions Neurologic: Grossly normal  EKG NSR, Anterior/ lateral TWI and Qs  ASSESSMENT AND PLAN:   S/P CABG x 4 Urgent CABG x 4 with LIMA-LAD, SVG-OM, SVG-PD/PL secondary to severe multivessel CAD including 80% LM  STEMI of anterior wall, 06/25/16 STEMI at work-(works at [employer] in [place])  Cardiomyopathy, ischemic EF 25% pre op, 30-35% post op  Dyslipidemia Check Lipids and CMET in 6 weeks   PLAN  I told them I would have preferred they put this trip off if they could but they sound pretty set on taking it. It's been 4 weeks  and he has done well, it should be OK. I suggested they ask Dr Cyndia Bent about him driving. I did change his Lopressor to Coreg 12.5 mg BID and increased his Lisinopril to 5 mg daily. He'll f/u with Dr Gwenlyn Found in 4-6 weeks. He'll need an echo at 3 months post op and fasting lipids and a CMET in 6 weeks.  Benjamin Ransom PA-C 07/24/2016 2:36 PM

## 2016-07-24 NOTE — Patient Instructions (Addendum)
Medications  STOP Metoprolol  START Carvedilol 12.5mg  (1 tab) two times daily.  Increase Lisinopril to 5 mg daily.   Labwork  Your physician recommends that you return for lab work in 6 weeks: CMET, fasting Lipid   Follow-Up  Your physician recommends that you schedule a follow-up appointment in: 4-6 weeks with Dr. Gwenlyn Found. (first week of October)

## 2016-07-24 NOTE — Assessment & Plan Note (Signed)
EF 25% pre op, 30-35% post op

## 2016-07-28 ENCOUNTER — Other Ambulatory Visit: Payer: Self-pay | Admitting: Surgery

## 2016-07-28 DIAGNOSIS — Z951 Presence of aortocoronary bypass graft: Secondary | ICD-10-CM

## 2016-07-28 MED FILL — CLOPIDOGREL 75 MG TABLET: 75 | 30 days supply | Qty: 30 | Fill #1

## 2016-07-29 ENCOUNTER — Ambulatory Visit
Admission: RE | Admit: 2016-07-29 | Discharge: 2016-07-29 | Disposition: A | Discharge status: Home / Self Care | Payer: 59 | Source: Ambulatory Visit | Attending: Surgery | Admitting: Surgery

## 2016-07-29 ENCOUNTER — Encounter: Payer: Self-pay | Admitting: Surgery

## 2016-07-29 ENCOUNTER — Ambulatory Visit (INDEPENDENT_AMBULATORY_CARE_PROVIDER_SITE_OTHER): Payer: Self-pay | Admitting: Surgery

## 2016-07-29 VITALS — BP 107/70 | HR 85 | Resp 18 | Ht 72.0 in | Wt 170.0 lb

## 2016-07-29 DIAGNOSIS — R5383 Other fatigue: Secondary | ICD-10-CM | POA: Diagnosis not present

## 2016-07-29 DIAGNOSIS — Z951 Presence of aortocoronary bypass graft: Secondary | ICD-10-CM

## 2016-07-31 ENCOUNTER — Encounter: Payer: Self-pay | Admitting: Surgery

## 2016-07-31 NOTE — Progress Notes (Signed)
     HPI: Patient returns for routine postoperative follow-up having undergone emergent CABG x 4 on 06/26/2016. The patient's early postoperative recovery while in the hospital was notable for an uncomplicated postop course. Since hospital discharge the patient reports that he has been walking without chest pain or shortness of breath. His stamina is still low but slowly improving.   Current Outpatient Prescriptions  Medication Sig Dispense Refill  . aspirin 81 MG tablet Take 81 mg by mouth daily.    [name] atorvastatin (LIPITOR) 80 MG tablet Take 1 tablet (80 mg total) by mouth daily at 6 PM. 30 tablet 3  . carvedilol (COREG) 12.5 MG tablet Take 1 tablet (12.5 mg total) by mouth 2 (two) times daily. 180 tablet 3  . clopidogrel (PLAVIX) 75 MG tablet Take 1 tablet (75 mg total) by mouth daily. 30 tablet 3  . esomeprazole (NEXIUM) 40 MG capsule Take 40 mg by mouth daily as needed (reflux).    [name] lisinopril (PRINIVIL,ZESTRIL) 5 MG tablet Take 1 tablet (5 mg total) by mouth daily. 90 tablet 3  . Multiple Vitamin (MULTIVITAMINS PO) Take 1 tablet by mouth daily.    [name] topiramate (TOPAMAX) 100 MG tablet Take 100 mg by mouth at bedtime.    [name] VITAMIN D, ERGOCALCIFEROL, PO Take 5,000 Units by mouth daily.     No current facility-administered medications for this visit.     Physical Exam: BP 107/70 (BP Location: Right Arm, Patient Position: Sitting, Cuff Size: Normal)   Pulse 85   Resp 18   Ht 6' (1.829 m)   Wt 170 lb (77.1 kg)   SpO2 99% Comment: RA  BMI 23.06 kg/m  He looks well. Lung exam is clear. Cardiac exam shows a regular rate and rhythm with normal heart sounds. Chest incision is healing well and sternum is stable. The leg incisions are healing well and there is no peripheral edema.    Diagnostic Tests:  CLINICAL DATA:  Status post CABG on July 27th. The patient reports fatigue but no other complaints.  EXAM: CHEST  2 VIEW  COMPARISON:  PA and lateral chest x-ray of June 28, 2016  FINDINGS: The lungs are well-expanded. There is no infiltrate or atelectasis. There is no pneumothorax. Small bilateral pleural effusions have nearly totally cleared with only minimal blunting of the posterior costophrenic angles observed. The heart and pulmonary vascularity are normal. The mediastinum is normal in width. The retrosternal soft tissues are normal. The sternal wires are intact. The bony thorax exhibits no acute abnormality.  IMPRESSION: Mild hyperinflation which may be voluntary or remote may reflect chronic bronchitis. No CHF nor pneumonia. Near total resolution of bilateral pleural effusions.   Electronically Signed   By: David  Martinique M.D.   On: 07/29/2016 11:48   Impression:  Overall I think he is doing well. I encouraged him to continue walking. He is planning to participate in cardiac rehab. I told him he could drive his car but should not lift anything heavier than 10 lbs for three months postop. I told him that it may take 3 months for his stamina to return to his preop level.   Plan:  He will continue to follow up with cardiology and will contact me if he has any problems with his incisions.   Gaye Pollack, MD [facility] [phone]

## 2016-08-06 ENCOUNTER — Ambulatory Visit (HOSPITAL_COMMUNITY): Payer: 59

## 2016-08-10 ENCOUNTER — Encounter (HOSPITAL_COMMUNITY): Payer: 59

## 2016-08-12 ENCOUNTER — Encounter (HOSPITAL_COMMUNITY): Payer: 59

## 2016-08-14 ENCOUNTER — Encounter (HOSPITAL_COMMUNITY): Payer: 59

## 2016-08-17 ENCOUNTER — Encounter (HOSPITAL_COMMUNITY): Payer: 59

## 2016-08-18 ENCOUNTER — Telehealth: Payer: Self-pay | Admitting: Cardiovascular Disease

## 2016-08-18 ENCOUNTER — Encounter (HOSPITAL_COMMUNITY)
Admission: RE | Admit: 2016-08-18 | Discharge: 2016-08-18 | Disposition: A | Discharge status: Home / Self Care | Payer: 59 | Source: Ambulatory Visit | Attending: Cardiovascular Disease | Admitting: Cardiovascular Disease

## 2016-08-18 ENCOUNTER — Encounter: Payer: Self-pay | Admitting: Cardiovascular Disease

## 2016-08-18 VITALS — BP 104/60 | HR 80 | Ht 72.75 in | Wt 170.0 lb

## 2016-08-18 DIAGNOSIS — I213 ST elevation (STEMI) myocardial infarction of unspecified site: Secondary | ICD-10-CM | POA: Insufficient documentation

## 2016-08-18 DIAGNOSIS — Z951 Presence of aortocoronary bypass graft: Secondary | ICD-10-CM | POA: Insufficient documentation

## 2016-08-18 DIAGNOSIS — Z79899 Other long term (current) drug therapy: Secondary | ICD-10-CM | POA: Insufficient documentation

## 2016-08-18 DIAGNOSIS — Z7982 Long term (current) use of aspirin: Secondary | ICD-10-CM | POA: Diagnosis not present

## 2016-08-18 NOTE — Telephone Encounter (Signed)
I'm not concerned about his rhythm strips. He can start rehabilitation on Monday

## 2016-08-18 NOTE — Telephone Encounter (Addendum)
Returned call to [facility]-reports patient did walk test today and his EKG shows inverted T waves more pronounced than last OV on Aug 05, 2023.  Verdis Frederickson would like Dr. Gwenlyn Found to review strips and make sure it is okay for him to start rehab on Monday.  She is faxing strips over now.     Fax received-Primary RN Anderson Malta aware and will have MD review.

## 2016-08-18 NOTE — Progress Notes (Signed)
Cardiac Individual Treatment Plan  Patient Details  Name: Benjamin Robbins MRN: [id number] Date of Birth: 07/29/1956 Referring Provider:   Flowsheet Row CARDIAC REHAB PHASE II ORIENTATION from 08/18/2016 in [facility]  Referring Provider  Quay Burow, MD      Initial Encounter Date:  Flowsheet Row CARDIAC REHAB PHASE II ORIENTATION from 08/18/2016 in [facility]  Date  08/18/16  Referring Provider  Quay Burow, MD      Visit Diagnosis: S/P CABG x 4  ST elevation myocardial infarction (STEMI), unspecified artery ([place])  Patient's Home Medications on Admission:  Current Outpatient Prescriptions:  .  aspirin 81 MG tablet, Take 81 mg by mouth daily., Disp: , Rfl:  .  atorvastatin (LIPITOR) 80 MG tablet, Take 1 tablet (80 mg total) by mouth daily at 6 PM., Disp: 30 tablet, Rfl: 3 .  carvedilol (COREG) 12.5 MG tablet, Take 1 tablet (12.5 mg total) by mouth 2 (two) times daily., Disp: 180 tablet, Rfl: 3 .  clopidogrel (PLAVIX) 75 MG tablet, Take 1 tablet (75 mg total) by mouth daily., Disp: 30 tablet, Rfl: 3 .  esomeprazole (NEXIUM) 40 MG capsule, Take 40 mg by mouth daily as needed (reflux)., Disp: , Rfl:  .  lisinopril (PRINIVIL,ZESTRIL) 5 MG tablet, Take 1 tablet (5 mg total) by mouth daily., Disp: 90 tablet, Rfl: 3 .  Multiple Vitamin (MULTIVITAMINS PO), Take 1 tablet by mouth daily., Disp: , Rfl:  .  topiramate (TOPAMAX) 100 MG tablet, Take 100 mg by mouth at bedtime., Disp: , Rfl:  .  VITAMIN D, ERGOCALCIFEROL, PO, Take 5,000 Units by mouth daily., Disp: , Rfl:   Past Medical History: Past Medical History:  Diagnosis Date  . Cancer - prostate   . GERD (gastroesophageal reflux disease)   . Migraine     Tobacco Use: History  Smoking Status  . Never Smoker  Smokeless Tobacco  . Never Used    Labs: Recent Review Flowsheet Data    Labs for ITP Cardiac and Pulmonary Rehab Latest Ref Rng & Units 06/26/2016  06/26/2016 06/26/2016 06/26/2016 06/26/2016   Cholestrol 0 - 200 mg/dL - - - - -   LDLCALC 0 - 99 mg/dL - - - - -   HDL >40 mg/dL - - - - -   Trlycerides <150 mg/dL - - - - -   Hemoglobin A1c 4.8 - 5.6 % 5.7(H) - - - -   PHART 7.350 - 7.450 - - 7.350 7.323(L) -   PCO2ART 35.0 - 45.0 mmHg - - 34.7(L) 35.9 -   HCO3 20.0 - 24.0 mEq/L - - 19.0(L) 18.6(L) -   TCO2 0 - 100 mmol/L - 21 20 20 21    ACIDBASEDEF 0.0 - 2.0 mmol/L - - 6.0(H) 7.0(H) -   O2SAT % - - 99.0 98.0 -      Capillary Blood Glucose: Lab Results  Component Value Date   GLUCAP 95 06/29/2016   GLUCAP 104 (H) 06/28/2016   GLUCAP 122 (H) 06/28/2016   GLUCAP 114 (H) 06/28/2016   GLUCAP 105 (H) 06/28/2016     Exercise Target Goals: Date: 08/18/16  Exercise Program Goal: Individual exercise prescription set with THRR, safety & activity barriers. Participant demonstrates ability to understand and report RPE using BORG scale, to self-measure pulse accurately, and to acknowledge the importance of the exercise prescription.  Exercise Prescription Goal: Starting with aerobic activity 30 plus minutes a day, 3 days per week for initial exercise prescription.  Provide home exercise prescription and guidelines that participant acknowledges understanding prior to discharge.  Activity Barriers & Risk Stratification:     Activity Barriers & Cardiac Risk Stratification - 08/18/16 1406      Activity Barriers & Cardiac Risk Stratification   Activity Barriers None   Cardiac Risk Stratification High      6 Minute Walk:     6 Minute Walk    Row Name 08/18/16 1420 08/18/16 1629       6 Minute Walk   Phase  - Initial    Distance  - 1421 feet    Walk Time 6 minutes 6 minutes    MPH  - 2.69    METS  - 3.88    RPE 11 11    VO2 Peak  - 13.59    Symptoms  - No    Resting HR 80 bpm  -    Resting BP 104/60  -    Max Ex. HR 89 bpm  -    Max Ex. BP 124/70  -    2 Minute Post BP 124/70  -       Initial Exercise Prescription:      Initial Exercise Prescription - 08/18/16 1600      Date of Initial Exercise RX and Referring Provider   Date 08/18/16   Referring Provider Quay Burow, MD     Treadmill   MPH 2.5   Grade 1   Minutes 10   METs 3.26     Bike   Level 1.2   Minutes 10   METs 3.93     NuStep   Level 3   Minutes 10   METs 2.5     Prescription Details   Frequency (times per week) 3   Duration Progress to 30 minutes of continuous aerobic without signs/symptoms of physical distress     Intensity   THRR 40-80% of Max Heartrate 64-128   Ratings of Perceived Exertion 11-13   Perceived Dyspnea 0-4     Progression   Progression Continue to progress workloads to maintain intensity without signs/symptoms of physical distress.     Resistance Training   Training Prescription Yes   Weight 2lbs   Reps 10-12      Perform Capillary Blood Glucose checks as needed.  Exercise Prescription Changes:   Exercise Comments:   Discharge Exercise Prescription (Final Exercise Prescription Changes):   Nutrition:  Target Goals: Understanding of nutrition guidelines, daily intake of sodium 1500mg , cholesterol 200mg , calories 30% from fat and 7% or less from saturated fats, daily to have 5 or more servings of fruits and vegetables.  Biometrics:     Pre Biometrics - 08/18/16 1640      Pre Biometrics   Height 6' 0.75" (1.848 m)   Weight 169 lb 15.6 oz (77.1 kg)   Waist Circumference 34 inches   Hip Circumference 38 inches   Waist to Hip Ratio 0.89 %   BMI (Calculated) 22.6   Triceps Skinfold 13 mm   % Body Fat 21.7 %   Grip Strength 37.5 kg   Flexibility 9 in   Single Leg Stand 30 seconds       Nutrition Therapy Plan and Nutrition Goals:   Nutrition Discharge: Nutrition Scores:   Nutrition Goals Re-Evaluation:   Psychosocial: Target Goals: Acknowledge presence or absence of depression, maximize coping skills, provide positive support system. Participant is able to verbalize types  and ability to use techniques and skills needed for reducing stress and  depression.  Initial Review & Psychosocial Screening:     Initial Psych Review & Screening - 08/18/16 [place]? Yes     Barriers   Psychosocial barriers to participate in program There are no identifiable barriers or psychosocial needs.     Screening Interventions   Interventions Encouraged to exercise  no psychosocial interventions necessary       Quality of Life Scores:     Quality of Life - 08/18/16 1555      Quality of Life Scores   Health/Function Pre 17.87 %   Socioeconomic Pre 17.71 %   Psych/Spiritual Pre 16.93 %   Family Pre 24.3 %   GLOBAL Pre 18.59 %      PHQ-9: Recent Review Flowsheet Data    There is no flowsheet data to display.      Psychosocial Evaluation and Intervention:   Psychosocial Re-Evaluation:   Vocational Rehabilitation: Provide vocational rehab assistance to qualifying candidates.   Vocational Rehab Evaluation & Intervention:     Vocational Rehab - 08/18/16 1606      Initial Vocational Rehab Evaluation & Intervention   Assessment shows need for Vocational Rehabilitation No      Education: Education Goals: Education classes will be provided on a weekly basis, covering required topics. Participant will state understanding/return demonstration of topics presented.  Learning Barriers/Preferences:     Learning Barriers/Preferences - 08/18/16 1605      Learning Barriers/Preferences   Learning Barriers Sight  glasses    Learning Preferences Written Material;Verbal Instruction      Education Topics: Count Your Pulse:  -Group instruction provided by verbal instruction, demonstration, patient participation and written materials to support subject.  Instructors address importance of being able to find your pulse and how to count your pulse when at home without a heart monitor.  Patients get hands on experience  counting their pulse with staff help and individually.   Heart Attack, Angina, and Risk Factor Modification:  -Group instruction provided by verbal instruction, video, and written materials to support subject.  Instructors address signs and symptoms of angina and heart attacks.    Also discuss risk factors for heart disease and how to make changes to improve heart health risk factors.   Functional Fitness:  -Group instruction provided by verbal instruction, demonstration, patient participation, and written materials to support subject.  Instructors address safety measures for doing things around the house.  Discuss how to get up and down off the floor, how to pick things up properly, how to safely get out of a chair without assistance, and balance training.   Meditation and Mindfulness:  -Group instruction provided by verbal instruction, patient participation, and written materials to support subject.  Instructor addresses importance of mindfulness and meditation practice to help reduce stress and improve awareness.  Instructor also leads participants through a meditation exercise.    Stretching for Flexibility and Mobility:  -Group instruction provided by verbal instruction, patient participation, and written materials to support subject.  Instructors lead participants through series of stretches that are designed to increase flexibility thus improving mobility.  These stretches are additional exercise for major muscle groups that are typically performed during regular warm up and cool down.   Hands Only CPR Anytime:  -Group instruction provided by verbal instruction, video, patient participation and written materials to support subject.  Instructors co-teach with AHA video for hands only CPR.  Participants get hands on experience with mannequins.  Nutrition I class: Heart Healthy Eating:  -Group instruction provided by PowerPoint slides, verbal discussion, and written materials to support  subject matter. The instructor gives an explanation and review of the Therapeutic Lifestyle Changes diet recommendations, which includes a discussion on lipid goals, dietary fat, sodium, fiber, plant stanol/sterol esters, sugar, and the components of a well-balanced, healthy diet.   Nutrition II class: Lifestyle Skills:  -Group instruction provided by PowerPoint slides, verbal discussion, and written materials to support subject matter. The instructor gives an explanation and review of label reading, grocery shopping for heart health, heart healthy recipe modifications, and ways to make healthier choices when eating out.   Diabetes Question & Answer:  -Group instruction provided by PowerPoint slides, verbal discussion, and written materials to support subject matter. The instructor gives an explanation and review of diabetes co-morbidities, pre- and post-prandial blood glucose goals, pre-exercise blood glucose goals, signs, symptoms, and treatment of hypoglycemia and hyperglycemia, and foot care basics.   Diabetes Blitz:  -Group instruction provided by PowerPoint slides, verbal discussion, and written materials to support subject matter. The instructor gives an explanation and review of the physiology behind type 1 and type 2 diabetes, diabetes medications and rational behind using different medications, pre- and post-prandial blood glucose recommendations and Hemoglobin A1c goals, diabetes diet, and exercise including blood glucose guidelines for exercising safely.    Portion Distortion:  -Group instruction provided by PowerPoint slides, verbal discussion, written materials, and food models to support subject matter. The instructor gives an explanation of serving size versus portion size, changes in portions sizes over the last 20 years, and what consists of a serving from each food group.   Stress Management:  -Group instruction provided by verbal instruction, video, and written materials to  support subject matter.  Instructors review role of stress in heart disease and how to cope with stress positively.     Exercising on Your Own:  -Group instruction provided by verbal instruction, power point, and written materials to support subject.  Instructors discuss benefits of exercise, components of exercise, frequency and intensity of exercise, and end points for exercise.  Also discuss use of nitroglycerin and activating EMS.  Review options of places to exercise outside of rehab.  Review guidelines for sex with heart disease.   Cardiac Drugs I:  -Group instruction provided by verbal instruction and written materials to support subject.  Instructor reviews cardiac drug classes: antiplatelets, anticoagulants, beta blockers, and statins.  Instructor discusses reasons, side effects, and lifestyle considerations for each drug class.   Cardiac Drugs II:  -Group instruction provided by verbal instruction and written materials to support subject.  Instructor reviews cardiac drug classes: angiotensin converting enzyme inhibitors (ACE-I), angiotensin II receptor blockers (ARBs), nitrates, and calcium channel blockers.  Instructor discusses reasons, side effects, and lifestyle considerations for each drug class.   Anatomy and Physiology of the Circulatory System:  -Group instruction provided by verbal instruction, video, and written materials to support subject.  Reviews functional anatomy of heart, how it relates to various diagnoses, and what role the heart plays in the overall system.   Knowledge Questionnaire Score:     Knowledge Questionnaire Score - 08/18/16 1606      Knowledge Questionnaire Score   Pre Score 23/24      Core Components/Risk Factors/Patient Goals at Admission:     Personal Goals and Risk Factors at Admission - 08/18/16 1507      Core Components/Risk Factors/Patient Goals on Admission   Intervention Provide education on lifestyle  modifcations including regular  physical activity/exercise, weight management, moderate sodium restriction and increased consumption of fresh fruit, vegetables, and low fat dairy, alcohol moderation, and smoking cessation.;Monitor prescription use compliance.   Expected Outcomes Short Term: Continued assessment and intervention until BP is < 140/52mm HG in hypertensive participants. < 130/62mm HG in hypertensive participants with diabetes, heart failure or chronic kidney disease.;Long Term: Maintenance of blood pressure at goal levels.   Lipids Yes   Intervention Provide education and support for participant on nutrition & aerobic/resistive exercise along with prescribed medications to achieve LDL 70mg , HDL >40mg .   Expected Outcomes Short Term: Participant states understanding of desired cholesterol values and is compliant with medications prescribed. Participant is following exercise prescription and nutrition guidelines.;Long Term: Cholesterol controlled with medications as prescribed, with individualized exercise RX and with personalized nutrition plan. Value goals: LDL < 70mg , HDL > 40 mg.   Stress Yes   Intervention Offer individual and/or small group education and counseling on adjustment to heart disease, stress management and health-related lifestyle change. Teach and support self-help strategies.;Refer participants experiencing significant psychosocial distress to appropriate mental health specialists for further evaluation and treatment. When possible, include family members and significant others in education/counseling sessions.   Expected Outcomes Short Term: Participant demonstrates changes in health-related behavior, relaxation and other stress management skills, ability to obtain effective social support, and compliance with psychotropic medications if prescribed.;Long Term: Emotional wellbeing is indicated by absence of clinically significant psychosocial distress or social isolation.   Personal Goal Other Yes    Personal Goal Increase energy level. Feel better.   Intervention Develop an individualized exercise prescription for aerobic and resistive training based on initial evaluation findings, risk stratification, comorbidities and participant's personal goals.   Expected Outcomes Achievement of increased cardiorespiraory fitness and enhanced flexibility, muscular endurance and strength shown through meazurements of functional capacity and personal statement of participant.      Core Components/Risk Factors/Patient Goals Review:    Core Components/Risk Factors/Patient Goals at Discharge (Final Review):    ITP Comments:     ITP Comments    Row Name 08/18/16 1336           ITP Comments Medical Director- Dr. Fransico Him, MD          Comments: Patient attended orientation from 1330 to 1530  to review rules and guidelines for program. Completed 6 minute walk test, Intitial ITP, and exercise prescription.  VSS. Telemetry sinus rhythm with T wave inversion, no change from previous 12 lead EKG.   pt asymptomatic.   Dr. Gwenlyn Found notified.

## 2016-08-18 NOTE — Progress Notes (Signed)
Cardiac Rehab Medication Review by a Pharmacist  Does the patient  feel that his/her medications are working for him/her?  yes  Has the patient been experiencing any side effects to the medications prescribed?  yes  Does the patient measure his/her own blood pressure or blood glucose at home?  no   Does the patient have any problems obtaining medications due to transportation or finances?   no  Understanding of regimen: good Understanding of indications: good Potential of compliance: good    Pharmacist comments: Patient presents with his wife. Most of his medications are new for him; was only on topiramate before his MI. He understands what his medications are for and endorses good compliance. Recommended monitoring his blood pressure at home. Patient's wife asked if Plavix was a blood thinner- explained that it's an antiplatelet drug but can also increase risk of bleeding. Patient and wife verbalized understanding.    Benjamin Robbins 08/18/2016 2:01 PM

## 2016-08-18 NOTE — Telephone Encounter (Signed)
New message      Calling to let Dr Gwenlyn Found know that she is faxing strips over for him to review.  Pt has some inverted Twaves. He starts exercise on Monday and just want to make sure he is ok to start monday

## 2016-08-18 NOTE — Telephone Encounter (Signed)
Encounter routed to Rehab nurse, [name], via [facility] and faxed via EPIC to [phone].

## 2016-08-19 ENCOUNTER — Encounter (HOSPITAL_COMMUNITY): Payer: 59

## 2016-08-21 ENCOUNTER — Encounter (HOSPITAL_COMMUNITY): Payer: 59

## 2016-08-24 ENCOUNTER — Encounter (HOSPITAL_COMMUNITY)
Admission: RE | Admit: 2016-08-24 | Discharge: 2016-08-24 | Disposition: A | Discharge status: Home / Self Care | Payer: 59 | Source: Ambulatory Visit | Attending: Cardiovascular Disease | Admitting: Cardiovascular Disease

## 2016-08-24 ENCOUNTER — Encounter (HOSPITAL_COMMUNITY): Payer: 59

## 2016-08-24 DIAGNOSIS — I213 ST elevation (STEMI) myocardial infarction of unspecified site: Secondary | ICD-10-CM

## 2016-08-24 DIAGNOSIS — Z951 Presence of aortocoronary bypass graft: Secondary | ICD-10-CM | POA: Diagnosis not present

## 2016-08-24 DIAGNOSIS — Z79899 Other long term (current) drug therapy: Secondary | ICD-10-CM | POA: Diagnosis not present

## 2016-08-24 DIAGNOSIS — Z7982 Long term (current) use of aspirin: Secondary | ICD-10-CM | POA: Diagnosis not present

## 2016-08-24 NOTE — Progress Notes (Signed)
Daily Session Note  Patient Details  Name: Benjamin Robbins MRN: [id number] Date of Birth: 13-Oct-1956 Referring Provider:   Flowsheet Row CARDIAC REHAB PHASE II ORIENTATION from 08/18/2016 in [facility]  Referring Provider  Quay Burow, MD      Encounter Date: 08/24/2016  Check In:     Session Check In - 08/24/16 1027      Check-In   Location [facility]   Staff Present Luetta Nutting Fair, MS, ACSM RCEP, Exercise Physiologist;Joann Rion, RN, Deland Pretty, MS, ACSM CEP, Exercise Physiologist;Damilola Flamm, RN, BSN   Supervising physician immediately available to respond to emergencies [facility] immediately available   Physician(s) Dr. Dyann Kief    Medication changes reported     No   Fall or balance concerns reported    No   Warm-up and Cool-down Performed as group-led instruction   Resistance Training Performed Yes   VAD Patient? No     Pain Assessment   Currently in Pain? No/denies      Capillary Blood Glucose: No results found for this or any previous visit (from the past 24 hour(s)).   Goals Met:  Exercise tolerated well  Goals Unmet:  Not Applicable  Comments: Pt started cardiac rehab today.  Pt tolerated light exercise without difficulty. VSS, telemetry-Sinus Rhythm with PVC's, asymptomatic.  Medication list reconciled. Pt denies barriers to medicaiton compliance.  PSYCHOSOCIAL ASSESSMENT:  PHQ-0. Pt exhibits positive coping skills, hopeful outlook with supportive family. No psychosocial needs identified at this time, no psychosocial interventions necessary.    Pt enjoys playing with his grandchildren.   Pt oriented to exercise equipment and routine.    Understanding verbalized.[name], RN,BSN 08/24/2016 5:55 PM   Dr. Fransico Him is Medical Director for Cardiac Rehab at [facility]

## 2016-08-25 ENCOUNTER — Telehealth: Payer: Self-pay | Admitting: Cardiovascular Disease

## 2016-08-25 DIAGNOSIS — H52223 Regular astigmatism, bilateral: Secondary | ICD-10-CM | POA: Diagnosis not present

## 2016-08-25 NOTE — Telephone Encounter (Signed)
Advice communicated to patient who voiced understanding. He notes also that he was supposed to hear back from medical records about a fax from [place] to have his disability claim extended. Aware I will route message for follow up.

## 2016-08-25 NOTE — Telephone Encounter (Signed)
That is correct, history of CVA is not contraindication to flu vaccine.

## 2016-08-25 NOTE — Telephone Encounter (Signed)
New message  Pt call requesting to speak with RN about getting a flu shot. Pt stated he just had a stroke and wants to know if it ok to get a flu shot. Please call back to discuss

## 2016-08-25 NOTE — Telephone Encounter (Signed)
I have reviewed CDC guidelines and there don't appear to be contraindications based on CVA. Will route for recommendations.

## 2016-08-26 ENCOUNTER — Encounter (HOSPITAL_COMMUNITY): Payer: 59

## 2016-08-26 ENCOUNTER — Encounter (HOSPITAL_COMMUNITY)
Admission: RE | Admit: 2016-08-26 | Discharge: 2016-08-26 | Disposition: A | Discharge status: Home / Self Care | Payer: 59 | Source: Ambulatory Visit | Attending: Cardiovascular Disease | Admitting: Cardiovascular Disease

## 2016-08-26 DIAGNOSIS — I213 ST elevation (STEMI) myocardial infarction of unspecified site: Secondary | ICD-10-CM | POA: Diagnosis not present

## 2016-08-26 DIAGNOSIS — Z7982 Long term (current) use of aspirin: Secondary | ICD-10-CM | POA: Diagnosis not present

## 2016-08-26 DIAGNOSIS — Z79899 Other long term (current) drug therapy: Secondary | ICD-10-CM | POA: Diagnosis not present

## 2016-08-26 DIAGNOSIS — Z951 Presence of aortocoronary bypass graft: Secondary | ICD-10-CM

## 2016-08-27 MED FILL — CLOPIDOGREL 75 MG TABLET: 75 | 30 days supply | Qty: 30 | Fill #2

## 2016-08-28 ENCOUNTER — Encounter (HOSPITAL_COMMUNITY): Payer: 59

## 2016-08-28 ENCOUNTER — Encounter (HOSPITAL_COMMUNITY)
Admission: RE | Admit: 2016-08-28 | Discharge: 2016-08-28 | Disposition: A | Discharge status: Home / Self Care | Payer: 59 | Source: Ambulatory Visit | Attending: Cardiovascular Disease | Admitting: Cardiovascular Disease

## 2016-08-28 DIAGNOSIS — Z79899 Other long term (current) drug therapy: Secondary | ICD-10-CM | POA: Diagnosis not present

## 2016-08-28 DIAGNOSIS — Z951 Presence of aortocoronary bypass graft: Secondary | ICD-10-CM | POA: Diagnosis not present

## 2016-08-28 DIAGNOSIS — Z7982 Long term (current) use of aspirin: Secondary | ICD-10-CM | POA: Diagnosis not present

## 2016-08-28 DIAGNOSIS — I213 ST elevation (STEMI) myocardial infarction of unspecified site: Secondary | ICD-10-CM

## 2016-08-28 MED FILL — TOPIRAMATE 100 MG TABLET: 100 | 90 days supply | Qty: 90 | Fill #1

## 2016-08-28 NOTE — Progress Notes (Signed)
Reviewed home exercise guidelines with patient including endpoints, temperature precautions, target heart rate and rate of perceived exertion. Pt is walking on the days he doesn't attend cardiac rehab as his mode of home exercise. Pt voices understanding of instructions given. Sol Passer, MS, ACSM CCEP

## 2016-08-31 ENCOUNTER — Encounter (HOSPITAL_COMMUNITY): Payer: 59

## 2016-08-31 ENCOUNTER — Encounter (HOSPITAL_COMMUNITY)
Admission: RE | Admit: 2016-08-31 | Discharge: 2016-08-31 | Disposition: A | Discharge status: Home / Self Care | Payer: 59 | Source: Ambulatory Visit | Attending: Cardiovascular Disease | Admitting: Cardiovascular Disease

## 2016-08-31 DIAGNOSIS — Z7982 Long term (current) use of aspirin: Secondary | ICD-10-CM | POA: Diagnosis not present

## 2016-08-31 DIAGNOSIS — Z79899 Other long term (current) drug therapy: Secondary | ICD-10-CM | POA: Insufficient documentation

## 2016-08-31 DIAGNOSIS — Z951 Presence of aortocoronary bypass graft: Secondary | ICD-10-CM | POA: Insufficient documentation

## 2016-08-31 DIAGNOSIS — I213 ST elevation (STEMI) myocardial infarction of unspecified site: Secondary | ICD-10-CM | POA: Diagnosis not present

## 2016-09-01 ENCOUNTER — Encounter: Payer: Self-pay | Admitting: Cardiovascular Disease

## 2016-09-01 ENCOUNTER — Ambulatory Visit (INDEPENDENT_AMBULATORY_CARE_PROVIDER_SITE_OTHER): Payer: 59 | Admitting: Cardiovascular Disease

## 2016-09-01 VITALS — BP 129/81 | HR 81 | Ht 72.0 in | Wt 171.0 lb

## 2016-09-01 DIAGNOSIS — E785 Hyperlipidemia, unspecified: Secondary | ICD-10-CM

## 2016-09-01 DIAGNOSIS — I519 Heart disease, unspecified: Secondary | ICD-10-CM | POA: Diagnosis not present

## 2016-09-01 DIAGNOSIS — Z79899 Other long term (current) drug therapy: Secondary | ICD-10-CM

## 2016-09-01 MED ORDER — CARVEDILOL 12.5 MG PO TABS: 18.7500 mg | ORAL_TABLET | Freq: Two times a day (BID) | ORAL | 3 refills | Status: DC

## 2016-09-01 MED FILL — CARVEDILOL 12.5 MG TABLET: 12.5 | 90 days supply | Qty: 270 | Fill #0

## 2016-09-01 NOTE — Assessment & Plan Note (Signed)
History of ischemic coronary myopathy in the setting of a recent anterior STEMI the ejection fraction in the 30th 35% range. He does complain of some fatigue. I'm going to titrate his carvedilol. I will check a 2-D echocardiogram next month to assess LV function.

## 2016-09-01 NOTE — Assessment & Plan Note (Signed)
History of hyperlipidemia now on high-dose atorvastatin with recent lipid profile performed 06/25/16. Total cholesterol 54, LDL 111, HDL 35. We'll recheck a lipid and liver profile

## 2016-09-01 NOTE — Assessment & Plan Note (Signed)
Benjamin Robbins suffered an anterior STEMI on 06/25/16 was taken urgently to the cath lab where I demonstrated left main/three-vessel disease with moderately severe LV dysfunction. He urgently underwent coronary artery bypass grafting X 4 by Dr. Caffie Pinto with a LIMA to his LAD, vein to obtuse marginal branch and sequential vein to the PDA and PLA. He was discharged home on 06/30/16. Recently started cardiac rehabilitation. He is a symptomatic.

## 2016-09-01 NOTE — Progress Notes (Signed)
09/01/2016 Benjamin Robbins   1956/06/29  IP:[id number]  Primary Physician  Melinda Crutch, MD Primary Cardiologist: Lorretta Harp MD Renae Gloss  HPI:  Benjamin Robbins is a 60 year old thin-appearing married Caucasian male father of 20, grandfather 37 grandchildren was accompanied by his wife today. He has no  prior cardiac history or risk factors other than premature family history. He has had no prior chest pain until 5:30 the afternoon of 06/25/16. 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 He developed substernal chest pain and was brought urgently to the emergency room where an EKG showed anterior ST segment elevation with reciprocal inferior depression. He was treated with aspirin and IV nitroglycerin was brought emergently to the Cath Lab where I performed cardiac catheterization revealing left main/three-vessel disease with moderately severe LV dysfunction (EF 25-30%). He underwent emergent coronary artery bypass grafting X 4 by Dr. Cyndia Bent with a LIMA to the LAD, vein to obtuse marginal branch and sequential vein to the PDA and PLA. He was discharged home on 06/30/16. He has been doing well and recuperating nicely. He started cardiac rehabilitation.   Current Outpatient Prescriptions  Medication Sig Dispense Refill  . aspirin 81 MG tablet Take 81 mg by mouth daily.    [name] atorvastatin (LIPITOR) 80 MG tablet Take 1 tablet (80 mg total) by mouth daily at 6 PM. 30 tablet 3  . esomeprazole (NEXIUM) 40 MG capsule Take 40 mg by mouth daily as needed (reflux).    [name] lisinopril (PRINIVIL,ZESTRIL) 5 MG tablet Take 1 tablet (5 mg total) by mouth daily. 90 tablet 3  . Multiple Vitamin (MULTIVITAMINS PO) Take 1 tablet by mouth daily.    [name] topiramate (TOPAMAX) 100 MG tablet Take 100 mg by mouth at bedtime.    [name] VITAMIN D, ERGOCALCIFEROL, PO Take 5,000 Units by mouth daily.     No current facility-administered medications for this visit.     No Known Allergies  Social History    Social History  . Marital status: Married    Spouse name: [name]  . Number of children: 3  . Years of education: [name]   Occupational History  . Supply in [place] History Main Topics  . Smoking status: Never Smoker  . Smokeless tobacco: Never Used  . Alcohol use No  . Drug use: No  . Sexual activity: Not on file   Other Topics Concern  . Not on file   Social History Narrative   Daily caffeine     Review of Systems: General: negative for chills, fever, night sweats or weight changes.  Cardiovascular: negative for chest pain, dyspnea on exertion, edema, orthopnea, palpitations, paroxysmal nocturnal dyspnea or shortness of breath Dermatological: negative for rash Respiratory: negative for cough or wheezing Urologic: negative for hematuria Abdominal: negative for nausea, vomiting, diarrhea, bright red blood per rectum, melena, or hematemesis Neurologic: negative for visual changes, syncope, or dizziness All other systems reviewed and are otherwise negative except as noted above.    Blood pressure 129/81, pulse 81, height 6' (1.829 m), weight 171 lb (77.6 kg), SpO2 100 %.  General appearance: alert and no distress Neck: no adenopathy, no carotid bruit, no JVD, supple, symmetrical, trachea midline and thyroid not enlarged, symmetric, no tenderness/mass/nodules Lungs: clear to auscultation bilaterally Heart: regular rate and rhythm, S1, S2 normal, no murmur, click, rub or gallop Extremities: extremities normal, atraumatic, no cyanosis or edema  EKG not  performed today  ASSESSMENT  AND PLAN:   STEMI of anterior wall, 06/25/16 [name] suffered an anterior STEMI on 06/25/16 was taken urgently to the cath lab where I demonstrated left main/three-vessel disease with moderately severe LV dysfunction. He urgently underwent coronary artery bypass grafting X 4 by Dr. Caffie Pinto with a LIMA to his LAD, vein to obtuse marginal branch and sequential vein to the PDA and PLA.  He was discharged home on 06/30/16. Recently started cardiac rehabilitation. He is a symptomatic.  Cardiomyopathy, ischemic History of ischemic coronary myopathy in the setting of a recent anterior STEMI the ejection fraction in the 30th 35% range. He does complain of some fatigue. I'm going to titrate his carvedilol. I will check a 2-D echocardiogram next month to assess LV function.  Dyslipidemia History of hyperlipidemia now on high-dose atorvastatin with recent lipid profile performed 06/25/16. Total cholesterol 54, LDL 111, HDL 35. We'll recheck a lipid and liver profile      Lorretta Harp MD [facility], [facility] 09/01/2016 7:51 AM

## 2016-09-01 NOTE — Patient Instructions (Signed)
Medication Instructions:  Your physician has recommended you make the following change in your medication:  1- STOP TAKING YOUR Clopidogrel. 2- INCREASE Carvedilol to 18.75 mg (1.5 tablet) by mouth twice a day.   Labwork: Your physician recommends that you return for lab work in: [place]. You will need to be fasting.   Testing/Procedures: Your physician has requested that you have an echocardiogram. Echocardiography is a painless test that uses sound waves to create images of your heart. It provides your doctor with information about the size and shape of your heart and how well your heart's chambers and valves are working. This procedure takes approximately one hour. There are no restrictions for this procedure.    Follow-Up: Your physician wants you to follow-up in: [place]. You will receive a reminder letter in the mail two months in advance. If you don't receive a letter, please call our office to schedule the follow-up appointment.   If you need a refill on your cardiac medications before your next appointment, please call your pharmacy.

## 2016-09-02 ENCOUNTER — Encounter (HOSPITAL_COMMUNITY): Payer: 59

## 2016-09-02 ENCOUNTER — Encounter (HOSPITAL_COMMUNITY)
Admission: RE | Admit: 2016-09-02 | Discharge: 2016-09-02 | Disposition: A | Discharge status: Home / Self Care | Payer: 59 | Source: Ambulatory Visit | Attending: Cardiovascular Disease | Admitting: Cardiovascular Disease

## 2016-09-02 DIAGNOSIS — Z7982 Long term (current) use of aspirin: Secondary | ICD-10-CM | POA: Diagnosis not present

## 2016-09-02 DIAGNOSIS — Z79899 Other long term (current) drug therapy: Secondary | ICD-10-CM | POA: Diagnosis not present

## 2016-09-02 DIAGNOSIS — Z951 Presence of aortocoronary bypass graft: Secondary | ICD-10-CM

## 2016-09-02 DIAGNOSIS — I213 ST elevation (STEMI) myocardial infarction of unspecified site: Secondary | ICD-10-CM | POA: Diagnosis not present

## 2016-09-02 LAB — HEPATIC FUNCTION PANEL
ALT: 20 U/L (ref 9–46)
AST: 19 U/L (ref 10–35)
Albumin: 4.2 g/dL (ref 3.6–5.1)
Alkaline Phosphatase: 85 U/L (ref 40–115)
Bilirubin, Direct: 0.1 mg/dL (ref <=0.2)
Indirect Bilirubin: 0.3 mg/dL (ref 0.2–1.2)
Total Bilirubin: 0.4 mg/dL (ref 0.2–1.2)
Total Protein: 6.7 g/dL (ref 6.1–8.1)

## 2016-09-02 LAB — LIPID PANEL
Cholesterol: 93 mg/dL — ABNORMAL LOW (ref 125–200)
HDL: 27 mg/dL — ABNORMAL LOW (ref >=40)
LDL Cholesterol: 53 mg/dL (ref <130)
Total CHOL/HDL Ratio: 3.4 Ratio (ref <=5.0)
Triglycerides: 64 mg/dL (ref <150)
VLDL: 13 mg/dL (ref <30)

## 2016-09-04 ENCOUNTER — Encounter (HOSPITAL_COMMUNITY): Payer: 59

## 2016-09-04 ENCOUNTER — Encounter (HOSPITAL_COMMUNITY)
Admission: RE | Admit: 2016-09-04 | Discharge: 2016-09-04 | Disposition: A | Discharge status: Home / Self Care | Payer: 59 | Source: Ambulatory Visit | Attending: Cardiovascular Disease | Admitting: Cardiovascular Disease

## 2016-09-04 DIAGNOSIS — Z79899 Other long term (current) drug therapy: Secondary | ICD-10-CM | POA: Diagnosis not present

## 2016-09-04 DIAGNOSIS — I213 ST elevation (STEMI) myocardial infarction of unspecified site: Secondary | ICD-10-CM | POA: Diagnosis not present

## 2016-09-04 DIAGNOSIS — Z951 Presence of aortocoronary bypass graft: Secondary | ICD-10-CM | POA: Diagnosis not present

## 2016-09-04 DIAGNOSIS — Z7982 Long term (current) use of aspirin: Secondary | ICD-10-CM | POA: Diagnosis not present

## 2016-09-07 ENCOUNTER — Encounter (HOSPITAL_COMMUNITY): Payer: 59

## 2016-09-07 ENCOUNTER — Encounter (HOSPITAL_COMMUNITY)
Admission: RE | Admit: 2016-09-07 | Discharge: 2016-09-07 | Disposition: A | Discharge status: Home / Self Care | Payer: 59 | Source: Ambulatory Visit | Attending: Cardiovascular Disease | Admitting: Cardiovascular Disease

## 2016-09-07 DIAGNOSIS — Z79899 Other long term (current) drug therapy: Secondary | ICD-10-CM | POA: Diagnosis not present

## 2016-09-07 DIAGNOSIS — I213 ST elevation (STEMI) myocardial infarction of unspecified site: Secondary | ICD-10-CM | POA: Diagnosis not present

## 2016-09-07 DIAGNOSIS — Z951 Presence of aortocoronary bypass graft: Secondary | ICD-10-CM

## 2016-09-07 DIAGNOSIS — Z7982 Long term (current) use of aspirin: Secondary | ICD-10-CM | POA: Diagnosis not present

## 2016-09-07 NOTE — Progress Notes (Signed)
Cardiac Individual Treatment Plan  Patient Details  Name: Benjamin Robbins MRN: [id number] Date of Birth: 03/13/1956 Referring Provider:   Flowsheet Row CARDIAC REHAB PHASE II ORIENTATION from 08/18/2016 in [facility]  Referring Provider  Quay Burow, MD      Initial Encounter Date:  Flowsheet Row CARDIAC REHAB PHASE II ORIENTATION from 08/18/2016 in [facility]  Date  08/18/16  Referring Provider  Quay Burow, MD      Visit Diagnosis: S/P CABG x 4  ST elevation myocardial infarction (STEMI), unspecified artery ([place])  Patient's Home Medications on Admission:  Current Outpatient Prescriptions:  .  aspirin 81 MG tablet, Take 81 mg by mouth daily., Disp: , Rfl:  .  atorvastatin (LIPITOR) 80 MG tablet, Take 1 tablet (80 mg total) by mouth daily at 6 PM., Disp: 30 tablet, Rfl: 3 .  carvedilol (COREG) 12.5 MG tablet, Take 1.5 tablets (18.75 mg total) by mouth 2 (two) times daily., Disp: 270 tablet, Rfl: 3 .  esomeprazole (NEXIUM) 40 MG capsule, Take 40 mg by mouth daily as needed (reflux)., Disp: , Rfl:  .  lisinopril (PRINIVIL,ZESTRIL) 5 MG tablet, Take 1 tablet (5 mg total) by mouth daily., Disp: 90 tablet, Rfl: 3 .  Multiple Vitamin (MULTIVITAMINS PO), Take 1 tablet by mouth daily., Disp: , Rfl:  .  topiramate (TOPAMAX) 100 MG tablet, Take 100 mg by mouth at bedtime., Disp: , Rfl:  .  VITAMIN D, ERGOCALCIFEROL, PO, Take 5,000 Units by mouth daily., Disp: , Rfl:   Past Medical History: Past Medical History:  Diagnosis Date  . Cancer - prostate   . GERD (gastroesophageal reflux disease)   . Migraine     Tobacco Use: History  Smoking Status  . Never Smoker  Smokeless Tobacco  . Never Used    Labs: Recent Review Flowsheet Data    Labs for ITP Cardiac and Pulmonary Rehab Latest Ref Rng & Units 06/26/2016 06/26/2016 06/26/2016 06/26/2016 09/01/2016   Cholestrol 125 - 200 mg/dL - - - - 93(L)   LDLCALC <130  mg/dL - - - - 53   HDL >=40 mg/dL - - - - 27(L)   Trlycerides <150 mg/dL - - - - 64   Hemoglobin A1c 4.8 - 5.6 % - - - - -   PHART 7.350 - 7.450 - 7.350 7.323(L) - -   PCO2ART 35.0 - 45.0 mmHg - 34.7(L) 35.9 - -   HCO3 20.0 - 24.0 mEq/L - 19.0(L) 18.6(L) - -   TCO2 0 - 100 mmol/L _0 -   ACIDBASEDEF 0.0 - 2.0 mmol/L - 6.0(H) 7.0(H) - -   O2SAT % - 99.0 98.0 - -      Capillary Blood Glucose: Lab Results  Component Value Date   GLUCAP 95 06/29/2016   GLUCAP 104 (H) 06/28/2016   GLUCAP 122 (H) 06/28/2016   GLUCAP 114 (H) 06/28/2016   GLUCAP 105 (H) 06/28/2016     Exercise Target Goals:    Exercise Program Goal: Individual exercise prescription set with THRR, safety & activity barriers. Participant demonstrates ability to understand and report RPE using BORG scale, to self-measure pulse accurately, and to acknowledge the importance of the exercise prescription.  Exercise Prescription Goal: Starting with aerobic activity 30 plus minutes a day, 3 days per week for initial exercise prescription. Provide home exercise prescription and guidelines that participant acknowledges understanding prior to discharge.  Activity Barriers & Risk Stratification:  Activity Barriers & Cardiac Risk Stratification - 08/18/16 1406      Activity Barriers & Cardiac Risk Stratification   Activity Barriers None   Cardiac Risk Stratification High      6 Minute Walk:     6 Minute Walk    Row Name 08/18/16 1420 08/18/16 1629       6 Minute Walk   Phase  - Initial    Distance  - 1421 feet    Walk Time 6 minutes 6 minutes    MPH  - 2.69    METS  - 3.88    RPE 11 11    VO2 Peak  - 13.59    Symptoms  - No    Resting HR 80 bpm  -    Resting BP 104/60  -    Max Ex. HR 89 bpm  -    Max Ex. BP 124/70  -    2 Minute Post BP 124/70  -       Initial Exercise Prescription:     Initial Exercise Prescription - 08/18/16 1600      Date of Initial Exercise RX and Referring Provider    Date 08/18/16   Referring Provider Quay Burow, MD     Treadmill   MPH 2.5   Grade 1   Minutes 10   METs 3.26     Bike   Level 1.2   Minutes 10   METs 3.93     NuStep   Level 3   Minutes 10   METs 2.5     Prescription Details   Frequency (times per week) 3   Duration Progress to 30 minutes of continuous aerobic without signs/symptoms of physical distress     Intensity   THRR 40-80% of Max Heartrate 64-128   Ratings of Perceived Exertion 11-13   Perceived Dyspnea 0-4     Progression   Progression Continue to progress workloads to maintain intensity without signs/symptoms of physical distress.     Resistance Training   Training Prescription Yes   Weight 2lbs   Reps 10-12      Perform Capillary Blood Glucose checks as needed.  Exercise Prescription Changes:      Exercise Prescription Changes    Row Name 09/09/16 1100             Exercise Review   Progression Yes         Response to Exercise   Blood Pressure (Admit) 130/64       Blood Pressure (Exercise) 142/72       Blood Pressure (Exit) 112/72       Heart Rate (Admit) 75 bpm       Heart Rate (Exercise) 130 bpm       Heart Rate (Exit) 75 bpm       Rating of Perceived Exertion (Exercise) 12       Comments Reviewed HEP on 08/28/16         Progression   Average METs 4.2         Resistance Training   Training Prescription Yes       Weight 4lbs       Reps 10-12         Treadmill   MPH 3       Grade 1       Minutes 10       METs 3.71         Bike   Level 1.2  Minutes 10       METs 3.93         NuStep   Level 3       Minutes 10       METs 4.9         Home Exercise Plan   Plans to continue exercise at Home  reviewed HEP on 08/28/16       Frequency Add 2 additional days to program exercise sessions.          Exercise Comments:   Discharge Exercise Prescription (Final Exercise Prescription Changes):     Exercise Prescription Changes - 09/09/16 1100      Exercise  Review   Progression Yes     Response to Exercise   Blood Pressure (Admit) 130/64   Blood Pressure (Exercise) 142/72   Blood Pressure (Exit) 112/72   Heart Rate (Admit) 75 bpm   Heart Rate (Exercise) 130 bpm   Heart Rate (Exit) 75 bpm   Rating of Perceived Exertion (Exercise) 12   Comments Reviewed HEP on 08/28/16     Progression   Average METs 4.2     Resistance Training   Training Prescription Yes   Weight 4lbs   Reps 10-12     Treadmill   MPH 3   Grade 1   Minutes 10   METs 3.71     Bike   Level 1.2   Minutes 10   METs 3.93     NuStep   Level 3   Minutes 10   METs 4.9     Home Exercise Plan   Plans to continue exercise at Home  reviewed HEP on 08/28/16   Frequency Add 2 additional days to program exercise sessions.      Nutrition:  Target Goals: Understanding of nutrition guidelines, daily intake of sodium '1500mg'$ , cholesterol '200mg'$ , calories 30% from fat and 7% or less from saturated fats, daily to have 5 or more servings of fruits and vegetables.  Biometrics:     Pre Biometrics - 08/18/16 1640      Pre Biometrics   Height 6' 0.75" (1.848 m)   Weight 169 lb 15.6 oz (77.1 kg)   Waist Circumference 34 inches   Hip Circumference 38 inches   Waist to Hip Ratio 0.89 %   BMI (Calculated) 22.6   Triceps Skinfold 13 mm   % Body Fat 21.7 %   Grip Strength 37.5 kg   Flexibility 9 in   Single Leg Stand 30 seconds       Nutrition Therapy Plan and Nutrition Goals:   Nutrition Discharge: Nutrition Scores:   Nutrition Goals Re-Evaluation:   Psychosocial: Target Goals: Acknowledge presence or absence of depression, maximize coping skills, provide positive support system. Participant is able to verbalize types and ability to use techniques and skills needed for reducing stress and depression.  Initial Review & Psychosocial Screening:     Initial Psych Review & Screening - 08/18/16 [place]? Yes      Barriers   Psychosocial barriers to participate in program There are no identifiable barriers or psychosocial needs.     Screening Interventions   Interventions Encouraged to exercise  no psychosocial interventions necessary       Quality of Life Scores:     Quality of Life - 08/18/16 1555      Quality of Life Scores   Health/Function Pre 17.87 %   Socioeconomic Pre 17.71 %  Psych/Spiritual Pre 16.93 %   Family Pre 24.3 %   GLOBAL Pre 18.59 %      PHQ-9: Recent Review Flowsheet Data    Depression screen [facility] 2/9 08/24/2016   Decreased Interest 0   Down, Depressed, Hopeless 0   PHQ - 2 Score 0      Psychosocial Evaluation and Intervention:     Psychosocial Evaluation - 09/07/16 1648      Psychosocial Evaluation & Interventions   Interventions Encouraged to exercise with the program and follow exercise prescription   Comments no psychosocial neds idenified, no interventions necessary, pt states he feels the program is helping him regain strength and stamina.  he is planning to return to work in 2 weeks.    Continued Psychosocial Services Needed No      Psychosocial Re-Evaluation:   Vocational Rehabilitation: Provide vocational rehab assistance to qualifying candidates.   Vocational Rehab Evaluation & Intervention:     Vocational Rehab - 08/18/16 1606      Initial Vocational Rehab Evaluation & Intervention   Assessment shows need for Vocational Rehabilitation No      Education: Education Goals: Education classes will be provided on a weekly basis, covering required topics. Participant will state understanding/return demonstration of topics presented.  Learning Barriers/Preferences:     Learning Barriers/Preferences - 08/18/16 1605      Learning Barriers/Preferences   Learning Barriers Sight  glasses    Learning Preferences Written Material;Verbal Instruction      Education Topics: Count Your Pulse:  -Group instruction provided by verbal  instruction, demonstration, patient participation and written materials to support subject.  Instructors address importance of being able to find your pulse and how to count your pulse when at home without a heart monitor.  Patients get hands on experience counting their pulse with staff help and individually.   Heart Attack, Angina, and Risk Factor Modification:  -Group instruction provided by verbal instruction, video, and written materials to support subject.  Instructors address signs and symptoms of angina and heart attacks.    Also discuss risk factors for heart disease and how to make changes to improve heart health risk factors.   Functional Fitness:  -Group instruction provided by verbal instruction, demonstration, patient participation, and written materials to support subject.  Instructors address safety measures for doing things around the house.  Discuss how to get up and down off the floor, how to pick things up properly, how to safely get out of a chair without assistance, and balance training.   Meditation and Mindfulness:  -Group instruction provided by verbal instruction, patient participation, and written materials to support subject.  Instructor addresses importance of mindfulness and meditation practice to help reduce stress and improve awareness.  Instructor also leads participants through a meditation exercise.    Stretching for Flexibility and Mobility:  -Group instruction provided by verbal instruction, patient participation, and written materials to support subject.  Instructors lead participants through series of stretches that are designed to increase flexibility thus improving mobility.  These stretches are additional exercise for major muscle groups that are typically performed during regular warm up and cool down. Flowsheet Row CARDIAC REHAB PHASE II EXERCISE from 09/07/2016 in [facility]  Date  08/28/16  Instruction Review Code  2-  meets goals/outcomes      Hands Only CPR Anytime:  -Group instruction provided by verbal instruction, video, patient participation and written materials to support subject.  Instructors co-teach with AHA video for hands only  CPR.  Participants get hands on experience with mannequins.   Nutrition I class: Heart Healthy Eating:  -Group instruction provided by PowerPoint slides, verbal discussion, and written materials to support subject matter. The instructor gives an explanation and review of the Therapeutic Lifestyle Changes diet recommendations, which includes a discussion on lipid goals, dietary fat, sodium, fiber, plant stanol/sterol esters, sugar, and the components of a well-balanced, healthy diet. Flowsheet Row CARDIAC REHAB PHASE II EXERCISE from 09/07/2016 in [facility]  Date  09/01/16  Educator  RD  Instruction Review Code  2- meets goals/outcomes      Nutrition II class: Lifestyle Skills:  -Group instruction provided by PowerPoint slides, verbal discussion, and written materials to support subject matter. The instructor gives an explanation and review of label reading, grocery shopping for heart health, heart healthy recipe modifications, and ways to make healthier choices when eating out. Flowsheet Row CARDIAC REHAB PHASE II EXERCISE from 09/07/2016 in [facility]  Date  09/08/16  Educator  RD  Instruction Review Code  2- meets goals/outcomes      Diabetes Question & Answer:  -Group instruction provided by PowerPoint slides, verbal discussion, and written materials to support subject matter. The instructor gives an explanation and review of diabetes co-morbidities, pre- and post-prandial blood glucose goals, pre-exercise blood glucose goals, signs, symptoms, and treatment of hypoglycemia and hyperglycemia, and foot care basics.   Diabetes Blitz:  -Group instruction provided by PowerPoint slides, verbal discussion,  and written materials to support subject matter. The instructor gives an explanation and review of the physiology behind type 1 and type 2 diabetes, diabetes medications and rational behind using different medications, pre- and post-prandial blood glucose recommendations and Hemoglobin A1c goals, diabetes diet, and exercise including blood glucose guidelines for exercising safely.    Portion Distortion:  -Group instruction provided by PowerPoint slides, verbal discussion, written materials, and food models to support subject matter. The instructor gives an explanation of serving size versus portion size, changes in portions sizes over the last 20 years, and what consists of a serving from each food group. Flowsheet Row CARDIAC REHAB PHASE II EXERCISE from 09/07/2016 in [facility]  Date  09/02/16  Educator  RD  Instruction Review Code  2- meets goals/outcomes      Stress Management:  -Group instruction provided by verbal instruction, video, and written materials to support subject matter.  Instructors review role of stress in heart disease and how to cope with stress positively.   Flowsheet Row CARDIAC REHAB PHASE II EXERCISE from 09/07/2016 in [facility]  Date  08/26/16  Instruction Review Code  2- meets goals/outcomes      Exercising on Your Own:  -Group instruction provided by verbal instruction, power point, and written materials to support subject.  Instructors discuss benefits of exercise, components of exercise, frequency and intensity of exercise, and end points for exercise.  Also discuss use of nitroglycerin and activating EMS.  Review options of places to exercise outside of rehab.  Review guidelines for sex with heart disease.   Cardiac Drugs I:  -Group instruction provided by verbal instruction and written materials to support subject.  Instructor reviews cardiac drug classes: antiplatelets, anticoagulants, beta  blockers, and statins.  Instructor discusses reasons, side effects, and lifestyle considerations for each drug class.   Cardiac Drugs II:  -Group instruction provided by verbal instruction and written materials to support subject.  Instructor reviews  cardiac drug classes: angiotensin converting enzyme inhibitors (ACE-I), angiotensin II receptor blockers (ARBs), nitrates, and calcium channel blockers.  Instructor discusses reasons, side effects, and lifestyle considerations for each drug class.   Anatomy and Physiology of the Circulatory System:  -Group instruction provided by verbal instruction, video, and written materials to support subject.  Reviews functional anatomy of heart, how it relates to various diagnoses, and what role the heart plays in the overall system.   Knowledge Questionnaire Score:     Knowledge Questionnaire Score - 08/18/16 1606      Knowledge Questionnaire Score   Pre Score 23/24      Core Components/Risk Factors/Patient Goals at Admission:     Personal Goals and Risk Factors at Admission - 08/18/16 1507      Core Components/Risk Factors/Patient Goals on Admission   Intervention Provide education on lifestyle modifcations including regular physical activity/exercise, weight management, moderate sodium restriction and increased consumption of fresh fruit, vegetables, and low fat dairy, alcohol moderation, and smoking cessation.;Monitor prescription use compliance.   Expected Outcomes Short Term: Continued assessment and intervention until BP is < 140/86m HG in hypertensive participants. < 130/811mHG in hypertensive participants with diabetes, heart failure or chronic kidney disease.;Long Term: Maintenance of blood pressure at goal levels.   Lipids Yes   Intervention Provide education and support for participant on nutrition & aerobic/resistive exercise along with prescribed medications to achieve LDL '70mg'$ , HDL >'40mg'$ .   Expected Outcomes Short Term: Participant  states understanding of desired cholesterol values and is compliant with medications prescribed. Participant is following exercise prescription and nutrition guidelines.;Long Term: Cholesterol controlled with medications as prescribed, with individualized exercise RX and with personalized nutrition plan. Value goals: LDL < '70mg'$ , HDL > 40 mg.   Stress Yes   Intervention Offer individual and/or small group education and counseling on adjustment to heart disease, stress management and health-related lifestyle change. Teach and support self-help strategies.;Refer participants experiencing significant psychosocial distress to appropriate mental health specialists for further evaluation and treatment. When possible, include family members and significant others in education/counseling sessions.   Expected Outcomes Short Term: Participant demonstrates changes in health-related behavior, relaxation and other stress management skills, ability to obtain effective social support, and compliance with psychotropic medications if prescribed.;Long Term: Emotional wellbeing is indicated by absence of clinically significant psychosocial distress or social isolation.   Personal Goal Other Yes   Personal Goal Increase energy level. Feel better.   Intervention Develop an individualized exercise prescription for aerobic and resistive training based on initial evaluation findings, risk stratification, comorbidities and participant's personal goals.   Expected Outcomes Achievement of increased cardiorespiraory fitness and enhanced flexibility, muscular endurance and strength shown through meazurements of functional capacity and personal statement of participant.      Core Components/Risk Factors/Patient Goals Review:    Core Components/Risk Factors/Patient Goals at Discharge (Final Review):    ITP Comments:     ITP Comments    Row Name 08/18/16 1336 09/04/16 1348         ITP Comments Medical Director- Dr. TrFransico HimMD Patient attended the "Taking the high out of high blood pressure" video/lecture education class on 09/04/16. Met outcomes/goals.         Comments: Pt is making expected progress toward personal goals after completing 6 sessions. Recommend continued exercise and life style modification education including  stress management and relaxation techniques to decrease cardiac risk profile.

## 2016-09-09 ENCOUNTER — Encounter (HOSPITAL_COMMUNITY): Payer: 59

## 2016-09-09 ENCOUNTER — Encounter (HOSPITAL_COMMUNITY)
Admission: RE | Admit: 2016-09-09 | Discharge: 2016-09-09 | Disposition: A | Discharge status: Home / Self Care | Payer: 59 | Source: Ambulatory Visit | Attending: Cardiovascular Disease | Admitting: Cardiovascular Disease

## 2016-09-09 DIAGNOSIS — I213 ST elevation (STEMI) myocardial infarction of unspecified site: Secondary | ICD-10-CM

## 2016-09-09 DIAGNOSIS — Z79899 Other long term (current) drug therapy: Secondary | ICD-10-CM | POA: Diagnosis not present

## 2016-09-09 DIAGNOSIS — Z951 Presence of aortocoronary bypass graft: Secondary | ICD-10-CM | POA: Diagnosis not present

## 2016-09-09 DIAGNOSIS — Z7982 Long term (current) use of aspirin: Secondary | ICD-10-CM | POA: Diagnosis not present

## 2016-09-11 ENCOUNTER — Encounter (HOSPITAL_COMMUNITY)
Admission: RE | Admit: 2016-09-11 | Discharge: 2016-09-11 | Disposition: A | Discharge status: Home / Self Care | Payer: 59 | Source: Ambulatory Visit | Attending: Cardiovascular Disease | Admitting: Cardiovascular Disease

## 2016-09-11 ENCOUNTER — Encounter (HOSPITAL_COMMUNITY): Payer: 59

## 2016-09-11 DIAGNOSIS — Z951 Presence of aortocoronary bypass graft: Secondary | ICD-10-CM | POA: Diagnosis not present

## 2016-09-11 DIAGNOSIS — Z7982 Long term (current) use of aspirin: Secondary | ICD-10-CM | POA: Diagnosis not present

## 2016-09-11 DIAGNOSIS — I213 ST elevation (STEMI) myocardial infarction of unspecified site: Secondary | ICD-10-CM

## 2016-09-11 DIAGNOSIS — Z79899 Other long term (current) drug therapy: Secondary | ICD-10-CM | POA: Diagnosis not present

## 2016-09-14 ENCOUNTER — Encounter (HOSPITAL_COMMUNITY)
Admission: RE | Admit: 2016-09-14 | Discharge: 2016-09-14 | Disposition: A | Discharge status: Home / Self Care | Payer: 59 | Source: Ambulatory Visit | Attending: Cardiovascular Disease | Admitting: Cardiovascular Disease

## 2016-09-14 ENCOUNTER — Encounter (HOSPITAL_COMMUNITY): Payer: 59

## 2016-09-14 DIAGNOSIS — Z7982 Long term (current) use of aspirin: Secondary | ICD-10-CM | POA: Diagnosis not present

## 2016-09-14 DIAGNOSIS — Z951 Presence of aortocoronary bypass graft: Secondary | ICD-10-CM

## 2016-09-14 DIAGNOSIS — I213 ST elevation (STEMI) myocardial infarction of unspecified site: Secondary | ICD-10-CM

## 2016-09-14 DIAGNOSIS — Z79899 Other long term (current) drug therapy: Secondary | ICD-10-CM | POA: Diagnosis not present

## 2016-09-15 ENCOUNTER — Telehealth: Payer: Self-pay | Admitting: Cardiovascular Disease

## 2016-09-15 NOTE — Telephone Encounter (Signed)
Pt wanted Dr Gwenlyn Found or Dr Kennon Holter nurse to be aware that [employer] will be faxing a form for him to complete. This form he will need completed before he can go back to work on 09-22-16.

## 2016-09-15 NOTE — Telephone Encounter (Signed)
Routed to [place] for follow up.

## 2016-09-16 ENCOUNTER — Encounter (HOSPITAL_COMMUNITY): Payer: 59

## 2016-09-16 ENCOUNTER — Encounter (HOSPITAL_COMMUNITY)
Admission: RE | Admit: 2016-09-16 | Discharge: 2016-09-16 | Disposition: A | Discharge status: Home / Self Care | Payer: 59 | Source: Ambulatory Visit | Attending: Cardiovascular Disease | Admitting: Cardiovascular Disease

## 2016-09-16 DIAGNOSIS — I213 ST elevation (STEMI) myocardial infarction of unspecified site: Secondary | ICD-10-CM

## 2016-09-16 DIAGNOSIS — Z951 Presence of aortocoronary bypass graft: Secondary | ICD-10-CM

## 2016-09-16 DIAGNOSIS — Z7982 Long term (current) use of aspirin: Secondary | ICD-10-CM | POA: Diagnosis not present

## 2016-09-16 DIAGNOSIS — Z79899 Other long term (current) drug therapy: Secondary | ICD-10-CM | POA: Diagnosis not present

## 2016-09-16 NOTE — Telephone Encounter (Signed)
Spoke with patient and he stated he really needed to go back to work next week even if it is with restrictions.  After this week his short term disability and FMLA runs out and he will no longer be receiving any income. Will forward to Dr Gwenlyn Found for review of request

## 2016-09-16 NOTE — Telephone Encounter (Signed)
Clarified with Dr Gwenlyn Found that patient may return to work on 09/22/16 with no restrictions.  Called patient and gave him this information. He was very [employer] attorney. Will generate letter and fax to number provided.

## 2016-09-16 NOTE — Telephone Encounter (Signed)
Okay to return to work without restrictions. He can return on November 1.

## 2016-09-16 NOTE — Telephone Encounter (Signed)
Patient came by office requesting a letter be sent to [phone] Attn; Andee Poles clearing him to return to work on 09/22/16.  Letter needs to state return date, any restrictions and if so how long He would like a copy of letter mailed to him as well Will forward to Dr Gwenlyn Found for recommendations

## 2016-09-17 ENCOUNTER — Encounter: Payer: Self-pay | Admitting: *Deleted

## 2016-09-17 MED FILL — ATORVASTATIN 80 MG TABLET: 80 | 30 days supply | Qty: 30 | Fill #1

## 2016-09-17 NOTE — Telephone Encounter (Signed)
Letter generated in [employer]. Released letter to patient's MyChart. Letter faxed to patient's employer as patient requested.

## 2016-09-18 ENCOUNTER — Encounter (HOSPITAL_COMMUNITY)
Admission: RE | Admit: 2016-09-18 | Discharge: 2016-09-18 | Disposition: A | Discharge status: Home / Self Care | Payer: 59 | Source: Ambulatory Visit | Attending: Cardiovascular Disease | Admitting: Cardiovascular Disease

## 2016-09-18 ENCOUNTER — Encounter (HOSPITAL_COMMUNITY): Payer: 59

## 2016-09-18 DIAGNOSIS — Z951 Presence of aortocoronary bypass graft: Secondary | ICD-10-CM | POA: Diagnosis not present

## 2016-09-18 DIAGNOSIS — Z7982 Long term (current) use of aspirin: Secondary | ICD-10-CM | POA: Diagnosis not present

## 2016-09-18 DIAGNOSIS — I213 ST elevation (STEMI) myocardial infarction of unspecified site: Secondary | ICD-10-CM

## 2016-09-18 DIAGNOSIS — Z79899 Other long term (current) drug therapy: Secondary | ICD-10-CM | POA: Diagnosis not present

## 2016-09-21 ENCOUNTER — Encounter (HOSPITAL_COMMUNITY): Payer: 59

## 2016-09-21 ENCOUNTER — Encounter (HOSPITAL_COMMUNITY)
Admission: RE | Admit: 2016-09-21 | Discharge: 2016-09-21 | Disposition: A | Discharge status: Home / Self Care | Payer: 59 | Source: Ambulatory Visit | Attending: Cardiovascular Disease | Admitting: Cardiovascular Disease

## 2016-09-21 DIAGNOSIS — Z79899 Other long term (current) drug therapy: Secondary | ICD-10-CM | POA: Diagnosis not present

## 2016-09-21 DIAGNOSIS — I213 ST elevation (STEMI) myocardial infarction of unspecified site: Secondary | ICD-10-CM | POA: Diagnosis not present

## 2016-09-21 DIAGNOSIS — Z951 Presence of aortocoronary bypass graft: Secondary | ICD-10-CM | POA: Diagnosis not present

## 2016-09-21 DIAGNOSIS — Z7982 Long term (current) use of aspirin: Secondary | ICD-10-CM | POA: Diagnosis not present

## 2016-09-23 ENCOUNTER — Encounter (HOSPITAL_COMMUNITY): Payer: 59

## 2016-09-23 ENCOUNTER — Encounter (HOSPITAL_COMMUNITY)
Admission: RE | Admit: 2016-09-23 | Discharge: 2016-09-23 | Disposition: A | Discharge status: Home / Self Care | Payer: 59 | Source: Ambulatory Visit | Attending: Cardiovascular Disease | Admitting: Cardiovascular Disease

## 2016-09-23 DIAGNOSIS — I213 ST elevation (STEMI) myocardial infarction of unspecified site: Secondary | ICD-10-CM

## 2016-09-23 DIAGNOSIS — Z951 Presence of aortocoronary bypass graft: Secondary | ICD-10-CM

## 2016-09-23 DIAGNOSIS — Z7982 Long term (current) use of aspirin: Secondary | ICD-10-CM | POA: Diagnosis not present

## 2016-09-23 DIAGNOSIS — Z79899 Other long term (current) drug therapy: Secondary | ICD-10-CM | POA: Diagnosis not present

## 2016-09-23 NOTE — Progress Notes (Signed)
Benjamin Robbins 60 y.o. male Nutrition Note Spoke with pt.  Nutrition Survey reviewed with pt. Pt is working toward following the Therapeutic Lifestyle Changes diet. Pt expressed understanding of the information reviewed. Pt aware of nutrition education classes offered and has attended nutrition classes. Lab Results  Component Value Date   HGBA1C 5.7 (H) 06/26/2016   Wt Readings from Last 3 Encounters:  09/01/16 171 lb (77.6 kg)  08/18/16 169 lb 15.6 oz (77.1 kg)  07/29/16 170 lb (77.1 kg)   Nutrition Diagnosis ? Food-and nutrition-related knowledge deficit related to lack of exposure to information as related to diagnosis of: ? CVD ? Pre-DM  Nutrition Intervention ? Benefits of adopting Therapeutic Lifestyle Changes discussed when Med[employer]s reviewed. ? Pt to attend the Portion Distortion class - met; 09/02/16 ? Pt to attend the  ? Nutrition I class - met; 09/01/16                         ? Nutrition II class - met; 09/08/16 ? Continue client-centered nutrition education by RD, as part of interdisciplinary care.  Goal(s) ? Pt to identify and limit food sources of saturated fat, trans fat, and sodium  Monitor and Evaluate progress toward nutrition goal with team.  Derek Mound, M.Ed, RD, LDN, CDE 09/23/2016 10:52 AM

## 2016-09-25 ENCOUNTER — Encounter (HOSPITAL_COMMUNITY): Payer: 59

## 2016-09-25 ENCOUNTER — Encounter (HOSPITAL_COMMUNITY)
Admission: RE | Admit: 2016-09-25 | Discharge: 2016-09-25 | Disposition: A | Discharge status: Home / Self Care | Payer: 59 | Source: Ambulatory Visit | Attending: Cardiovascular Disease | Admitting: Cardiovascular Disease

## 2016-09-25 DIAGNOSIS — I213 ST elevation (STEMI) myocardial infarction of unspecified site: Secondary | ICD-10-CM | POA: Diagnosis not present

## 2016-09-25 DIAGNOSIS — Z79899 Other long term (current) drug therapy: Secondary | ICD-10-CM | POA: Diagnosis not present

## 2016-09-25 DIAGNOSIS — Z7982 Long term (current) use of aspirin: Secondary | ICD-10-CM | POA: Diagnosis not present

## 2016-09-25 DIAGNOSIS — Z951 Presence of aortocoronary bypass graft: Secondary | ICD-10-CM

## 2016-09-28 ENCOUNTER — Encounter (HOSPITAL_COMMUNITY)
Admission: RE | Admit: 2016-09-28 | Discharge: 2016-09-28 | Disposition: A | Discharge status: Home / Self Care | Payer: 59 | Source: Ambulatory Visit | Attending: Cardiovascular Disease | Admitting: Cardiovascular Disease

## 2016-09-28 ENCOUNTER — Encounter (HOSPITAL_COMMUNITY): Payer: 59

## 2016-09-28 DIAGNOSIS — Z951 Presence of aortocoronary bypass graft: Secondary | ICD-10-CM

## 2016-09-28 DIAGNOSIS — I213 ST elevation (STEMI) myocardial infarction of unspecified site: Secondary | ICD-10-CM

## 2016-09-28 DIAGNOSIS — Z7982 Long term (current) use of aspirin: Secondary | ICD-10-CM | POA: Diagnosis not present

## 2016-09-28 DIAGNOSIS — Z79899 Other long term (current) drug therapy: Secondary | ICD-10-CM | POA: Diagnosis not present

## 2016-09-30 ENCOUNTER — Encounter (HOSPITAL_COMMUNITY): Payer: 59

## 2016-09-30 ENCOUNTER — Encounter (HOSPITAL_COMMUNITY)
Admission: RE | Admit: 2016-09-30 | Discharge: 2016-09-30 | Disposition: A | Discharge status: Home / Self Care | Payer: 59 | Source: Ambulatory Visit | Attending: Cardiovascular Disease | Admitting: Cardiovascular Disease

## 2016-09-30 DIAGNOSIS — I213 ST elevation (STEMI) myocardial infarction of unspecified site: Secondary | ICD-10-CM | POA: Diagnosis not present

## 2016-09-30 DIAGNOSIS — Z7982 Long term (current) use of aspirin: Secondary | ICD-10-CM | POA: Insufficient documentation

## 2016-09-30 DIAGNOSIS — Z951 Presence of aortocoronary bypass graft: Secondary | ICD-10-CM | POA: Insufficient documentation

## 2016-09-30 DIAGNOSIS — Z79899 Other long term (current) drug therapy: Secondary | ICD-10-CM | POA: Insufficient documentation

## 2016-10-02 ENCOUNTER — Encounter (HOSPITAL_COMMUNITY)
Admission: RE | Admit: 2016-10-02 | Discharge: 2016-10-02 | Disposition: A | Discharge status: Home / Self Care | Payer: 59 | Source: Ambulatory Visit | Attending: Cardiovascular Disease | Admitting: Cardiovascular Disease

## 2016-10-02 ENCOUNTER — Encounter (HOSPITAL_COMMUNITY): Payer: 59

## 2016-10-02 DIAGNOSIS — Z951 Presence of aortocoronary bypass graft: Secondary | ICD-10-CM

## 2016-10-02 DIAGNOSIS — Z79899 Other long term (current) drug therapy: Secondary | ICD-10-CM | POA: Diagnosis not present

## 2016-10-02 DIAGNOSIS — I213 ST elevation (STEMI) myocardial infarction of unspecified site: Secondary | ICD-10-CM

## 2016-10-02 DIAGNOSIS — Z7982 Long term (current) use of aspirin: Secondary | ICD-10-CM | POA: Diagnosis not present

## 2016-10-05 ENCOUNTER — Encounter (HOSPITAL_COMMUNITY): Payer: 59

## 2016-10-05 ENCOUNTER — Encounter (HOSPITAL_COMMUNITY)
Admission: RE | Admit: 2016-10-05 | Discharge: 2016-10-05 | Disposition: A | Discharge status: Home / Self Care | Payer: 59 | Source: Ambulatory Visit | Attending: Cardiovascular Disease | Admitting: Cardiovascular Disease

## 2016-10-05 DIAGNOSIS — Z7982 Long term (current) use of aspirin: Secondary | ICD-10-CM | POA: Diagnosis not present

## 2016-10-05 DIAGNOSIS — Z951 Presence of aortocoronary bypass graft: Secondary | ICD-10-CM | POA: Diagnosis not present

## 2016-10-05 DIAGNOSIS — Z79899 Other long term (current) drug therapy: Secondary | ICD-10-CM | POA: Diagnosis not present

## 2016-10-05 DIAGNOSIS — I213 ST elevation (STEMI) myocardial infarction of unspecified site: Secondary | ICD-10-CM | POA: Diagnosis not present

## 2016-10-06 NOTE — Progress Notes (Signed)
Cardiac Individual Treatment Plan  Patient Details  Name: Benjamin Robbins MRN: [id number] Date of Birth: 03/13/1956 Referring Provider:   Flowsheet Row CARDIAC REHAB PHASE II ORIENTATION from 08/18/2016 in [place]  Referring Provider  Quay Burow, MD      Initial Encounter Date:  Flowsheet Row CARDIAC REHAB PHASE II ORIENTATION from 08/18/2016 in [place]  Date  08/18/16  Referring Provider  Quay Burow, MD      Visit Diagnosis: S/P CABG x 4  ST elevation myocardial infarction (STEMI), unspecified artery ([place])  Patient's Home Medications on Admission:  Current Outpatient Prescriptions:  .  aspirin 81 MG tablet, Take 81 mg by mouth daily., Disp: , Rfl:  .  atorvastatin (LIPITOR) 80 MG tablet, Take 1 tablet (80 mg total) by mouth daily at 6 PM., Disp: 30 tablet, Rfl: 3 .  carvedilol (COREG) 12.5 MG tablet, Take 1.5 tablets (18.75 mg total) by mouth 2 (two) times daily., Disp: 270 tablet, Rfl: 3 .  esomeprazole (NEXIUM) 40 MG capsule, Take 40 mg by mouth daily as needed (reflux)., Disp: , Rfl:  .  lisinopril (PRINIVIL,ZESTRIL) 5 MG tablet, Take 1 tablet (5 mg total) by mouth daily., Disp: 90 tablet, Rfl: 3 .  Multiple Vitamin (MULTIVITAMINS PO), Take 1 tablet by mouth daily., Disp: , Rfl:  .  topiramate (TOPAMAX) 100 MG tablet, Take 100 mg by mouth at bedtime., Disp: , Rfl:  .  VITAMIN D, ERGOCALCIFEROL, PO, Take 5,000 Units by mouth daily., Disp: , Rfl:   Past Medical History: Past Medical History:  Diagnosis Date  . Cancer - prostate   . GERD (gastroesophageal reflux disease)   . Migraine     Tobacco Use: History  Smoking Status  . Never Smoker  Smokeless Tobacco  . Never Used    Labs: Recent Review Flowsheet Data    Labs for ITP Cardiac and Pulmonary Rehab Latest Ref Rng & Units 06/26/2016 06/26/2016 06/26/2016 06/26/2016 09/01/2016   Cholestrol 125 - 200 mg/dL - - - - 93(L)   LDLCALC <130  mg/dL - - - - 53   HDL >=40 mg/dL - - - - 27(L)   Trlycerides <150 mg/dL - - - - 64   Hemoglobin A1c 4.8 - 5.6 % - - - - -   PHART 7.350 - 7.450 - 7.350 7.323(L) - -   PCO2ART 35.0 - 45.0 mmHg - 34.7(L) 35.9 - -   HCO3 20.0 - 24.0 mEq/L - 19.0(L) 18.6(L) - -   TCO2 0 - 100 mmol/L _0 -   ACIDBASEDEF 0.0 - 2.0 mmol/L - 6.0(H) 7.0(H) - -   O2SAT % - 99.0 98.0 - -      Capillary Blood Glucose: Lab Results  Component Value Date   GLUCAP 95 06/29/2016   GLUCAP 104 (H) 06/28/2016   GLUCAP 122 (H) 06/28/2016   GLUCAP 114 (H) 06/28/2016   GLUCAP 105 (H) 06/28/2016     Exercise Target Goals:    Exercise Program Goal: Individual exercise prescription set with THRR, safety & activity barriers. Participant demonstrates ability to understand and report RPE using BORG scale, to self-measure pulse accurately, and to acknowledge the importance of the exercise prescription.  Exercise Prescription Goal: Starting with aerobic activity 30 plus minutes a day, 3 days per week for initial exercise prescription. Provide home exercise prescription and guidelines that participant acknowledges understanding prior to discharge.  Activity Barriers & Risk Stratification:  Activity Barriers & Cardiac Risk Stratification - 08/18/16 1406      Activity Barriers & Cardiac Risk Stratification   Activity Barriers None   Cardiac Risk Stratification High      6 Minute Walk:     6 Minute Walk    Row Name 08/18/16 1420 08/18/16 1629       6 Minute Walk   Phase  - Initial    Distance  - 1421 feet    Walk Time 6 minutes 6 minutes    MPH  - 2.69    METS  - 3.88    RPE 11 11    VO2 Peak  - 13.59    Symptoms  - No    Resting HR 80 bpm  -    Resting BP 104/60  -    Max Ex. HR 89 bpm  -    Max Ex. BP 124/70  -    2 Minute Post BP 124/70  -       Initial Exercise Prescription:     Initial Exercise Prescription - 08/18/16 1600      Date of Initial Exercise RX and Referring Provider    Date 08/18/16   Referring Provider Quay Burow, MD     Treadmill   MPH 2.5   Grade 1   Minutes 10   METs 3.26     Bike   Level 1.2   Minutes 10   METs 3.93     NuStep   Level 3   Minutes 10   METs 2.5     Prescription Details   Frequency (times per week) 3   Duration Progress to 30 minutes of continuous aerobic without signs/symptoms of physical distress     Intensity   THRR 40-80% of Max Heartrate 64-128   Ratings of Perceived Exertion 11-13   Perceived Dyspnea 0-4     Progression   Progression Continue to progress workloads to maintain intensity without signs/symptoms of physical distress.     Resistance Training   Training Prescription Yes   Weight 2lbs   Reps 10-12      Perform Capillary Blood Glucose checks as needed.  Exercise Prescription Changes:      Exercise Prescription Changes    Row Name 09/09/16 1100 09/25/16 1500 10/05/16 1100         Exercise Review   Progression Yes Yes Yes       Response to Exercise   Blood Pressure (Admit) 130/64 128/86 128/84     Blood Pressure (Exercise) 142/72 150/76 140/66     Blood Pressure (Exit) 112/72 126/76 114/70     Heart Rate (Admit) 75 bpm 79 bpm 82 bpm     Heart Rate (Exercise) 130 bpm 127 bpm 126 bpm     Heart Rate (Exit) 75 bpm 82 bpm 86 bpm     Rating of Perceived Exertion (Exercise) _0 Comments Reviewed HEP on 08/28/16 Reviewed HEP on 08/28/16 Reviewed HEP on 08/28/16       Progression   Average METs 4.2 5.1 6.2       Resistance Training   Training Prescription Yes Yes Yes     Weight 4lbs 4lbs 5lbs     Reps 10-12 10-12 10-12       Treadmill   MPH 3 3.2 3.2     Grade _1 Minutes _2 METs 3.71 5.66 5.66  Bike   Level 1.2 1.5 2.5     Minutes _0 METs 3.93 4.64 7.12       NuStep   Level _1 Minutes _2 METs 4.9 5.1 5.9       Home Exercise Plan   Plans to continue exercise at Home  reviewed HEP on 08/28/16 Home  reviewed HEP  on 08/28/16 Home  reviewed HEP on 08/28/16     Frequency Add 2 additional days to program exercise sessions. Add 2 additional days to program exercise sessions. Add 2 additional days to program exercise sessions.        Exercise Comments:      Exercise Comments    Row Name 09/23/16 1817 10/05/16 1149         Exercise Comments Reviewed METs and goals. Pt is tolerating exercise well; will continue to monitor exercise progression. Reviewed METs. Pt is tolerating exercise well; will continue to monitor exercise progression.         Discharge Exercise Prescription (Final Exercise Prescription Changes):     Exercise Prescription Changes - 10/05/16 1100      Exercise Review   Progression Yes     Response to Exercise   Blood Pressure (Admit) 128/84   Blood Pressure (Exercise) 140/66   Blood Pressure (Exit) 114/70   Heart Rate (Admit) 82 bpm   Heart Rate (Exercise) 126 bpm   Heart Rate (Exit) 86 bpm   Rating of Perceived Exertion (Exercise) 13   Comments Reviewed HEP on 08/28/16     Progression   Average METs 6.2     Resistance Training   Training Prescription Yes   Weight 5lbs   Reps 10-12     Treadmill   MPH 3.2   Grade 5   Minutes 10   METs 5.66     Bike   Level 2.5   Minutes 10   METs 7.12     NuStep   Level 5   Minutes 10   METs 5.9     Home Exercise Plan   Plans to continue exercise at Home  reviewed HEP on 08/28/16   Frequency Add 2 additional days to program exercise sessions.      Nutrition:  Target Goals: Understanding of nutrition guidelines, daily intake of sodium <154m, cholesterol <2086m calories 30% from fat and 7% or less from saturated fats, daily to have 5 or more servings of fruits and vegetables.  Biometrics:     Pre Biometrics - 08/18/16 1640      Pre Biometrics   Height 6' 0.75" (1.848 m)   Weight 169 lb 15.6 oz (77.1 kg)   Waist Circumference 34 inches   Hip Circumference 38 inches   Waist to Hip Ratio 0.89 %   BMI  (Calculated) 22.6   Triceps Skinfold 13 mm   % Body Fat 21.7 %   Grip Strength 37.5 kg   Flexibility 9 in   Single Leg Stand 30 seconds       Nutrition Therapy Plan and Nutrition Goals:   Nutrition Discharge: Nutrition Scores:     Nutrition Assessments - 09/23/16 1114      MEDFICTS Scores   Pre Score 83      Nutrition Goals Re-Evaluation:   Psychosocial: Target Goals: Acknowledge presence or absence of depression, maximize coping skills, provide positive support system. Participant is able to verbalize types and ability to use techniques  and skills needed for reducing stress and depression.  Initial Review & Psychosocial Screening:     Initial Psych Review & Screening - 08/18/16 [place]? Yes     Barriers   Psychosocial barriers to participate in program There are no identifiable barriers or psychosocial needs.     Screening Interventions   Interventions Encouraged to exercise  no psychosocial interventions necessary       Quality of Life Scores:     Quality of Life - 10/07/16 1751      Quality of Life Scores   Health/Function Pre --  Anthany is feeling better. Shortness of breath and energy are improving.   Socioeconomic Pre --  Neslon relies more on support from his family than friends.   Psych/Spiritual Pre --  Feels better spiritually   GLOBAL Pre --  Reviewed quality of life with Meda Coffee. Will forward to Dr  Kennon Holter office  for review.      PHQ-9: Recent Review Flowsheet Data    Depression screen [facility] 2/9 08/24/2016   Decreased Interest 0   Down, Depressed, Hopeless 0   PHQ - 2 Score 0      Psychosocial Evaluation and Intervention:     Psychosocial Evaluation - 09/07/16 1648      Psychosocial Evaluation & Interventions   Interventions Encouraged to exercise with the program and follow exercise prescription   Comments no psychosocial neds idenified, no interventions necessary, pt states he feels the  program is helping him regain strength and stamina.  he is planning to return to work in 2 weeks.    Continued Psychosocial Services Needed No      Psychosocial Re-Evaluation:     Psychosocial Re-Evaluation    [place] Name 10/06/16 1153             Psychosocial Re-Evaluation   Interventions Encouraged to attend Cardiac Rehabilitation for the exercise       Comments No needs identifed at this time.       Continued Psychosocial Services Needed No          Vocational Rehabilitation: Provide vocational rehab assistance to qualifying candidates.   Vocational Rehab Evaluation & Intervention:     Vocational Rehab - 08/18/16 1606      Initial Vocational Rehab Evaluation & Intervention   Assessment shows need for Vocational Rehabilitation No      Education: Education Goals: Education classes will be provided on a weekly basis, covering required topics. Participant will state understanding/return demonstration of topics presented.  Learning Barriers/Preferences:     Learning Barriers/Preferences - 08/18/16 1605      Learning Barriers/Preferences   Learning Barriers Sight  glasses    Learning Preferences Written Material;Verbal Instruction      Education Topics: Count Your Pulse:  -Group instruction provided by verbal instruction, demonstration, patient participation and written materials to support subject.  Instructors address importance of being able to find your pulse and how to count your pulse when at home without a heart monitor.  Patients get hands on experience counting their pulse with staff help and individually.   Heart Attack, Angina, and Risk Factor Modification:  -Group instruction provided by verbal instruction, video, and written materials to support subject.  Instructors address signs and symptoms of angina and heart attacks.    Also discuss risk factors for heart disease and how to make changes to improve heart health risk factors. [place]  REHAB PHASE II  EXERCISE from 09/18/2016 in [place]  Date  09/16/16  Instruction Review Code  2- meets goals/outcomes      Functional Fitness:  -Group instruction provided by verbal instruction, demonstration, patient participation, and written materials to support subject.  Instructors address safety measures for doing things around the house.  Discuss how to get up and down off the floor, how to pick things up properly, how to safely get out of a chair without assistance, and balance training. Flowsheet Row CARDIAC REHAB PHASE II EXERCISE from 09/18/2016 in [place]  Date  09/18/16  Instruction Review Code  2- meets goals/outcomes      Meditation and Mindfulness:  -Group instruction provided by verbal instruction, patient participation, and written materials to support subject.  Instructor addresses importance of mindfulness and meditation practice to help reduce stress and improve awareness.  Instructor also leads participants through a meditation exercise.    Stretching for Flexibility and Mobility:  -Group instruction provided by verbal instruction, patient participation, and written materials to support subject.  Instructors lead participants through series of stretches that are designed to increase flexibility thus improving mobility.  These stretches are additional exercise for major muscle groups that are typically performed during regular warm up and cool down. Flowsheet Row CARDIAC REHAB PHASE II EXERCISE from 09/18/2016 in [place]  Date  08/28/16  Instruction Review Code  2- meets goals/outcomes      Hands Only CPR Anytime:  -Group instruction provided by verbal instruction, video, patient participation and written materials to support subject.  Instructors co-teach with AHA video for hands only CPR.  Participants get hands on experience with mannequins.   Nutrition I  class: Heart Healthy Eating:  -Group instruction provided by PowerPoint slides, verbal discussion, and written materials to support subject matter. The instructor gives an explanation and review of the Therapeutic Lifestyle Changes diet recommendations, which includes a discussion on lipid goals, dietary fat, sodium, fiber, plant stanol/sterol esters, sugar, and the components of a well-balanced, healthy diet. Flowsheet Row CARDIAC REHAB PHASE II EXERCISE from 09/18/2016 in [place]  Date  09/01/16  Educator  RD  Instruction Review Code  2- meets goals/outcomes      Nutrition II class: Lifestyle Skills:  -Group instruction provided by PowerPoint slides, verbal discussion, and written materials to support subject matter. The instructor gives an explanation and review of label reading, grocery shopping for heart health, heart healthy recipe modifications, and ways to make healthier choices when eating out. Flowsheet Row CARDIAC REHAB PHASE II EXERCISE from 09/18/2016 in [place]  Date  09/08/16  Educator  RD  Instruction Review Code  2- meets goals/outcomes      Diabetes Question & Answer:  -Group instruction provided by PowerPoint slides, verbal discussion, and written materials to support subject matter. The instructor gives an explanation and review of diabetes co-morbidities, pre- and post-prandial blood glucose goals, pre-exercise blood glucose goals, signs, symptoms, and treatment of hypoglycemia and hyperglycemia, and foot care basics. Flowsheet Row CARDIAC REHAB PHASE II EXERCISE from 09/18/2016 in [place]  Date  09/11/16  Educator  RD  Instruction Review Code  2- meets goals/outcomes      Diabetes Blitz:  -Group instruction provided by PowerPoint slides, verbal discussion, and written materials to support subject matter. The instructor gives an explanation and review of the  physiology behind type 1  and type 2 diabetes, diabetes medications and rational behind using different medications, pre- and post-prandial blood glucose recommendations and Hemoglobin A1c goals, diabetes diet, and exercise including blood glucose guidelines for exercising safely.    Portion Distortion:  -Group instruction provided by PowerPoint slides, verbal discussion, written materials, and food models to support subject matter. The instructor gives an explanation of serving size versus portion size, changes in portions sizes over the last 20 years, and what consists of a serving from each food group. Flowsheet Row CARDIAC REHAB PHASE II EXERCISE from 09/18/2016 in [place]  Date  09/02/16  Educator  RD  Instruction Review Code  2- meets goals/outcomes      Stress Management:  -Group instruction provided by verbal instruction, video, and written materials to support subject matter.  Instructors review role of stress in heart disease and how to cope with stress positively.   Flowsheet Row CARDIAC REHAB PHASE II EXERCISE from 09/18/2016 in [place]  Date  08/26/16  Instruction Review Code  2- meets goals/outcomes      Exercising on Your Own:  -Group instruction provided by verbal instruction, power point, and written materials to support subject.  Instructors discuss benefits of exercise, components of exercise, frequency and intensity of exercise, and end points for exercise.  Also discuss use of nitroglycerin and activating EMS.  Review options of places to exercise outside of rehab.  Review guidelines for sex with heart disease.   Cardiac Drugs I:  -Group instruction provided by verbal instruction and written materials to support subject.  Instructor reviews cardiac drug classes: antiplatelets, anticoagulants, beta blockers, and statins.  Instructor discusses reasons, side effects, and lifestyle considerations for each  drug class. Flowsheet Row CARDIAC REHAB PHASE II EXERCISE from 09/18/2016 in [place]  Date  09/09/16  Instruction Review Code  2- meets goals/outcomes      Cardiac Drugs II:  -Group instruction provided by verbal instruction and written materials to support subject.  Instructor reviews cardiac drug classes: angiotensin converting enzyme inhibitors (ACE-I), angiotensin II receptor blockers (ARBs), nitrates, and calcium channel blockers.  Instructor discusses reasons, side effects, and lifestyle considerations for each drug class.   Anatomy and Physiology of the Circulatory System:  -Group instruction provided by verbal instruction, video, and written materials to support subject.  Reviews functional anatomy of heart, how it relates to various diagnoses, and what role the heart plays in the overall system.   Knowledge Questionnaire Score:     Knowledge Questionnaire Score - 08/18/16 1606      Knowledge Questionnaire Score   Pre Score 23/24      Core Components/Risk Factors/Patient Goals at Admission:     Personal Goals and Risk Factors at Admission - 08/18/16 1507      Core Components/Risk Factors/Patient Goals on Admission   Intervention Provide education on lifestyle modifcations including regular physical activity/exercise, weight management, moderate sodium restriction and increased consumption of fresh fruit, vegetables, and low fat dairy, alcohol moderation, and smoking cessation.;Monitor prescription use compliance.   Expected Outcomes Short Term: Continued assessment and intervention until BP is < 140/10m HG in hypertensive participants. < 130/858mHG in hypertensive participants with diabetes, heart failure or chronic kidney disease.;Long Term: Maintenance of blood pressure at goal levels.   Lipids Yes   Intervention Provide education and support for participant on nutrition & aerobic/resistive exercise along with prescribed medications to  achieve LDL <7078mHDL >33m11m  Expected Outcomes Short Term: Participant states understanding of desired cholesterol values and is compliant with medications prescribed. Participant is following exercise prescription and nutrition guidelines.;Long Term: Cholesterol controlled with medications as prescribed, with individualized exercise RX and with personalized nutrition plan. Value goals: LDL < 30m, HDL > 40 mg.   Stress Yes   Intervention Offer individual and/or small group education and counseling on adjustment to heart disease, stress management and health-related lifestyle change. Teach and support self-help strategies.;Refer participants experiencing significant psychosocial distress to appropriate mental health specialists for further evaluation and treatment. When possible, include family members and significant others in education/counseling sessions.   Expected Outcomes Short Term: Participant demonstrates changes in health-related behavior, relaxation and other stress management skills, ability to obtain effective social support, and compliance with psychotropic medications if prescribed.;Long Term: Emotional wellbeing is indicated by absence of clinically significant psychosocial distress or social isolation.   Personal Goal Other Yes   Personal Goal Increase energy level. Feel better.   Intervention Develop an individualized exercise prescription for aerobic and resistive training based on initial evaluation findings, risk stratification, comorbidities and participant's personal goals.   Expected Outcomes Achievement of increased cardiorespiraory fitness and enhanced flexibility, muscular endurance and strength shown through meazurements of functional capacity and personal statement of participant.      Core Components/Risk Factors/Patient Goals Review:      Goals and Risk Factor Review    Row Name 09/23/16 1817             Core Components/Risk Factors/Patient Goals Review    Personal Goals Review Increase Strength and Stamina       Review Pt has noticed an improvement and energy and stamina. Pt has returned to work and can work up to 5 hours without fatigue. Pt finds cardiac rehab to be helpful/beneficial       Expected Outcomes Pt will continue to decrease symptoms of fatigue with increase activity levels.          Core Components/Risk Factors/Patient Goals at Discharge (Final Review):      Goals and Risk Factor Review - 09/23/16 1817      Core Components/Risk Factors/Patient Goals Review   Personal Goals Review Increase Strength and Stamina   Review Pt has noticed an improvement and energy and stamina. Pt has returned to work and can work up to 5 hours without fatigue. Pt finds cardiac rehab to be helpful/beneficial   Expected Outcomes Pt will continue to decrease symptoms of fatigue with increase activity levels.      ITP Comments:     ITP Comments    Row Name 08/18/16 1336 09/04/16 1348         ITP Comments Medical Director- Dr. TFransico Him MD Patient attended the "Taking the high out of high blood pressure" video/lecture education class on 09/04/16. Met outcomes/goals.         Comments: NVega is making expected progress toward personal goals after completing 20 sessions. Recommend continued exercise and life style modification education including  stress management and relaxation techniques to decrease cardiac risk profile. NZariushas returned to work and is doing well with exercise.MHarrell GaveRN BSN

## 2016-10-07 ENCOUNTER — Encounter (HOSPITAL_COMMUNITY): Payer: 59

## 2016-10-07 ENCOUNTER — Encounter (HOSPITAL_COMMUNITY)
Admission: RE | Admit: 2016-10-07 | Discharge: 2016-10-07 | Disposition: A | Discharge status: Home / Self Care | Payer: 59 | Source: Ambulatory Visit | Attending: Cardiovascular Disease | Admitting: Cardiovascular Disease

## 2016-10-07 DIAGNOSIS — Z951 Presence of aortocoronary bypass graft: Secondary | ICD-10-CM | POA: Diagnosis not present

## 2016-10-07 DIAGNOSIS — Z7982 Long term (current) use of aspirin: Secondary | ICD-10-CM | POA: Diagnosis not present

## 2016-10-07 DIAGNOSIS — I213 ST elevation (STEMI) myocardial infarction of unspecified site: Secondary | ICD-10-CM

## 2016-10-07 DIAGNOSIS — Z79899 Other long term (current) drug therapy: Secondary | ICD-10-CM | POA: Diagnosis not present

## 2016-10-08 ENCOUNTER — Other Ambulatory Visit (HOSPITAL_COMMUNITY): Payer: 59

## 2016-10-08 DIAGNOSIS — J069 Acute upper respiratory infection, unspecified: Secondary | ICD-10-CM | POA: Diagnosis not present

## 2016-10-09 ENCOUNTER — Encounter (HOSPITAL_COMMUNITY): Payer: 59

## 2016-10-09 ENCOUNTER — Encounter (HOSPITAL_COMMUNITY)
Admission: RE | Admit: 2016-10-09 | Discharge: 2016-10-09 | Disposition: A | Discharge status: Home / Self Care | Payer: 59 | Source: Ambulatory Visit | Attending: Cardiovascular Disease | Admitting: Cardiovascular Disease

## 2016-10-09 DIAGNOSIS — Z7982 Long term (current) use of aspirin: Secondary | ICD-10-CM | POA: Diagnosis not present

## 2016-10-09 DIAGNOSIS — Z79899 Other long term (current) drug therapy: Secondary | ICD-10-CM | POA: Diagnosis not present

## 2016-10-09 DIAGNOSIS — Z951 Presence of aortocoronary bypass graft: Secondary | ICD-10-CM

## 2016-10-09 DIAGNOSIS — I213 ST elevation (STEMI) myocardial infarction of unspecified site: Secondary | ICD-10-CM | POA: Diagnosis not present

## 2016-10-12 ENCOUNTER — Encounter (HOSPITAL_COMMUNITY): Payer: 59

## 2016-10-12 ENCOUNTER — Encounter (HOSPITAL_COMMUNITY)
Admission: RE | Admit: 2016-10-12 | Discharge: 2016-10-12 | Disposition: A | Discharge status: Home / Self Care | Payer: 59 | Source: Ambulatory Visit | Attending: Cardiovascular Disease | Admitting: Cardiovascular Disease

## 2016-10-12 DIAGNOSIS — Z79899 Other long term (current) drug therapy: Secondary | ICD-10-CM | POA: Diagnosis not present

## 2016-10-12 DIAGNOSIS — Z951 Presence of aortocoronary bypass graft: Secondary | ICD-10-CM

## 2016-10-12 DIAGNOSIS — I213 ST elevation (STEMI) myocardial infarction of unspecified site: Secondary | ICD-10-CM | POA: Diagnosis not present

## 2016-10-12 DIAGNOSIS — Z7982 Long term (current) use of aspirin: Secondary | ICD-10-CM | POA: Diagnosis not present

## 2016-10-13 ENCOUNTER — Other Ambulatory Visit: Payer: Self-pay

## 2016-10-13 ENCOUNTER — Ambulatory Visit (HOSPITAL_COMMUNITY): Payer: 59 | Attending: Cardiovascular Disease

## 2016-10-13 DIAGNOSIS — I255 Ischemic cardiomyopathy: Secondary | ICD-10-CM | POA: Insufficient documentation

## 2016-10-13 DIAGNOSIS — I519 Heart disease, unspecified: Secondary | ICD-10-CM

## 2016-10-13 DIAGNOSIS — I252 Old myocardial infarction: Secondary | ICD-10-CM | POA: Insufficient documentation

## 2016-10-13 DIAGNOSIS — Z951 Presence of aortocoronary bypass graft: Secondary | ICD-10-CM | POA: Diagnosis not present

## 2016-10-13 DIAGNOSIS — I083 Combined rheumatic disorders of mitral, aortic and tricuspid valves: Secondary | ICD-10-CM | POA: Diagnosis not present

## 2016-10-13 DIAGNOSIS — E785 Hyperlipidemia, unspecified: Secondary | ICD-10-CM | POA: Diagnosis not present

## 2016-10-14 ENCOUNTER — Encounter (HOSPITAL_COMMUNITY)
Admission: RE | Admit: 2016-10-14 | Discharge: 2016-10-14 | Disposition: A | Discharge status: Home / Self Care | Payer: 59 | Source: Ambulatory Visit | Attending: Cardiovascular Disease | Admitting: Cardiovascular Disease

## 2016-10-14 ENCOUNTER — Encounter (HOSPITAL_COMMUNITY): Payer: 59

## 2016-10-14 DIAGNOSIS — Z951 Presence of aortocoronary bypass graft: Secondary | ICD-10-CM | POA: Diagnosis not present

## 2016-10-14 DIAGNOSIS — I213 ST elevation (STEMI) myocardial infarction of unspecified site: Secondary | ICD-10-CM

## 2016-10-14 DIAGNOSIS — Z79899 Other long term (current) drug therapy: Secondary | ICD-10-CM | POA: Diagnosis not present

## 2016-10-14 DIAGNOSIS — Z7982 Long term (current) use of aspirin: Secondary | ICD-10-CM | POA: Diagnosis not present

## 2016-10-16 ENCOUNTER — Encounter (HOSPITAL_COMMUNITY): Payer: 59

## 2016-10-16 ENCOUNTER — Encounter (HOSPITAL_COMMUNITY)
Admission: RE | Admit: 2016-10-16 | Discharge: 2016-10-16 | Disposition: A | Discharge status: Home / Self Care | Payer: 59 | Source: Ambulatory Visit | Attending: Cardiovascular Disease | Admitting: Cardiovascular Disease

## 2016-10-16 DIAGNOSIS — Z7982 Long term (current) use of aspirin: Secondary | ICD-10-CM | POA: Diagnosis not present

## 2016-10-16 DIAGNOSIS — I213 ST elevation (STEMI) myocardial infarction of unspecified site: Secondary | ICD-10-CM

## 2016-10-16 DIAGNOSIS — Z951 Presence of aortocoronary bypass graft: Secondary | ICD-10-CM

## 2016-10-16 DIAGNOSIS — Z79899 Other long term (current) drug therapy: Secondary | ICD-10-CM | POA: Diagnosis not present

## 2016-10-19 ENCOUNTER — Encounter (HOSPITAL_COMMUNITY)
Admission: RE | Admit: 2016-10-19 | Discharge: 2016-10-19 | Disposition: A | Discharge status: Home / Self Care | Payer: 59 | Source: Ambulatory Visit | Attending: Cardiovascular Disease | Admitting: Cardiovascular Disease

## 2016-10-19 ENCOUNTER — Encounter (HOSPITAL_COMMUNITY): Payer: 59

## 2016-10-19 DIAGNOSIS — I213 ST elevation (STEMI) myocardial infarction of unspecified site: Secondary | ICD-10-CM

## 2016-10-19 DIAGNOSIS — Z7982 Long term (current) use of aspirin: Secondary | ICD-10-CM | POA: Diagnosis not present

## 2016-10-19 DIAGNOSIS — Z79899 Other long term (current) drug therapy: Secondary | ICD-10-CM | POA: Diagnosis not present

## 2016-10-19 DIAGNOSIS — Z951 Presence of aortocoronary bypass graft: Secondary | ICD-10-CM | POA: Diagnosis not present

## 2016-10-21 ENCOUNTER — Encounter (HOSPITAL_COMMUNITY): Payer: 59

## 2016-10-21 ENCOUNTER — Encounter (HOSPITAL_COMMUNITY)
Admission: RE | Admit: 2016-10-21 | Discharge: 2016-10-21 | Disposition: A | Discharge status: Home / Self Care | Payer: 59 | Source: Ambulatory Visit | Attending: Cardiovascular Disease | Admitting: Cardiovascular Disease

## 2016-10-21 DIAGNOSIS — Z951 Presence of aortocoronary bypass graft: Secondary | ICD-10-CM | POA: Diagnosis not present

## 2016-10-21 DIAGNOSIS — Z79899 Other long term (current) drug therapy: Secondary | ICD-10-CM | POA: Diagnosis not present

## 2016-10-21 DIAGNOSIS — I213 ST elevation (STEMI) myocardial infarction of unspecified site: Secondary | ICD-10-CM | POA: Diagnosis not present

## 2016-10-21 DIAGNOSIS — Z7982 Long term (current) use of aspirin: Secondary | ICD-10-CM | POA: Diagnosis not present

## 2016-10-26 ENCOUNTER — Encounter (HOSPITAL_COMMUNITY)
Admission: RE | Admit: 2016-10-26 | Discharge: 2016-10-26 | Disposition: A | Discharge status: Home / Self Care | Payer: 59 | Source: Ambulatory Visit | Attending: Cardiovascular Disease | Admitting: Cardiovascular Disease

## 2016-10-26 ENCOUNTER — Encounter (HOSPITAL_COMMUNITY): Payer: 59

## 2016-10-26 DIAGNOSIS — Z79899 Other long term (current) drug therapy: Secondary | ICD-10-CM | POA: Diagnosis not present

## 2016-10-26 DIAGNOSIS — I213 ST elevation (STEMI) myocardial infarction of unspecified site: Secondary | ICD-10-CM

## 2016-10-26 DIAGNOSIS — Z951 Presence of aortocoronary bypass graft: Secondary | ICD-10-CM

## 2016-10-26 DIAGNOSIS — Z7982 Long term (current) use of aspirin: Secondary | ICD-10-CM | POA: Diagnosis not present

## 2016-10-27 ENCOUNTER — Other Ambulatory Visit: Payer: Self-pay | Admitting: Physician Assistant

## 2016-10-28 ENCOUNTER — Encounter (HOSPITAL_COMMUNITY): Payer: 59

## 2016-10-28 ENCOUNTER — Encounter (HOSPITAL_COMMUNITY)
Admission: RE | Admit: 2016-10-28 | Discharge: 2016-10-28 | Disposition: A | Discharge status: Home / Self Care | Payer: 59 | Source: Ambulatory Visit | Attending: Cardiovascular Disease | Admitting: Cardiovascular Disease

## 2016-10-28 DIAGNOSIS — I213 ST elevation (STEMI) myocardial infarction of unspecified site: Secondary | ICD-10-CM | POA: Diagnosis not present

## 2016-10-28 DIAGNOSIS — Z7982 Long term (current) use of aspirin: Secondary | ICD-10-CM | POA: Diagnosis not present

## 2016-10-28 DIAGNOSIS — Z79899 Other long term (current) drug therapy: Secondary | ICD-10-CM | POA: Diagnosis not present

## 2016-10-28 DIAGNOSIS — Z951 Presence of aortocoronary bypass graft: Secondary | ICD-10-CM | POA: Diagnosis not present

## 2016-10-29 ENCOUNTER — Other Ambulatory Visit: Payer: Self-pay

## 2016-10-29 MED ORDER — ATORVASTATIN CALCIUM 80 MG PO TABS: 80.0000 mg | ORAL_TABLET | Freq: Every day | ORAL | 8 refills | Status: DC

## 2016-10-29 MED FILL — ATORVASTATIN 80 MG TABLET: 80 | 90 days supply | Qty: 90 | Fill #0

## 2016-10-30 ENCOUNTER — Encounter (HOSPITAL_COMMUNITY)
Admission: RE | Admit: 2016-10-30 | Discharge: 2016-10-30 | Disposition: A | Discharge status: Home / Self Care | Payer: 59 | Source: Ambulatory Visit | Attending: Cardiovascular Disease | Admitting: Cardiovascular Disease

## 2016-10-30 ENCOUNTER — Encounter (HOSPITAL_COMMUNITY): Payer: 59

## 2016-10-30 ENCOUNTER — Telehealth: Payer: Self-pay | Admitting: Cardiovascular Disease

## 2016-10-30 DIAGNOSIS — Z951 Presence of aortocoronary bypass graft: Secondary | ICD-10-CM | POA: Insufficient documentation

## 2016-10-30 DIAGNOSIS — Z7982 Long term (current) use of aspirin: Secondary | ICD-10-CM | POA: Diagnosis not present

## 2016-10-30 DIAGNOSIS — Z79899 Other long term (current) drug therapy: Secondary | ICD-10-CM | POA: Insufficient documentation

## 2016-10-30 DIAGNOSIS — I213 ST elevation (STEMI) myocardial infarction of unspecified site: Secondary | ICD-10-CM | POA: Insufficient documentation

## 2016-10-30 NOTE — Telephone Encounter (Signed)
New message       *STAT* If patient is at the pharmacy, call can be transferred to refill team.   1. Which medications need to be refilled? (please list name of each medication and dose if known)  Atorvastatin  2. Which pharmacy/location (including street and city if local pharmacy) is medication to be sent to? [facility]  3. Do they need a 30 day or 90 day supply? 30 day-----pt is out of medication

## 2016-11-02 ENCOUNTER — Encounter (HOSPITAL_COMMUNITY): Payer: 59

## 2016-11-02 ENCOUNTER — Encounter (HOSPITAL_COMMUNITY)
Admission: RE | Admit: 2016-11-02 | Discharge: 2016-11-02 | Disposition: A | Discharge status: Home / Self Care | Payer: 59 | Source: Ambulatory Visit | Attending: Cardiovascular Disease | Admitting: Cardiovascular Disease

## 2016-11-02 VITALS — Ht 72.75 in | Wt 166.0 lb

## 2016-11-02 DIAGNOSIS — Z951 Presence of aortocoronary bypass graft: Secondary | ICD-10-CM | POA: Diagnosis not present

## 2016-11-02 DIAGNOSIS — I213 ST elevation (STEMI) myocardial infarction of unspecified site: Secondary | ICD-10-CM

## 2016-11-02 DIAGNOSIS — Z79899 Other long term (current) drug therapy: Secondary | ICD-10-CM | POA: Diagnosis not present

## 2016-11-02 DIAGNOSIS — Z7982 Long term (current) use of aspirin: Secondary | ICD-10-CM | POA: Diagnosis not present

## 2016-11-03 NOTE — Progress Notes (Signed)
Cardiac Individual Treatment Plan  Patient Details  Name: Benjamin Robbins MRN: [id number] Date of Birth: [birth year] Referring Provider:   Flowsheet Row CARDIAC REHAB PHASE II ORIENTATION from 08/18/2016 in [place]  Referring Provider  Quay Burow, MD      Initial Encounter Date:  Flowsheet Row CARDIAC REHAB PHASE II ORIENTATION from 08/18/2016 in [place]  Date  08/18/16  Referring Provider  Quay Burow, MD      Visit Diagnosis: ST elevation myocardial infarction (STEMI), unspecified artery ([place])  S/P CABG x 4  Patient's Home Medications on Admission:  Current Outpatient Prescriptions:  .  aspirin 81 MG tablet, Take 81 mg by mouth daily., Disp: , Rfl:  .  atorvastatin (LIPITOR) 80 MG tablet, Take 1 tablet (80 mg total) by mouth daily at 6 PM., Disp: 30 tablet, Rfl: 8 .  carvedilol (COREG) 12.5 MG tablet, Take 1.5 tablets (18.75 mg total) by mouth 2 (two) times daily., Disp: 270 tablet, Rfl: 3 .  esomeprazole (NEXIUM) 40 MG capsule, Take 40 mg by mouth daily as needed (reflux)., Disp: , Rfl:  .  lisinopril (PRINIVIL,ZESTRIL) 5 MG tablet, Take 1 tablet (5 mg total) by mouth daily., Disp: 90 tablet, Rfl: 3 .  Multiple Vitamin (MULTIVITAMINS PO), Take 1 tablet by mouth daily., Disp: , Rfl:  .  topiramate (TOPAMAX) 100 MG tablet, Take 100 mg by mouth at bedtime., Disp: , Rfl:  .  VITAMIN D, ERGOCALCIFEROL, PO, Take 5,000 Units by mouth daily., Disp: , Rfl:   Past Medical History: Past Medical History:  Diagnosis Date  . Cancer - prostate   . GERD (gastroesophageal reflux disease)   . Migraine     Tobacco Use: History  Smoking Status  . Never Smoker  Smokeless Tobacco  . Never Used    Labs: Recent Review Flowsheet Data    Labs for ITP Cardiac and Pulmonary Rehab Latest Ref Rng & Units 06/26/2016 06/26/2016 06/26/2016 06/26/2016 09/01/2016   Cholestrol 125 - 200 mg/dL - - - - 93(L)   LDLCALC <130  mg/dL - - - - 53   HDL >=40 mg/dL - - - - 27(L)   Trlycerides <150 mg/dL - - - - 64   Hemoglobin A1c 4.8 - 5.6 % - - - - -   PHART 7.350 - 7.450 - 7.350 7.323(L) - -   PCO2ART 35.0 - 45.0 mmHg - 34.7(L) 35.9 - -   HCO3 20.0 - 24.0 mEq/L - 19.0(L) 18.6(L) - -   TCO2 0 - 100 mmol/L '21 20 20 21 ' -   ACIDBASEDEF 0.0 - 2.0 mmol/L - 6.0(H) 7.0(H) - -   O2SAT % - 99.0 98.0 - -      Capillary Blood Glucose: Lab Results  Component Value Date   GLUCAP 95 06/29/2016   GLUCAP 104 (H) 06/28/2016   GLUCAP 122 (H) 06/28/2016   GLUCAP 114 (H) 06/28/2016   GLUCAP 105 (H) 06/28/2016     Exercise Target Goals:    Exercise Program Goal: Individual exercise prescription set with THRR, safety & activity barriers. Participant demonstrates ability to understand and report RPE using BORG scale, to self-measure pulse accurately, and to acknowledge the importance of the exercise prescription.  Exercise Prescription Goal: Starting with aerobic activity 30 plus minutes a day, 3 days per week for initial exercise prescription. Provide home exercise prescription and guidelines that participant acknowledges understanding prior to discharge.  Activity Barriers & Risk Stratification:  Activity Barriers & Cardiac Risk Stratification - 08/18/16 1406      Activity Barriers & Cardiac Risk Stratification   Activity Barriers None   Cardiac Risk Stratification High      6 Minute Walk:     6 Minute Walk    Row Name 08/18/16 1420 08/18/16 1629       6 Minute Walk   Phase  - Initial    Distance  - 1421 feet    Walk Time 6 minutes 6 minutes    MPH  - 2.69    METS  - 3.88    RPE 11 11    VO2 Peak  - 13.59    Symptoms  - No    Resting HR 80 bpm  -    Resting BP 104/60  -    Max Ex. HR 89 bpm  -    Max Ex. BP 124/70  -    2 Minute Post BP 124/70  -       Initial Exercise Prescription:     Initial Exercise Prescription - 08/18/16 1600      Date of Initial Exercise RX and Referring Provider    Date 08/18/16   Referring Provider Quay Burow, MD     Treadmill   MPH 2.5   Grade 1   Minutes 10   METs 3.26     Bike   Level 1.2   Minutes 10   METs 3.93     NuStep   Level 3   Minutes 10   METs 2.5     Prescription Details   Frequency (times per week) 3   Duration Progress to 30 minutes of continuous aerobic without signs/symptoms of physical distress     Intensity   THRR 40-80% of Max Heartrate 64-128   Ratings of Perceived Exertion 11-13   Perceived Dyspnea 0-4     Progression   Progression Continue to progress workloads to maintain intensity without signs/symptoms of physical distress.     Resistance Training   Training Prescription Yes   Weight 2lbs   Reps 10-12      Perform Capillary Blood Glucose checks as needed.  Exercise Prescription Changes:      Exercise Prescription Changes    Row Name 09/09/16 1100 09/25/16 1500 10/05/16 1100 11/04/16 1300       Exercise Review   Progression Yes Yes Yes Yes      Response to Exercise   Blood Pressure (Admit) 130/64 128/86 128/84 138/76    Blood Pressure (Exercise) 142/72 150/76 140/66 138/70    Blood Pressure (Exit) 112/72 126/76 114/70 120/74    Heart Rate (Admit) 75 bpm 79 bpm 82 bpm 70 bpm    Heart Rate (Exercise) 130 bpm 127 bpm 126 bpm 123 bpm    Heart Rate (Exit) 75 bpm 82 bpm 86 bpm 80 bpm    Rating of Perceived Exertion (Exercise) '12 13 13 13    ' Comments Reviewed HEP on 08/28/16 Reviewed HEP on 08/28/16 Reviewed HEP on 08/28/16 Reviewed HEP on 08/28/16    Duration  -  -  - Progress to 45 minutes of aerobic exercise without signs/symptoms of physical distress    Intensity  -  -  - THRR unchanged      Progression   Progression  -  -  - Continue to progress workloads to maintain intensity without signs/symptoms of physical distress.    Average METs 4.2 5.1 6.2 6      Resistance Training  Training Prescription Yes Yes Yes Yes    Weight 4lbs 4lbs 5lbs 5lbs    Reps 10-12 10-12 10-12 10-12       Treadmill   MPH 3 3.2 3.2 3.2    Grade '1 5 5 5    ' Minutes '10 10 10 10    ' METs 3.71 5.66 5.66 5.66      Bike   Level 1.2 1.5 2.5 2.5    Minutes '10 10 10 10    ' METs 3.93 4.64 7.12 7.29      NuStep   Level '3 5 5 5    ' Minutes '10 10 10 10    ' METs 4.9 5.1 5.9 5.1      Home Exercise Plan   Plans to continue exercise at Home  reviewed HEP on 08/28/16 Home  reviewed HEP on 08/28/16 Home  reviewed HEP on 08/28/16 Home  reviewed HEP on 08/28/16    Frequency Add 2 additional days to program exercise sessions. Add 2 additional days to program exercise sessions. Add 2 additional days to program exercise sessions. Add 2 additional days to program exercise sessions.       Exercise Comments:      Exercise Comments    Row Name 09/23/16 1817 10/05/16 1149 11/04/16 1341       Exercise Comments Reviewed METs and goals. Pt is tolerating exercise well; will continue to monitor exercise progression. Reviewed METs. Pt is tolerating exercise well; will continue to monitor exercise progression. Reviewed METs and goals with pt.  Pt continues to progress with exercise        Discharge Exercise Prescription (Final Exercise Prescription Changes):     Exercise Prescription Changes - 11/04/16 1300      Exercise Review   Progression Yes     Response to Exercise   Blood Pressure (Admit) 138/76   Blood Pressure (Exercise) 138/70   Blood Pressure (Exit) 120/74   Heart Rate (Admit) 70 bpm   Heart Rate (Exercise) 123 bpm   Heart Rate (Exit) 80 bpm   Rating of Perceived Exertion (Exercise) 13   Comments Reviewed HEP on 08/28/16   Duration Progress to 45 minutes of aerobic exercise without signs/symptoms of physical distress   Intensity THRR unchanged     Progression   Progression Continue to progress workloads to maintain intensity without signs/symptoms of physical distress.   Average METs 6     Resistance Training   Training Prescription Yes   Weight 5lbs   Reps 10-12     Treadmill   MPH  3.2   Grade 5   Minutes 10   METs 5.66     Bike   Level 2.5   Minutes 10   METs 7.29     NuStep   Level 5   Minutes 10   METs 5.1     Home Exercise Plan   Plans to continue exercise at Home  reviewed HEP on 08/28/16   Frequency Add 2 additional days to program exercise sessions.      Nutrition:  Target Goals: Understanding of nutrition guidelines, daily intake of sodium <1541m, cholesterol <2087m calories 30% from fat and 7% or less from saturated fats, daily to have 5 or more servings of fruits and vegetables.  Biometrics:     Pre Biometrics - 08/18/16 1640      Pre Biometrics   Height 6' 0.75" (1.848 m)   Weight 169 lb 15.6 oz (77.1 kg)   Waist Circumference 34 inches   Hip  Circumference 38 inches   Waist to Hip Ratio 0.89 %   BMI (Calculated) 22.6   Triceps Skinfold 13 mm   % Body Fat 21.7 %   Grip Strength 37.5 kg   Flexibility 9 in   Single Leg Stand 30 seconds       Nutrition Therapy Plan and Nutrition Goals:   Nutrition Discharge: Nutrition Scores:     Nutrition Assessments - 09/23/16 1114      MEDFICTS Scores   Pre Score 83      Nutrition Goals Re-Evaluation:   Psychosocial: Target Goals: Acknowledge presence or absence of depression, maximize coping skills, provide positive support system. Participant is able to verbalize types and ability to use techniques and skills needed for reducing stress and depression.  Initial Review & Psychosocial Screening:     Initial Psych Review & Screening - 08/18/16 [place]? Yes     Barriers   Psychosocial barriers to participate in program There are no identifiable barriers or psychosocial needs.     Screening Interventions   Interventions Encouraged to exercise  no psychosocial interventions necessary       Quality of Life Scores:     Quality of Life - 10/07/16 1751      Quality of Life Scores   Health/Function Pre --  Benjamin Robbins is feeling better.  Shortness of breath and energy are improving.   Socioeconomic Pre --  Benjamin Robbins relies more on support from his family than friends.   Psych/Spiritual Pre --  Feels better spiritually   GLOBAL Pre --  Reviewed quality of life with [facility] [employer]. Will forward to Dr  Kennon Holter office  for review.      PHQ-9: Recent Review Flowsheet Data    Depression screen [facility] 2/9 08/24/2016   Decreased Interest 0   Down, Depressed, Hopeless 0   PHQ - 2 Score 0      Psychosocial Evaluation and Intervention:     Psychosocial Evaluation - 09/07/16 1648      Psychosocial Evaluation & Interventions   Interventions Encouraged to exercise with the program and follow exercise prescription   Comments no psychosocial neds idenified, no interventions necessary, pt states he feels the program is helping him regain strength and stamina.  he is planning to return to work in 2 weeks.    Continued Psychosocial Services Needed No      Psychosocial Re-Evaluation:     Psychosocial Re-Evaluation    [place] Name 10/06/16 1153 11/03/16 1620           Psychosocial Re-Evaluation   Interventions Encouraged to attend Cardiac Rehabilitation for the exercise Encouraged to attend Cardiac Rehabilitation for the exercise      Comments No needs identifed at this time.  -      Continued Psychosocial Services Needed No No         Vocational Rehabilitation: Provide vocational rehab assistance to qualifying candidates.   Vocational Rehab Evaluation & Intervention:     Vocational Rehab - 08/18/16 1606      Initial Vocational Rehab Evaluation & Intervention   Assessment shows need for Vocational Rehabilitation No      Education: Education Goals: Education classes will be provided on a weekly basis, covering required topics. Participant will state understanding/return demonstration of topics presented.  Learning Barriers/Preferences:     Learning Barriers/Preferences - 08/18/16 1605      Learning Barriers/Preferences    Learning Barriers Sight  glasses  Learning Preferences Written Material;Verbal Instruction      Education Topics: Count Your Pulse:  -Group instruction provided by verbal instruction, demonstration, patient participation and written materials to support subject.  Instructors address importance of being able to find your pulse and how to count your pulse when at home without a heart monitor.  Patients get hands on experience counting their pulse with staff help and individually.   Heart Attack, Angina, and Risk Factor Modification:  -Group instruction provided by verbal instruction, video, and written materials to support subject.  Instructors address signs and symptoms of angina and heart attacks.    Also discuss risk factors for heart disease and how to make changes to improve heart health risk factors. Flowsheet Row CARDIAC REHAB PHASE II EXERCISE from 09/18/2016 in [place]  Date  09/16/16  Instruction Review Code  2- meets goals/outcomes      Functional Fitness:  -Group instruction provided by verbal instruction, demonstration, patient participation, and written materials to support subject.  Instructors address safety measures for doing things around the house.  Discuss how to get up and down off the floor, how to pick things up properly, how to safely get out of a chair without assistance, and balance training. Flowsheet Row CARDIAC REHAB PHASE II EXERCISE from 09/18/2016 in [place]  Date  09/18/16  Instruction Review Code  2- meets goals/outcomes      Meditation and Mindfulness:  -Group instruction provided by verbal instruction, patient participation, and written materials to support subject.  Instructor addresses importance of mindfulness and meditation practice to help reduce stress and improve awareness.  Instructor also leads participants through a meditation exercise.    Stretching for Flexibility and  Mobility:  -Group instruction provided by verbal instruction, patient participation, and written materials to support subject.  Instructors lead participants through series of stretches that are designed to increase flexibility thus improving mobility.  These stretches are additional exercise for major muscle groups that are typically performed during regular warm up and cool down. Flowsheet Row CARDIAC REHAB PHASE II EXERCISE from 09/18/2016 in [place]  Date  08/28/16  Instruction Review Code  2- meets goals/outcomes      Hands Only CPR Anytime:  -Group instruction provided by verbal instruction, video, patient participation and written materials to support subject.  Instructors co-teach with [facility] video for hands only CPR.  Participants get hands on experience with mannequins.   Nutrition I class: Heart Healthy Eating:  -Group instruction provided by PowerPoint slides, verbal discussion, and written materials to support subject matter. The instructor gives an explanation and review of the Therapeutic Lifestyle Changes diet recommendations, which includes a discussion on lipid goals, dietary fat, sodium, fiber, plant stanol/sterol esters, sugar, and the components of a well-balanced, healthy diet. Flowsheet Row CARDIAC REHAB PHASE II EXERCISE from 09/18/2016 in [place]  Date  09/01/16  Educator  RD  Instruction Review Code  2- meets goals/outcomes      Nutrition II class: Lifestyle Skills:  -Group instruction provided by PowerPoint slides, verbal discussion, and written materials to support subject matter. The instructor gives an explanation and review of label reading, grocery shopping for heart health, heart healthy recipe modifications, and ways to make healthier choices when eating out. Flowsheet Row CARDIAC REHAB PHASE II EXERCISE from 09/18/2016 in [place]  Date  09/08/16  Educator   RD  Instruction Review  Code  2- meets goals/outcomes      Diabetes Question & Answer:  -Group instruction provided by PowerPoint slides, verbal discussion, and written materials to support subject matter. The instructor gives an explanation and review of diabetes co-morbidities, pre- and post-prandial blood glucose goals, pre-exercise blood glucose goals, signs, symptoms, and treatment of hypoglycemia and hyperglycemia, and foot care basics. Flowsheet Row CARDIAC REHAB PHASE II EXERCISE from 09/18/2016 in [place]  Date  09/11/16  Educator  RD  Instruction Review Code  2- meets goals/outcomes      Diabetes Blitz:  -Group instruction provided by PowerPoint slides, verbal discussion, and written materials to support subject matter. The instructor gives an explanation and review of the physiology behind type 1 and type 2 diabetes, diabetes medications and rational behind using different medications, pre- and post-prandial blood glucose recommendations and Hemoglobin A1c goals, diabetes diet, and exercise including blood glucose guidelines for exercising safely.    Portion Distortion:  -Group instruction provided by PowerPoint slides, verbal discussion, written materials, and food models to support subject matter. The instructor gives an explanation of serving size versus portion size, changes in portions sizes over the last 20 years, and what consists of a serving from each food group. Flowsheet Row CARDIAC REHAB PHASE II EXERCISE from 09/18/2016 in [place]  Date  09/02/16  Educator  RD  Instruction Review Code  2- meets goals/outcomes      Stress Management:  -Group instruction provided by verbal instruction, video, and written materials to support subject matter.  Instructors review role of stress in heart disease and how to cope with stress positively.   Flowsheet Row CARDIAC REHAB PHASE II EXERCISE from 09/18/2016 in  [place]  Date  08/26/16  Instruction Review Code  2- meets goals/outcomes      Exercising on Your Own:  -Group instruction provided by verbal instruction, power point, and written materials to support subject.  Instructors discuss benefits of exercise, components of exercise, frequency and intensity of exercise, and end points for exercise.  Also discuss use of nitroglycerin and activating EMS.  Review options of places to exercise outside of rehab.  Review guidelines for sex with heart disease.   Cardiac Drugs I:  -Group instruction provided by verbal instruction and written materials to support subject.  Instructor reviews cardiac drug classes: antiplatelets, anticoagulants, beta blockers, and statins.  Instructor discusses reasons, side effects, and lifestyle considerations for each drug class. Flowsheet Row CARDIAC REHAB PHASE II EXERCISE from 09/18/2016 in [place]  Date  09/09/16  Instruction Review Code  2- meets goals/outcomes      Cardiac Drugs II:  -Group instruction provided by verbal instruction and written materials to support subject.  Instructor reviews cardiac drug classes: angiotensin converting enzyme inhibitors (ACE-I), angiotensin II receptor blockers (ARBs), nitrates, and calcium channel blockers.  Instructor discusses reasons, side effects, and lifestyle considerations for each drug class.   Anatomy and Physiology of the Circulatory System:  -Group instruction provided by verbal instruction, video, and written materials to support subject.  Reviews functional anatomy of heart, how it relates to various diagnoses, and what role the heart plays in the overall system.   Knowledge Questionnaire Score:     Knowledge Questionnaire Score - 08/18/16 1606      Knowledge Questionnaire Score   Pre Score 23/24      Core Components/Risk Factors/Patient Goals at Admission:  Personal Goals and Risk  Factors at Admission - 08/18/16 1507      Core Components/Risk Factors/Patient Goals on Admission   Intervention Provide education on lifestyle modifcations including regular physical activity/exercise, weight management, moderate sodium restriction and increased consumption of fresh fruit, vegetables, and low fat dairy, alcohol moderation, and smoking cessation.;Monitor prescription use compliance.   Expected Outcomes Short Term: Continued assessment and intervention until BP is < 140/15m HG in hypertensive participants. < 130/838mHG in hypertensive participants with diabetes, heart failure or chronic kidney disease.;Long Term: Maintenance of blood pressure at goal levels.   Lipids Yes   Intervention Provide education and support for participant on nutrition & aerobic/resistive exercise along with prescribed medications to achieve LDL <7041mHDL >88m85m Expected Outcomes Short Term: Participant states understanding of desired cholesterol values and is compliant with medications prescribed. Participant is following exercise prescription and nutrition guidelines.;Long Term: Cholesterol controlled with medications as prescribed, with individualized exercise RX and with personalized nutrition plan. Value goals: LDL < 70mg48mL > 40 mg.   Stress Yes   Intervention Offer individual and/or small group education and counseling on adjustment to heart disease, stress management and health-related lifestyle change. Teach and support self-help strategies.;Refer participants experiencing significant psychosocial distress to appropriate mental health specialists for further evaluation and treatment. When possible, include family members and significant others in education/counseling sessions.   Expected Outcomes Short Term: Participant demonstrates changes in health-related behavior, relaxation and other stress management skills, ability to obtain effective social support, and compliance with psychotropic  medications if prescribed.;Long Term: Emotional wellbeing is indicated by absence of clinically significant psychosocial distress or social isolation.   Personal Goal Other Yes   Personal Goal Increase energy level. Feel better.   Intervention Develop an individualized exercise prescription for aerobic and resistive training based on initial evaluation findings, risk stratification, comorbidities and participant's personal goals.   Expected Outcomes Achievement of increased cardiorespiraory fitness and enhanced flexibility, muscular endurance and strength shown through meazurements of functional capacity and personal statement of participant.      Core Components/Risk Factors/Patient Goals Review:      Goals and Risk Factor Review    Row Name 09/23/16 1817 11/04/16 1341           Core Components/Risk Factors/Patient Goals Review   Personal Goals Review Increase Strength and Stamina Increase Strength and Stamina      Review Pt has noticed an improvement and energy and stamina. Pt has returned to work and can work up to 5 hours without fatigue. Pt finds cardiac rehab to be helpful/beneficial Pt states he feels better overall and he has more energy, although he is still SOB after exertion sometimes      Expected Outcomes Pt will continue to decrease symptoms of fatigue with increase activity levels. Continue exercise routine in order to increase cardiovascular fitness         Core Components/Risk Factors/Patient Goals at Discharge (Final Review):      Goals and Risk Factor Review - 11/04/16 1341      Core Components/Risk Factors/Patient Goals Review   Personal Goals Review Increase Strength and Stamina   Review Pt states he feels better overall and he has more energy, although he is still SOB after exertion sometimes   Expected Outcomes Continue exercise routine in order to increase cardiovascular fitness      ITP Comments:     ITP Comments    Row Name 08/18/16 1336 09/04/16 1348  ITP Comments Medical Director- Dr. Fransico Him, MD Patient attended the "Taking the high out of high blood pressure" video/lecture education class on 09/04/16. Met outcomes/goals.         Comments: Tevis is making expected progress toward personal goals after completing 31sessions. Recommend continued exercise and life style modification education including  stress management and relaxation techniques to decrease cardiac risk profile. [name], RN,BSN 11/05/2016 12:01 PM

## 2016-11-04 ENCOUNTER — Encounter (HOSPITAL_COMMUNITY): Payer: 59

## 2016-11-04 ENCOUNTER — Encounter (HOSPITAL_COMMUNITY)
Admission: RE | Admit: 2016-11-04 | Discharge: 2016-11-04 | Disposition: A | Discharge status: Home / Self Care | Payer: 59 | Source: Ambulatory Visit | Attending: Cardiovascular Disease | Admitting: Cardiovascular Disease

## 2016-11-04 DIAGNOSIS — Z951 Presence of aortocoronary bypass graft: Secondary | ICD-10-CM

## 2016-11-04 DIAGNOSIS — Z7982 Long term (current) use of aspirin: Secondary | ICD-10-CM | POA: Diagnosis not present

## 2016-11-04 DIAGNOSIS — I213 ST elevation (STEMI) myocardial infarction of unspecified site: Secondary | ICD-10-CM | POA: Diagnosis not present

## 2016-11-04 DIAGNOSIS — Z79899 Other long term (current) drug therapy: Secondary | ICD-10-CM | POA: Diagnosis not present

## 2016-11-06 ENCOUNTER — Encounter (HOSPITAL_COMMUNITY): Payer: 59

## 2016-11-06 ENCOUNTER — Encounter (HOSPITAL_COMMUNITY)
Admission: RE | Admit: 2016-11-06 | Discharge: 2016-11-06 | Disposition: A | Discharge status: Home / Self Care | Payer: 59 | Source: Ambulatory Visit | Attending: Cardiovascular Disease | Admitting: Cardiovascular Disease

## 2016-11-06 DIAGNOSIS — Z951 Presence of aortocoronary bypass graft: Secondary | ICD-10-CM | POA: Diagnosis not present

## 2016-11-06 DIAGNOSIS — I213 ST elevation (STEMI) myocardial infarction of unspecified site: Secondary | ICD-10-CM

## 2016-11-06 DIAGNOSIS — Z79899 Other long term (current) drug therapy: Secondary | ICD-10-CM | POA: Diagnosis not present

## 2016-11-06 DIAGNOSIS — Z7982 Long term (current) use of aspirin: Secondary | ICD-10-CM | POA: Diagnosis not present

## 2016-11-09 ENCOUNTER — Encounter (HOSPITAL_COMMUNITY): Payer: 59

## 2016-11-09 ENCOUNTER — Encounter (HOSPITAL_COMMUNITY)
Admission: RE | Admit: 2016-11-09 | Discharge: 2016-11-09 | Disposition: A | Discharge status: Home / Self Care | Payer: 59 | Source: Ambulatory Visit | Attending: Cardiovascular Disease | Admitting: Cardiovascular Disease

## 2016-11-09 DIAGNOSIS — I213 ST elevation (STEMI) myocardial infarction of unspecified site: Secondary | ICD-10-CM | POA: Diagnosis not present

## 2016-11-09 DIAGNOSIS — Z79899 Other long term (current) drug therapy: Secondary | ICD-10-CM | POA: Diagnosis not present

## 2016-11-09 DIAGNOSIS — Z7982 Long term (current) use of aspirin: Secondary | ICD-10-CM | POA: Diagnosis not present

## 2016-11-09 DIAGNOSIS — Z951 Presence of aortocoronary bypass graft: Secondary | ICD-10-CM | POA: Diagnosis not present

## 2016-11-11 ENCOUNTER — Encounter (HOSPITAL_COMMUNITY): Payer: 59

## 2016-11-11 ENCOUNTER — Encounter (HOSPITAL_COMMUNITY)
Admission: RE | Admit: 2016-11-11 | Discharge: 2016-11-11 | Disposition: A | Discharge status: Home / Self Care | Payer: 59 | Source: Ambulatory Visit | Attending: Cardiovascular Disease | Admitting: Cardiovascular Disease

## 2016-11-11 DIAGNOSIS — I213 ST elevation (STEMI) myocardial infarction of unspecified site: Secondary | ICD-10-CM

## 2016-11-11 DIAGNOSIS — Z951 Presence of aortocoronary bypass graft: Secondary | ICD-10-CM

## 2016-11-11 DIAGNOSIS — Z7982 Long term (current) use of aspirin: Secondary | ICD-10-CM | POA: Diagnosis not present

## 2016-11-11 DIAGNOSIS — Z79899 Other long term (current) drug therapy: Secondary | ICD-10-CM | POA: Diagnosis not present

## 2016-11-13 ENCOUNTER — Encounter (HOSPITAL_COMMUNITY): Payer: 59

## 2016-11-13 ENCOUNTER — Encounter (HOSPITAL_COMMUNITY)
Admission: RE | Admit: 2016-11-13 | Discharge: 2016-11-13 | Disposition: A | Discharge status: Home / Self Care | Payer: 59 | Source: Ambulatory Visit | Attending: Cardiovascular Disease | Admitting: Cardiovascular Disease

## 2016-11-13 DIAGNOSIS — Z951 Presence of aortocoronary bypass graft: Secondary | ICD-10-CM

## 2016-11-13 DIAGNOSIS — Z7982 Long term (current) use of aspirin: Secondary | ICD-10-CM | POA: Diagnosis not present

## 2016-11-13 DIAGNOSIS — Z79899 Other long term (current) drug therapy: Secondary | ICD-10-CM | POA: Diagnosis not present

## 2016-11-13 DIAGNOSIS — I213 ST elevation (STEMI) myocardial infarction of unspecified site: Secondary | ICD-10-CM | POA: Diagnosis not present

## 2016-11-13 NOTE — Progress Notes (Signed)
Discharge Summary  Patient Details  Name: Benjamin Robbins MRN: [id number] Date of Birth: 1956/11/12 Referring Provider:   Flowsheet Row CARDIAC REHAB PHASE II ORIENTATION from 08/18/2016 in [facility]  Referring Provider  Quay Burow, MD       Number of Visits: 36  Reason for Discharge:  Patient independent in their exercise.  Smoking History:  History  Smoking Status  . Never Smoker  Smokeless Tobacco  . Never Used    Diagnosis:  ST elevation myocardial infarction (STEMI), unspecified artery (HCC)  S/P CABG x 4  ADL UCSD:   Initial Exercise Prescription:     Initial Exercise Prescription - 08/18/16 1600      Date of Initial Exercise RX and Referring Provider   Date 08/18/16   Referring Provider Quay Burow, MD     Treadmill   MPH 2.5   Grade 1   Minutes 10   METs 3.26     Bike   Level 1.2   Minutes 10   METs 3.93     NuStep   Level 3   Minutes 10   METs 2.5     Prescription Details   Frequency (times per week) 3   Duration Progress to 30 minutes of continuous aerobic without signs/symptoms of physical distress     Intensity   THRR 40-80% of Max Heartrate 64-128   Ratings of Perceived Exertion 11-13   Perceived Dyspnea 0-4     Progression   Progression Continue to progress workloads to maintain intensity without signs/symptoms of physical distress.     Resistance Training   Training Prescription Yes   Weight 2lbs   Reps 10-12      Discharge Exercise Prescription (Final Exercise Prescription Changes):     Exercise Prescription Changes - 11/27/16 0800      Exercise Review   Progression (P)  Yes     Response to Exercise   Blood Pressure (Admit) (P)  136/78   Blood Pressure (Exercise) (P)  152/88   Blood Pressure (Exit) (P)  110/60   Heart Rate (Admit) (P)  75 bpm   Heart Rate (Exercise) (P)  111 bpm   Heart Rate (Exit) (P)  75 bpm   Rating of Perceived Exertion (Exercise) (P)  10    Comments (P)  Reviewed HEP on 08/28/16   Duration (P)  Progress to 45 minutes of aerobic exercise without signs/symptoms of physical distress   Intensity (P)  THRR unchanged     Progression   Progression (P)  Continue to progress workloads to maintain intensity without signs/symptoms of physical distress.   Average METs (P)  7.2     Resistance Training   Training Prescription (P)  Yes   Weight (P)  5lbs   Reps (P)  10-12     Treadmill   MPH (P)  3.2   Grade (P)  5   Minutes (P)  10   METs (P)  5.66     Bike   Level (P)  2.5   Minutes (P)  10   METs (P)  7.29     NuStep   Level (P)  5   Minutes (P)  10   METs (P)  5.1     Home Exercise Plan   Plans to continue exercise at (P)  Home  reviewed HEP on 08/28/16   Frequency (P)  Add 2 additional days to program exercise sessions.      Functional Capacity:  [place]oll[place] Name 08/18/16 1420 08/18/16 1629 11/27/16 0831     6 Minute Walk   Phase  - Initial Discharge   Distance  - 1421 feet 1953 feet   Distance % Change  -  - 37 %   Walk Time 6 minutes 6 minutes 6 minutes   # of Rest Breaks  -  - 0   MPH  - 2.69 3.7   METS  - 3.88 5.3   RPE '11 11 10   ' VO2 Peak  - 13.59 18.6   Symptoms  - No No   Resting HR 80 bpm  - 75 bpm   Resting BP 104/60  - 136/78   Max Ex. HR 89 bpm  - 111 bpm   Max Ex. BP 124/70  - 152/88   2 Minute Post BP 124/70  - 142/88      Psychological, QOL, Others - Outcomes: PHQ 2/9: Depression screen [facility] 2/9 11/13/2016 08/24/2016  Decreased Interest 0 0  Down, Depressed, Hopeless 0 0  PHQ - 2 Score 0 0    Quality of Life:     Quality of Life - 12/03/16 0751      Quality of Life Scores   Health/Function Pre 17.87 %   Health/Function Post 22.17 %   Health/Function % Change 24.06 %   Socioeconomic Pre 17.71 %   Socioeconomic Post 22.5 %   Socioeconomic % Change  27.05 %   Psych/Spiritual Pre 16.93 %   Psych/Spiritual Post 22.5 %   Psych/Spiritual % Change 32.9 %   Family  Pre 24.3 %   Family Post 22.5 %   Family % Change -7.41 %   GLOBAL Pre 18.59 %   GLOBAL Post 22.36 %   GLOBAL % Change 20.28 %      Personal Goals: Goals established at orientation with interventions provided to work toward goal.     Personal Goals and Risk Factors at Admission - 08/18/16 1507      Core Components/Risk Factors/Patient Goals on Admission   Intervention Provide education on lifestyle modifcations including regular physical activity/exercise, weight management, moderate sodium restriction and increased consumption of fresh fruit, vegetables, and low fat dairy, alcohol moderation, and smoking cessation.;Monitor prescription use compliance.   Expected Outcomes Short Term: Continued assessment and intervention until BP is < 140/66m HG in hypertensive participants. < 130/846mHG in hypertensive participants with diabetes, heart failure or chronic kidney disease.;Long Term: Maintenance of blood pressure at goal levels.   Lipids Yes   Intervention Provide education and support for participant on nutrition & aerobic/resistive exercise along with prescribed medications to achieve LDL <7062mHDL >76m2m Expected Outcomes Short Term: Participant states understanding of desired cholesterol values and is compliant with medications prescribed. Participant is following exercise prescription and nutrition guidelines.;Long Term: Cholesterol controlled with medications as prescribed, with individualized exercise RX and with personalized nutrition plan. Value goals: LDL < 70mg46mL > 40 mg.   Stress Yes   Intervention Offer individual and/or small group education and counseling on adjustment to heart disease, stress management and health-related lifestyle change. Teach and support self-help strategies.;Refer participants experiencing significant psychosocial distress to appropriate mental health specialists for further evaluation and treatment. When possible, include family members and  significant others in education/counseling sessions.   Expected Outcomes Short Term: Participant demonstrates changes in health-related behavior, relaxation and other stress management skills, ability to obtain effective social support, and compliance with psychotropic medications if prescribed.;Long Term:  Emotional wellbeing is indicated by absence of clinically significant psychosocial distress or social isolation.   Personal Goal Other Yes   Personal Goal Increase energy level. Feel better.   Intervention Develop an individualized exercise prescription for aerobic and resistive training based on initial evaluation findings, risk stratification, comorbidities and participant's personal goals.   Expected Outcomes Achievement of increased cardiorespiraory fitness and enhanced flexibility, muscular endurance and strength shown through meazurements of functional capacity and personal statement of participant.       Personal Goals Discharge:     Goals and Risk Factor Review    Row Name 09/23/16 1817 11/04/16 1341 12/03/16 0747         Core Components/Risk Factors/Patient Goals Review   Personal Goals Review Increase Strength and Stamina Increase Strength and Stamina Increase Strength and Stamina     Review Pt has noticed an improvement and energy and stamina. Pt has returned to work and can work up to 5 hours without fatigue. Pt finds cardiac rehab to be helpful/beneficial Pt states he feels better overall and he has more energy, although he is still SOB after exertion sometimes Pt showed great progression with exericse.  His post walk test distance increased by 37% and the pt stated that he has noticed an improvement in his energy level compared to when he first started CRPII.  Pt plans to continue his exercise routine at [employer].       Expected Outcomes Pt will continue to decrease symptoms of fatigue with increase activity levels. Continue exercise routine in order to increase  cardiovascular fitness Continue exercise routine in order to increase cardiovascular fitness        Nutrition & Weight - Outcomes:     Pre Biometrics - 08/18/16 1640      Pre Biometrics   Height 6' 0.75" (1.848 m)   Weight 169 lb 15.6 oz (77.1 kg)   Waist Circumference 34 inches   Hip Circumference 38 inches   Waist to Hip Ratio 0.89 %   BMI (Calculated) 22.6   Triceps Skinfold 13 mm   % Body Fat 21.7 %   Grip Strength 37.5 kg   Flexibility 9 in   Single Leg Stand 30 seconds         Post Biometrics - 12/03/16 0750       Post  Biometrics   Height 6' 0.75" (1.848 m)   Weight 166 lb 0.1 oz (75.3 kg)   Waist Circumference 35.5 inches   Hip Circumference 40.25 inches   Waist to Hip Ratio 0.88 %   BMI (Calculated) 22.1   Triceps Skinfold 21 mm   % Body Fat 23.7 %   Grip Strength 46 kg   Flexibility 11.5 in   Single Leg Stand 30 seconds      Nutrition:   Nutrition Discharge:     Nutrition Assessments - 12/03/16 1006      MEDFICTS Scores   Pre Score 83   Post Score 21   Score Difference -62      Education Questionnaire Score:     Knowledge Questionnaire Score - 11/27/16 0831      Knowledge Questionnaire Score   Post Score 24/24      Goals reviewed with patient; copy given to patient. Saagar graduated from cardiac rehab program today with completion of 36 exercise sessions in Phase II. Pt maintained good attendance and progressed nicely during his participation in rehab as evidenced by increased MET level.   Medication list reconciled. Repeat  PHQ score- 0 .  Pt has made significant lifestyle changes and should be commended for his success. Pt feels he has achieved his goals during cardiac rehab.   Ermon plans to continue exercise at the gym here at [facility] as he is an employee here.[name], RN,BSN 12/04/2016 12:19 PM

## 2016-11-16 ENCOUNTER — Encounter (HOSPITAL_COMMUNITY): Payer: 59

## 2016-11-16 ENCOUNTER — Encounter (HOSPITAL_COMMUNITY): Admission: RE | Admit: 2016-11-16 | Payer: 59 | Source: Ambulatory Visit

## 2016-11-18 ENCOUNTER — Encounter (HOSPITAL_COMMUNITY): Payer: 59

## 2016-11-20 ENCOUNTER — Encounter (HOSPITAL_COMMUNITY): Payer: 59

## 2016-11-23 ENCOUNTER — Encounter (HOSPITAL_COMMUNITY): Payer: 59

## 2016-11-25 ENCOUNTER — Encounter (HOSPITAL_COMMUNITY): Payer: 59

## 2016-11-25 MED FILL — LISINOPRIL 5 MG TABLET: 5 | 90 days supply | Qty: 90 | Fill #1

## 2016-11-25 MED FILL — TOPIRAMATE 100 MG TABLET: 100 | 90 days supply | Qty: 90 | Fill #2

## 2016-11-27 ENCOUNTER — Encounter (HOSPITAL_COMMUNITY): Payer: 59

## 2016-12-02 ENCOUNTER — Encounter (HOSPITAL_COMMUNITY): Payer: 59

## 2016-12-02 DIAGNOSIS — J069 Acute upper respiratory infection, unspecified: Secondary | ICD-10-CM | POA: Diagnosis not present

## 2016-12-02 MED FILL — HYDROCODONE-HOMATROPINE SOL: 5-1.5 | 5 days supply | Qty: 100 | Fill #0

## 2016-12-02 MED FILL — IPRATROPIUM 0.03% SPRAY: 0.03 | 30 days supply | Qty: 30 | Fill #0

## 2016-12-02 MED FILL — BENZONATATE 200 MG CAPSULE: 200 | 10 days supply | Qty: 30 | Fill #0

## 2016-12-03 NOTE — Addendum Note (Signed)
Encounter addended by: Jewel Baize, RD on: 12/03/2016 10:09 AM<BR>    Actions taken: Flowsheet data copied forward, Visit Navigator Flowsheet section accepted

## 2016-12-03 NOTE — Addendum Note (Signed)
Encounter addended by: Meta Hatchet on: 12/03/2016  7:58 AM<BR>    Actions taken: Flowsheet data copied forward, Visit Navigator Flowsheet section accepted

## 2016-12-04 ENCOUNTER — Encounter (HOSPITAL_COMMUNITY): Payer: 59

## 2016-12-07 ENCOUNTER — Encounter (HOSPITAL_COMMUNITY): Payer: 59

## 2016-12-09 ENCOUNTER — Encounter (HOSPITAL_COMMUNITY): Payer: 59

## 2016-12-11 ENCOUNTER — Encounter (HOSPITAL_COMMUNITY): Payer: 59

## 2016-12-14 ENCOUNTER — Encounter (HOSPITAL_COMMUNITY): Payer: 59

## 2016-12-22 MED FILL — CARVEDILOL 12.5 MG TABLET: 12.5 | 90 days supply | Qty: 270 | Fill #1

## 2017-02-01 MED FILL — ATORVASTATIN 80 MG TABLET: 80 | 90 days supply | Qty: 90 | Fill #1

## 2017-02-23 ENCOUNTER — Encounter: Payer: Self-pay | Admitting: Cardiovascular Disease

## 2017-02-23 ENCOUNTER — Ambulatory Visit (INDEPENDENT_AMBULATORY_CARE_PROVIDER_SITE_OTHER): Payer: 59 | Admitting: Cardiovascular Disease

## 2017-02-23 VITALS — BP 128/72 | HR 73 | Ht 72.0 in | Wt 170.0 lb

## 2017-02-23 DIAGNOSIS — E785 Hyperlipidemia, unspecified: Secondary | ICD-10-CM | POA: Diagnosis not present

## 2017-02-23 DIAGNOSIS — I2109 ST elevation (STEMI) myocardial infarction involving other coronary artery of anterior wall: Secondary | ICD-10-CM | POA: Diagnosis not present

## 2017-02-23 DIAGNOSIS — I519 Heart disease, unspecified: Secondary | ICD-10-CM | POA: Diagnosis not present

## 2017-02-23 DIAGNOSIS — I255 Ischemic cardiomyopathy: Secondary | ICD-10-CM

## 2017-02-23 MED ORDER — CARVEDILOL 25 MG PO TABS: 25.0000 mg | ORAL_TABLET | Freq: Two times a day (BID) | ORAL | 2 refills | Status: DC

## 2017-02-23 MED FILL — CARVEDILOL 25 MG TABLET: 25 | 90 days supply | Qty: 180 | Fill #0

## 2017-02-23 MED FILL — LISINOPRIL 5 MG TABLET: 5 | 90 days supply | Qty: 90 | Fill #2

## 2017-02-23 MED FILL — TOPIRAMATE 100 MG TABLET: 100 | 90 days supply | Qty: 90 | Fill #3

## 2017-02-23 NOTE — Progress Notes (Signed)
02/23/2017 Benjamin Robbins   1955-12-27  [id number]  Primary Physician Melinda Crutch, MD Primary Cardiologist: Lorretta Harp MD Renae Gloss  HPI:   Mr. Desa is a 61 year old thin-appearing married Caucasian male father of 87, grandfather 38 grandchildren was accompanied by his wife today. He has no  prior cardiac history or risk factors other than premature family history. I last saw him in the office 09/01/16. He has had no prior chest pain until 5:30 the afternoon of 06/25/16. 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 He developed substernal chest pain and was brought urgently to the emergency room where an EKG showed anterior ST segment elevation with reciprocal inferior depression. He was treated with aspirin and IV nitroglycerin was brought emergently to the Cath Lab where I performed cardiac catheterization revealing left main/three-vessel disease with moderately severe LV dysfunction (EF 25-30%). He underwent emergent coronary artery bypass grafting X 4 by Dr. Cyndia Bent with a LIMA to the LAD, vein to obtuse marginal branch and sequential vein to the PDA and PLA. He was discharged home on 06/30/16. He has been doing well and recuperating nicely. He finished a 12 week, outpatient cardiac rehabilitation program. He currently is a symptomatic. He did have a 2-D echocardiogram performed 10/13/16 revealing an ejection fraction of 40-45%, moderately improved compared to his EF at the time of his STEMI which was 25-30%.   Current Outpatient Prescriptions  Medication Sig Dispense Refill  . aspirin 81 MG tablet Take 81 mg by mouth daily.    [name] atorvastatin (LIPITOR) 80 MG tablet Take 1 tablet (80 mg total) by mouth daily at 6 PM. 30 tablet 8  . esomeprazole (NEXIUM) 40 MG capsule Take 40 mg by mouth daily as needed (reflux).    [name] lisinopril (PRINIVIL,ZESTRIL) 5 MG tablet Take 1 tablet (5 mg total) by mouth daily. 90 tablet 3  . Multiple Vitamin (MULTIVITAMINS PO) Take 1  tablet by mouth daily.    [name] topiramate (TOPAMAX) 100 MG tablet Take 100 mg by mouth at bedtime.    [name] VITAMIN D, ERGOCALCIFEROL, PO Take 5,000 Units by mouth daily.    . carvedilol (COREG) 25 MG tablet Take 1 tablet (25 mg total) by mouth 2 (two) times daily. 60 tablet 2   No current facility-administered medications for this visit.     No Known Allergies  Social History   Social History  . Marital status: Married    Spouse name: [name]  . Number of children: 3  . Years of education: [name]   Occupational History  . 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 Smoking status: Never Smoker  . Smokeless tobacco: Never Used  . Alcohol use No  . Drug use: No  . Sexual activity: Not on file   Other Topics Concern  . Not on file   Social History Narrative   Daily caffeine     Review of Systems: General: negative for chills, fever, night sweats or weight changes.  Cardiovascular: negative for chest pain, dyspnea on exertion, edema, orthopnea, palpitations, paroxysmal nocturnal dyspnea or shortness of breath Dermatological: negative for rash Respiratory: negative for cough or wheezing Urologic: negative for hematuria Abdominal: negative for nausea, vomiting, diarrhea, bright red blood per rectum, melena, or hematemesis Neurologic: negative for visual changes, syncope, or dizziness All other systems reviewed and are otherwise negative except as noted above.    Blood pressure 128/72, pulse 73, height 6' (1.829 m),  weight 170 lb (77.1 kg).  General appearance: alert and no distress Neck: no adenopathy, no carotid bruit, no JVD, supple, symmetrical, trachea midline and thyroid not enlarged, symmetric, no tenderness/mass/nodules Lungs: clear to auscultation bilaterally Heart: regular rate and rhythm, S1, S2 normal, no murmur, click, rub or gallop Extremities: extremities normal, atraumatic, no cyanosis or edema  EKG sinus rhythm at 73 with Q waves from V1 through V4 and  inferolateral T-wave inversion unchanged from prior EKGs. I personally reviewed this EKG.  ASSESSMENT AND PLAN:   STEMI of anterior wall, 06/25/16 History of STEMI 06/25/16 while he was at work at [employer]. He is brought emergently to the Cath Lab were performed coronary angiography revealing left main/three-vessel disease with severe LV dysfunction (EF 25-30%). He underwent emergent coronary artery bypass grafting X 4 by Dr. Pamalee Leyden with a LIMA to his LAD, vein to obtuse marginal branch and sequential vein to the PDA and PLA. He was discharged home on 06/30/16. His EF at the time of discharge was 30-35%, 3 months later had improved up to 40-45%. He denies chest pain or shortness of breath. He currently is on carvedilol 18.75 mg by mouth twice a day which we'll increase to 25 mg by mouth twice a day and recheck a 2-D echocardiogram.  Cardiomyopathy, ischemic History of ischemic cardiomyopathy with recent echo performed 10/13/16 revealing ejection fraction of 40-45% improved from 25-30% 3 months before. We will increase his carvedilol from 18.75 mg twice a day to 25 mg by mouth twice a day twice a day and recheck a 2-D echocardiogram. He is currently asymptomatic.  Dyslipidemia History of hyperlipidemia on high-dose atorvastatin with recent lipid profile performed 09/01/16 revealing total cholesterol of 93, LDL 53 and HDL of 27.      Lorretta Harp MD [facility], [facility] 02/23/2017 10:11 AM

## 2017-02-23 NOTE — Assessment & Plan Note (Signed)
History of ischemic cardiomyopathy with recent echo performed 10/13/16 revealing ejection fraction of 40-45% improved from 25-30% 3 months before. We will increase his carvedilol from 18.75 mg twice a day to 25 mg by mouth twice a day twice a day and recheck a 2-D echocardiogram. He is currently asymptomatic.

## 2017-02-23 NOTE — Assessment & Plan Note (Signed)
History of STEMI 06/25/16 while he was at work at [employer]. He is brought emergently to the Cath Lab were performed coronary angiography revealing left main/three-vessel disease with severe LV dysfunction (EF 25-30%). He underwent emergent coronary artery bypass grafting X 4 by Dr. Pamalee Leyden with a LIMA to his LAD, vein to obtuse marginal branch and sequential vein to the PDA and PLA. He was discharged home on 06/30/16. His EF at the time of discharge was 30-35%, 3 months later had improved up to 40-45%. He denies chest pain or shortness of breath. He currently is on carvedilol 18.75 mg by mouth twice a day which we'll increase to 25 mg by mouth twice a day and recheck a 2-D echocardiogram.

## 2017-02-23 NOTE — Assessment & Plan Note (Signed)
History of hyperlipidemia on high-dose atorvastatin with recent lipid profile performed 09/01/16 revealing total cholesterol of 93, LDL 53 and HDL of 27.

## 2017-02-23 NOTE — Patient Instructions (Signed)
Medication Instructions: Increase Carvedilol to 25 mg twice daily.   Testing/Procedures: Your physician has requested that you have an echocardiogram. Echocardiography is a painless test that uses sound waves to create images of your heart. It provides your doctor with information about the size and shape of your heart and how well your heart's chambers and valves are working. This procedure takes approximately one hour. There are no restrictions for this procedure.  Follow-Up: Your physician wants you to follow-up in: 6 months with Dr. Gwenlyn Found. You will receive a reminder letter in the mail two months in advance. If you don't receive a letter, please call our office to schedule the follow-up appointment.  If you need a refill on your cardiac medications before your next appointment, please call your pharmacy.

## 2017-03-02 ENCOUNTER — Other Ambulatory Visit: Payer: Self-pay

## 2017-03-02 ENCOUNTER — Ambulatory Visit (HOSPITAL_COMMUNITY): Payer: 59 | Attending: Cardiology

## 2017-03-02 DIAGNOSIS — I2510 Atherosclerotic heart disease of native coronary artery without angina pectoris: Secondary | ICD-10-CM | POA: Insufficient documentation

## 2017-03-02 DIAGNOSIS — Z951 Presence of aortocoronary bypass graft: Secondary | ICD-10-CM | POA: Diagnosis not present

## 2017-03-02 DIAGNOSIS — I252 Old myocardial infarction: Secondary | ICD-10-CM | POA: Insufficient documentation

## 2017-03-02 DIAGNOSIS — I519 Heart disease, unspecified: Secondary | ICD-10-CM | POA: Diagnosis not present

## 2017-03-02 DIAGNOSIS — I2109 ST elevation (STEMI) myocardial infarction involving other coronary artery of anterior wall: Secondary | ICD-10-CM | POA: Diagnosis not present

## 2017-03-02 DIAGNOSIS — I255 Ischemic cardiomyopathy: Secondary | ICD-10-CM | POA: Diagnosis not present

## 2017-03-02 DIAGNOSIS — I080 Rheumatic disorders of both mitral and aortic valves: Secondary | ICD-10-CM | POA: Diagnosis not present

## 2017-03-02 DIAGNOSIS — E785 Hyperlipidemia, unspecified: Secondary | ICD-10-CM | POA: Diagnosis not present

## 2017-03-19 ENCOUNTER — Ambulatory Visit (INDEPENDENT_AMBULATORY_CARE_PROVIDER_SITE_OTHER): Payer: 59 | Admitting: Cardiovascular Disease

## 2017-03-19 ENCOUNTER — Encounter: Payer: Self-pay | Admitting: Cardiovascular Disease

## 2017-03-19 VITALS — BP 120/70 | HR 68 | Ht 72.0 in | Wt 163.0 lb

## 2017-03-19 DIAGNOSIS — I351 Nonrheumatic aortic (valve) insufficiency: Secondary | ICD-10-CM | POA: Insufficient documentation

## 2017-03-19 NOTE — Assessment & Plan Note (Signed)
Recent 2-D echocardiogram performed 03/02/17 revealed an ejection fraction of 40-45% with moderate aortic insufficiency. The echo performed in November showed mild aortic insufficiency. We will repeat a 2-D echocardiogram in 6 months

## 2017-03-19 NOTE — Patient Instructions (Signed)
Medication Instructions: Your physician recommends that you continue on your current medications as directed. Please refer to the Current Medication list given to you today.  Testing/Procedures: Your physician has requested that you have an echocardiogram. Echocardiography is a painless test that uses sound waves to create images of your heart. It provides your doctor with information about the size and shape of your heart and how well your heart's chambers and valves are working. This procedure takes approximately one hour. There are no restrictions for this procedure.  Follow-Up: Your physician recommends that you schedule a follow-up appointment in: 3 months with Dr. Gwenlyn Found.  If you need a refill on your cardiac medications before your next appointment, please call your pharmacy.

## 2017-03-19 NOTE — Progress Notes (Signed)
Benjamin Robbins returns here for follow-up of his 2-D echocardiogram that was performed 03/02/17. That showed an EF of 40-45% with moderate AI. He also complains of some mild substernal chest pain with exertion. I will see him back in 3 months for follow-up.

## 2017-05-05 MED FILL — ATORVASTATIN 80 MG TABLET: 80 | 90 days supply | Qty: 90 | Fill #2

## 2017-05-06 MED FILL — LISINOPRIL 5 MG TABLET: 5 | 90 days supply | Qty: 90 | Fill #3

## 2017-05-06 MED FILL — TOPIRAMATE 100 MG TABLET: 100 | 90 days supply | Qty: 90 | Fill #0

## 2017-05-18 DIAGNOSIS — Z0000 Encounter for general adult medical examination without abnormal findings: Secondary | ICD-10-CM | POA: Diagnosis not present

## 2017-05-18 DIAGNOSIS — R899 Unspecified abnormal finding in specimens from other organs, systems and tissues: Secondary | ICD-10-CM | POA: Diagnosis not present

## 2017-05-18 DIAGNOSIS — E559 Vitamin D deficiency, unspecified: Secondary | ICD-10-CM | POA: Diagnosis not present

## 2017-06-09 MED FILL — SHINGRIX 50 MCG SUS: 50 | 1 days supply | Qty: 1 | Fill #0

## 2017-07-05 ENCOUNTER — Other Ambulatory Visit: Payer: Self-pay | Admitting: Cardiovascular Disease

## 2017-07-05 MED FILL — CARVEDILOL 25 MG TABLET: 25 | 30 days supply | Qty: 60 | Fill #0

## 2017-07-06 ENCOUNTER — Ambulatory Visit (INDEPENDENT_AMBULATORY_CARE_PROVIDER_SITE_OTHER): Payer: 59 | Admitting: Cardiovascular Disease

## 2017-07-06 ENCOUNTER — Encounter: Payer: Self-pay | Admitting: Cardiovascular Disease

## 2017-07-06 VITALS — BP 126/74 | HR 66 | Ht 72.0 in | Wt 171.0 lb

## 2017-07-06 DIAGNOSIS — I255 Ischemic cardiomyopathy: Secondary | ICD-10-CM | POA: Diagnosis not present

## 2017-07-06 DIAGNOSIS — I351 Nonrheumatic aortic (valve) insufficiency: Secondary | ICD-10-CM | POA: Diagnosis not present

## 2017-07-06 DIAGNOSIS — I25110 Atherosclerotic heart disease of native coronary artery with unstable angina pectoris: Secondary | ICD-10-CM

## 2017-07-06 DIAGNOSIS — Z951 Presence of aortocoronary bypass graft: Secondary | ICD-10-CM | POA: Diagnosis not present

## 2017-07-06 DIAGNOSIS — E785 Hyperlipidemia, unspecified: Secondary | ICD-10-CM | POA: Diagnosis not present

## 2017-07-06 NOTE — Assessment & Plan Note (Signed)
History of hyperlipidemia on statin therapy with lipid profile performed 09/01/16 revealed total cholesterol 93, LDL 53 and HDL 27.

## 2017-07-06 NOTE — Assessment & Plan Note (Signed)
History of moderate aortic insufficiency by 2-D echo recently performed 03/02/17. We will follow this on an annual basis.

## 2017-07-06 NOTE — Assessment & Plan Note (Signed)
History of ischemic cardiomyopathy with ejection fraction in the 40-45% range. He is on carvedilol and lisinopril. He is relatively a symptomatic from this.

## 2017-07-06 NOTE — Progress Notes (Signed)
07/06/2017 Benjamin Robbins   08-21-1956  [id number]  Primary Physician Benjamin Cruel, MD Primary Cardiologist: Benjamin Harp MD Benjamin Robbins, [place]  HPI:  Benjamin Robbins is a 61 y.o. male  thin-appearing married Caucasian male father of 44, grandfather 6 grandchildren who I last saw in the office 03/19/17. He has no prior cardiac history or risk factors other than premature family history. I last saw him in the office 09/01/16. 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 He developed substernal chest pain and was brought urgently to the emergency room where an EKG showed anterior ST segment elevation with reciprocal inferior depression. He was treated with aspirin and IV nitroglycerin was brought emergently to the Cath Lab where I performed cardiac catheterization revealing left main/three-vessel disease with moderately severe LV dysfunction (EF 25-30%). He underwent emergent coronary artery bypass grafting X 4 by Dr. Cyndia Bent with a LIMA to the LAD, vein to obtuse marginal branch and sequential vein to the PDA and PLA. He was discharged home on 06/30/16. He has been doing well and recuperating nicely. He finished a 12 week, outpatient cardiac rehabilitation program. He currently is a symptomatic. He did have a 2-D echocardiogram performed 10/13/16 revealing an ejection fraction of 40-45%, moderately improved compared to his EF at the time of his STEMI which was 25-30%. When I saw him in the office 4 months ago he was complaining of chest pain on a weekly basis which has improved markedly in frequency and severity. A follow-up 2-D echo performed 03/02/17 revealed an EF of 40-45%, similar to his prior echo with moderate aortic insufficiency.    Current Meds  Medication Sig  . aspirin 81 MG tablet Take 81 mg by mouth daily.  [name] atorvastatin (LIPITOR) 80 MG tablet Take 1 tablet (80 mg total) by mouth daily at  6 PM.  . carvedilol (COREG) 25 MG tablet TAKE 1 TABLET (25 MG TOTAL) BY MOUTH 2 TIMES DAILY.  [name] esomeprazole (NEXIUM) 40 MG capsule Take 40 mg by mouth daily as needed (reflux).  [name] lisinopril (PRINIVIL,ZESTRIL) 5 MG tablet Take 1 tablet (5 mg total) by mouth daily.  . Multiple Vitamin (MULTIVITAMINS PO) Take 1 tablet by mouth daily.  [name] topiramate (TOPAMAX) 100 MG tablet Take 100 mg by mouth at bedtime.  [name] VITAMIN D, ERGOCALCIFEROL, PO Take 5,000 Units by mouth daily.     No Known Allergies  Social History   Social History  . Marital status: Married    Spouse name: [name]  . Number of children: 3  . Years of education: [name]   Occupational History  . 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 Smoking status: Never Smoker  . Smokeless tobacco: Never Used  . Alcohol use No  . Drug use: No  . Sexual activity: Not on file   Other Topics Concern  . Not on file   Social History Narrative   Daily caffeine     Review of Systems: General: negative for chills, fever, night sweats or weight changes.  Cardiovascular: negative for chest pain, dyspnea on exertion, edema, orthopnea, palpitations, paroxysmal nocturnal dyspnea or shortness of breath Dermatological: negative for rash Respiratory: negative for cough or wheezing Urologic: negative for hematuria Abdominal: negative for nausea, vomiting, diarrhea, bright red blood per rectum, melena, or hematemesis Neurologic: negative for visual changes, syncope, or dizziness All other systems  reviewed and are otherwise negative except as noted above.    Blood pressure 126/74, pulse 66, height 6' (1.829 m), weight 171 lb (77.6 kg).  General appearance: alert and no distress Neck: no adenopathy, no carotid bruit, no JVD, supple, symmetrical, trachea midline and thyroid not enlarged, symmetric, no tenderness/mass/nodules Lungs: clear to auscultation bilaterally Heart: regular rate and rhythm, S1, S2 normal, no murmur, click, rub  or gallop Extremities: extremities normal, atraumatic, no cyanosis or edema  EKG sinus rhythm at 66 with septal Q waves and anterolateral T-wave inversion unchanged from the prior EKG. I personally reviewed this EKG  ASSESSMENT AND PLAN:   S/P CABG x 4 History of STEMI 06/25/16 with 3 vessel disease and severe LV dysfunction. His EF was 25-35% with elevated LVEDP. He underwent emergency coronary artery bypass grafting 4 by Dr. Caffie Pinto the LIMA to his LAD, vein to obtuse marginal branch, PDA and PLA sequentially. He was discharged home on 06/30/16 and didn't participate in cardiac rehabilitation. His most recent 2-D echo performed 03/02/17 revealed slight improvement in his ejection fraction 40-45% with moderate aortic insufficiency. He was complaining of some chest pain when I saw him back 4 months ago on a weekly basis and this has improved in frequency and severity.  Cardiomyopathy, ischemic History of ischemic cardiomyopathy with ejection fraction in the 40-45% range. He is on carvedilol and lisinopril. He is relatively a symptomatic from this.  Dyslipidemia History of hyperlipidemia on statin therapy with lipid profile performed 09/01/16 revealed total cholesterol 93, LDL 53 and HDL 27.  Moderate aortic insufficiency History of moderate aortic insufficiency by 2-D echo recently performed 03/02/17. We will follow this on an annual basis.      Benjamin Harp MD FACP,FACC,FAHA, [facility] 07/06/2017 4:51 PM

## 2017-07-06 NOTE — Patient Instructions (Signed)
Medication Instructions: Your physician recommends that you continue on your current medications as directed. Please refer to the Current Medication list given to you today.  Labwork: Your physician recommends that you return for a FASTING lipid profile and hepatic function panel.  Testing/Procedures: Reschedule your echo to April 2019.  Follow-Up: Your physician wants you to follow-up in: 6 months with Dr. Gwenlyn Found. You will receive a reminder letter in the mail two months in advance. If you don't receive a letter, please call our office to schedule the follow-up appointment.  If you need a refill on your cardiac medications before your next appointment, please call your pharmacy.

## 2017-07-06 NOTE — Assessment & Plan Note (Signed)
History of STEMI 06/25/16 with 3 vessel disease and severe LV dysfunction. His EF was 25-35% with elevated LVEDP. He underwent emergency coronary artery bypass grafting 4 by Dr. Caffie Pinto the LIMA to his LAD, vein to obtuse marginal branch, PDA and PLA sequentially. He was discharged home on 06/30/16 and didn't participate in cardiac rehabilitation. His most recent 2-D echo performed 03/02/17 revealed slight improvement in his ejection fraction 40-45% with moderate aortic insufficiency. He was complaining of some chest pain when I saw him back 4 months ago on a weekly basis and this has improved in frequency and severity.

## 2017-08-06 MED FILL — CARVEDILOL 25 MG TABLET: 25 | 30 days supply | Qty: 60 | Fill #1

## 2017-08-10 ENCOUNTER — Other Ambulatory Visit: Payer: Self-pay | Admitting: Cardiovascular Disease

## 2017-08-10 MED FILL — ATORVASTATIN 80 MG TABLET: 80 | 30 days supply | Qty: 30 | Fill #0

## 2017-08-11 MED FILL — SHINGRIX 50 MCG SUS: 50 | 1 days supply | Qty: 1 | Fill #1

## 2017-08-19 ENCOUNTER — Other Ambulatory Visit: Payer: Self-pay | Admitting: Cardiovascular Disease

## 2017-08-19 DIAGNOSIS — E785 Hyperlipidemia, unspecified: Secondary | ICD-10-CM | POA: Diagnosis not present

## 2017-08-19 LAB — LIPID PANEL
Chol/HDL Ratio: 2.9 ratio (ref 0.0–5.0)
Cholesterol, Total: 105 mg/dL (ref 100–199)
HDL: 36 mg/dL — ABNORMAL LOW (ref >39)
LDL Calculated: 57 mg/dL (ref 0–99)
Triglycerides: 62 mg/dL (ref 0–149)
VLDL Cholesterol Cal: 12 mg/dL (ref 5–40)

## 2017-08-19 LAB — HEPATIC FUNCTION PANEL
ALT: 39 IU/L (ref 0–44)
AST: 30 IU/L (ref 0–40)
Albumin: 4.2 g/dL (ref 3.6–4.8)
Alkaline Phosphatase: 80 IU/L (ref 39–117)
Bilirubin Total: 0.5 mg/dL (ref 0.0–1.2)
Bilirubin, Direct: 0.16 mg/dL (ref 0.00–0.40)
Total Protein: 6.4 g/dL (ref 6.0–8.5)

## 2017-08-30 ENCOUNTER — Other Ambulatory Visit: Payer: Self-pay | Admitting: Cardiology

## 2017-08-30 DIAGNOSIS — H5203 Hypermetropia, bilateral: Secondary | ICD-10-CM | POA: Diagnosis not present

## 2017-08-30 DIAGNOSIS — H524 Presbyopia: Secondary | ICD-10-CM | POA: Diagnosis not present

## 2017-08-30 MED FILL — LISINOPRIL 5 MG TAB: 5 | 90 days supply | Qty: 90 | Fill #0

## 2017-08-30 MED FILL — TOPIRAMATE 100 MG TABLET: 100 | 90 days supply | Qty: 90 | Fill #0

## 2017-08-30 NOTE — Telephone Encounter (Signed)
REFILL 

## 2017-09-06 MED FILL — CARVEDILOL 25 MG TABLET: 25 | 30 days supply | Qty: 60 | Fill #2

## 2017-09-07 ENCOUNTER — Other Ambulatory Visit (HOSPITAL_COMMUNITY): Payer: 59

## 2017-09-16 MED FILL — ATORVASTATIN 80 MG TABLET: 80 | 30 days supply | Qty: 30 | Fill #1

## 2017-10-07 ENCOUNTER — Other Ambulatory Visit: Payer: Self-pay | Admitting: Cardiovascular Disease

## 2017-10-07 MED FILL — CARVEDILOL 25 MG TABLET: 25 | 30 days supply | Qty: 60 | Fill #0

## 2017-10-07 NOTE — Telephone Encounter (Signed)
Rx(s) sent to pharmacy electronically.  

## 2017-10-18 MED FILL — ATORVASTATIN 80 MG TABLET: 80 | 30 days supply | Qty: 30 | Fill #2

## 2017-10-22 IMAGING — CR DG CHEST 1V PORT
1 series · 1 of 1 positions shown · non-contrast
Comparison: 05/23/2014; 04/10/2014

CLINICAL DATA: Post open heart surgery.

EXAM:
PORTABLE CHEST 1 VIEW

[AP]
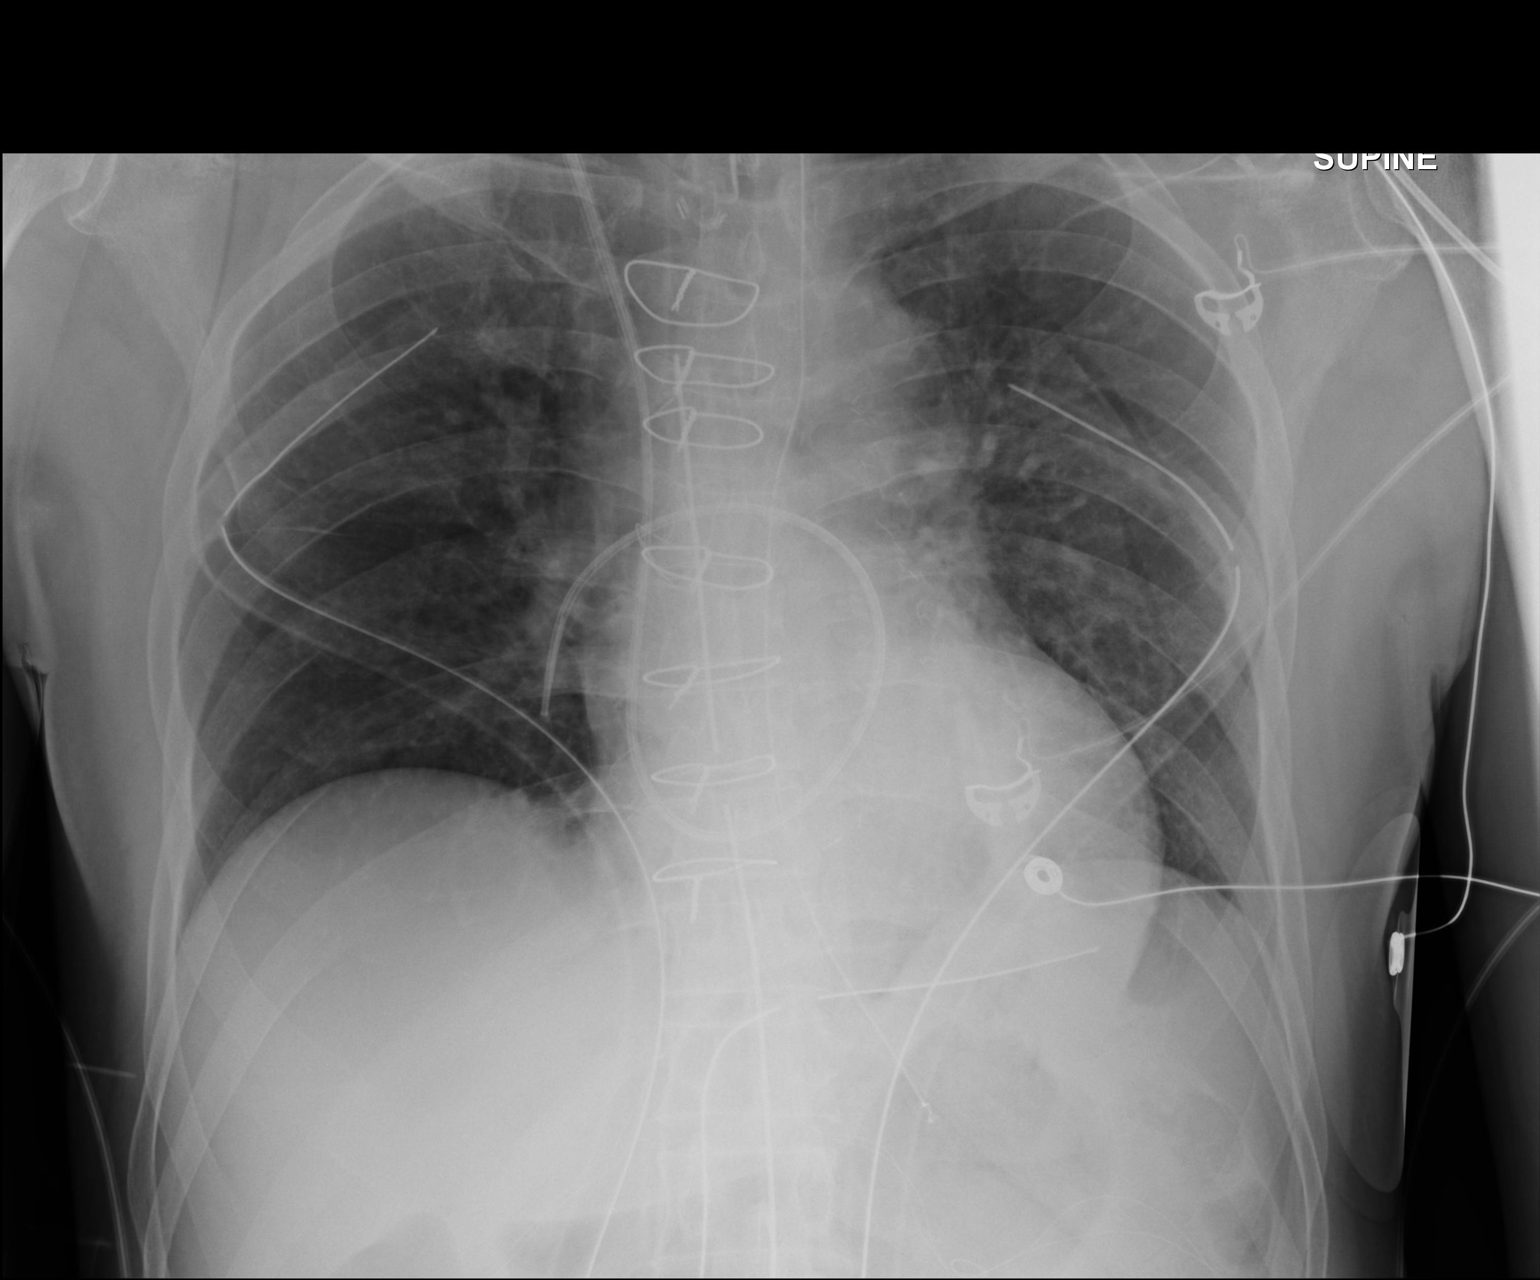

[1 of 1 positions shown; findings below may reference images not displayed]

FINDINGS: Mildly enlarged cardiac silhouette and mediastinal contours, likely
the sequela of supine patient positioning an interval median
sternotomy. Endotracheal tube overlies the tracheal air column with
tip approximately 5.8 cm above the carina. Enteric tube side port
projects over the distal esophagus. Right jugular approach PA
catheter tip overlies the right lower lobe pulmonary artery.
Bilateral chest tubes. No supine evidence of pneumothorax or pleural
effusion. Minimal perihilar opacities, left greater than right,
favored to represent atelectasis. No definite evidence of edema. No
acute osseus abnormalities.
IMPRESSION: 1. Enteric tube side port projects over the distal esophagus.
Advancement approximately 10 cm is recommended.
2. Otherwise, appropriately positioned support apparatus as above.
No supine evidence of pneumothorax.
3. Minimal perihilar opacities favored to represent atelectasis. No
definite evidence of edema

## 2017-10-24 IMAGING — CR DG CHEST 2V
2 series · 2 of 2 positions shown · non-contrast
Comparison: 06/27/2016

CLINICAL DATA: Status post CABG, postop day 3.

EXAM:
CHEST  2 VIEW

[chest pa]
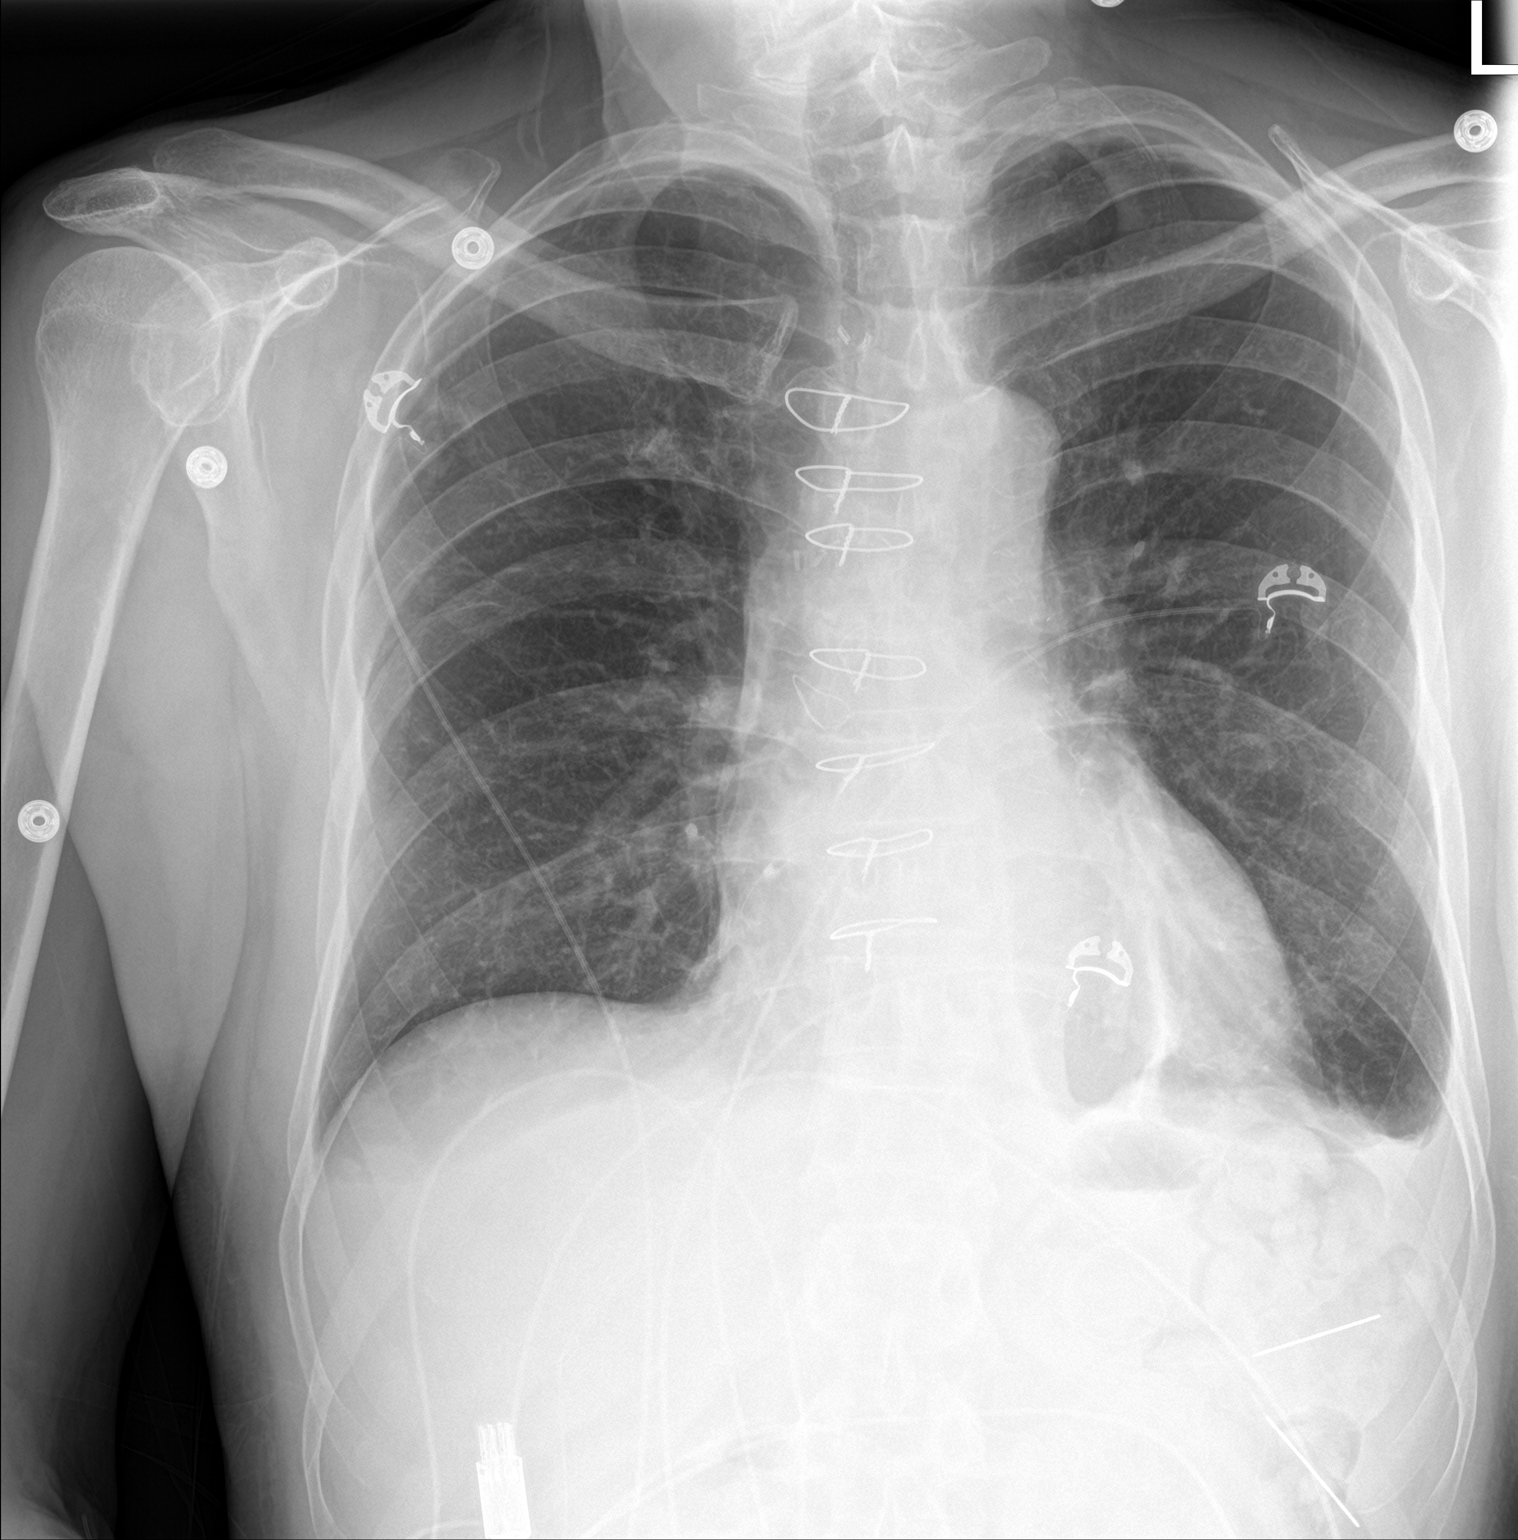

[chest lat]
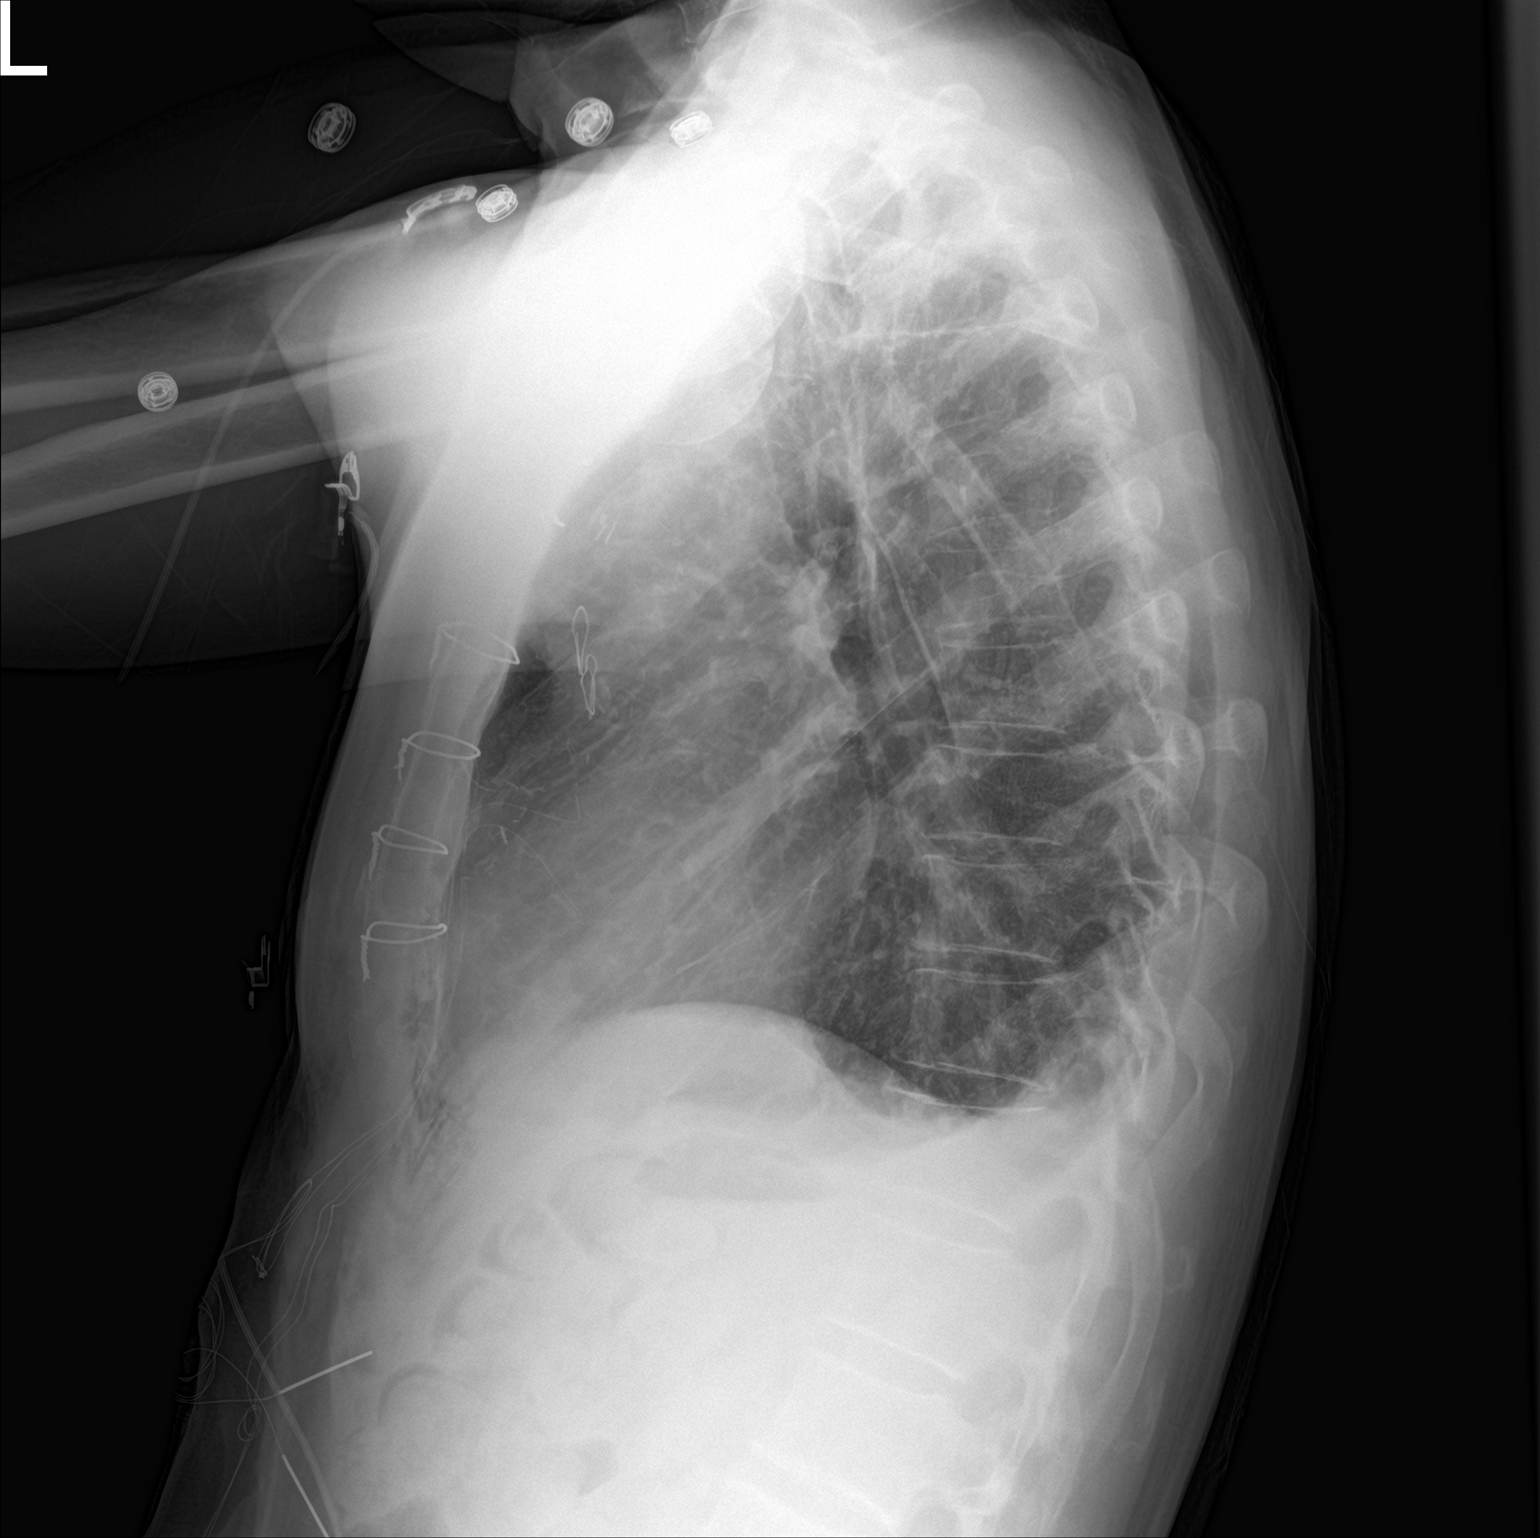

[2 of 2 positions shown; findings below may reference images not displayed]

FINDINGS: A right IJ sheath has been removed.

Mild cardiomegaly and CABG changes again noted.

Very small bilateral pleural effusions and mild left basilar
atelectasis again noted.

There is no evidence of pulmonary edema or pneumothorax.
IMPRESSION: Venous catheter sheath removed, otherwise little significant change.

Very small bilateral pleural effusions and mild left basilar
atelectasis. No evidence of pulmonary edema or pneumothorax.

## 2017-11-09 ENCOUNTER — Other Ambulatory Visit: Payer: Self-pay | Admitting: Cardiovascular Disease

## 2017-11-09 MED FILL — CARVEDILOL 25 MG TABLET: 25 | 30 days supply | Qty: 60 | Fill #0

## 2017-11-17 MED FILL — ATORVASTATIN 80 MG TABLET: 80 | 90 days supply | Qty: 90 | Fill #3

## 2017-11-24 IMAGING — CR DG CHEST 2V
2 series · 2 of 2 positions shown · non-contrast
Comparison: PA and lateral chest x-ray [DATE]

CLINICAL DATA: Status post CABG on [REDACTED]. The patient reports
fatigue but no other complaints.

EXAM:
CHEST  2 VIEW

[w chest pa]
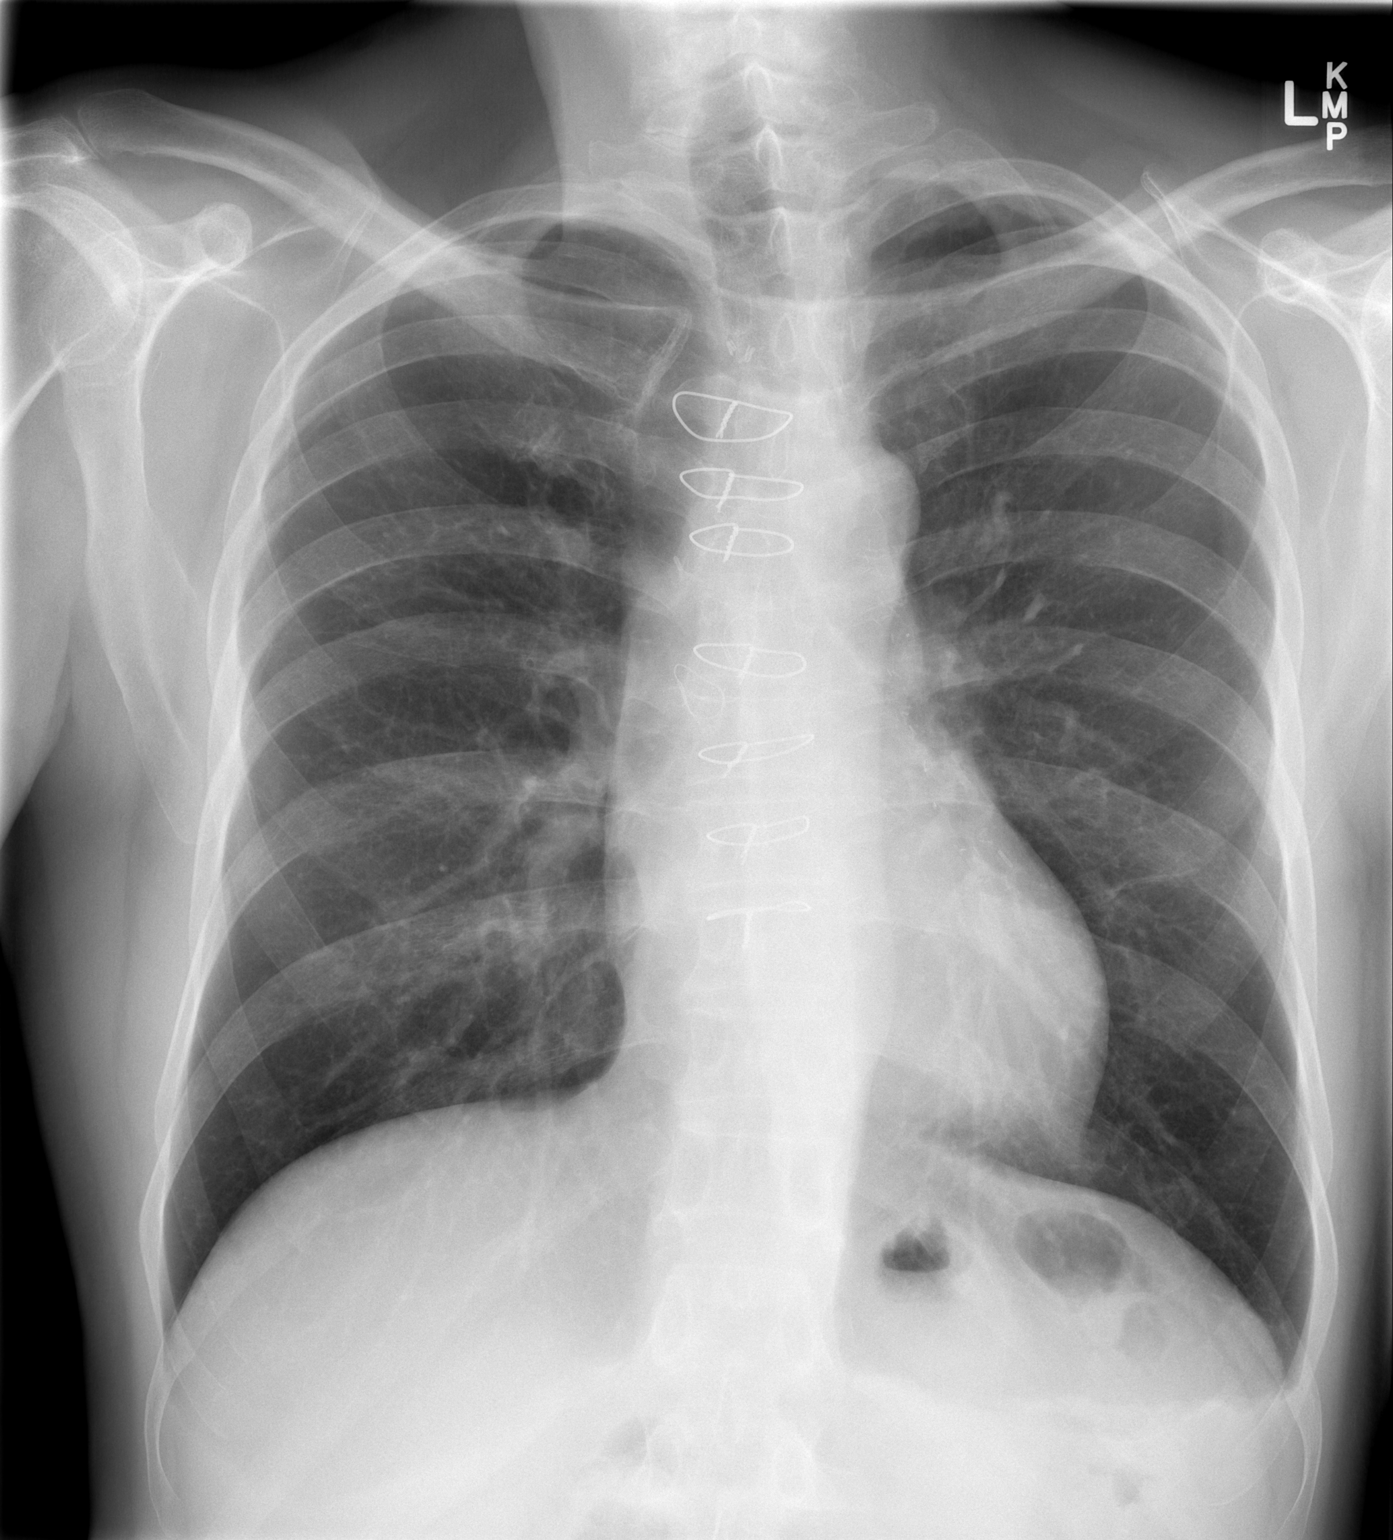

[w chest lat]
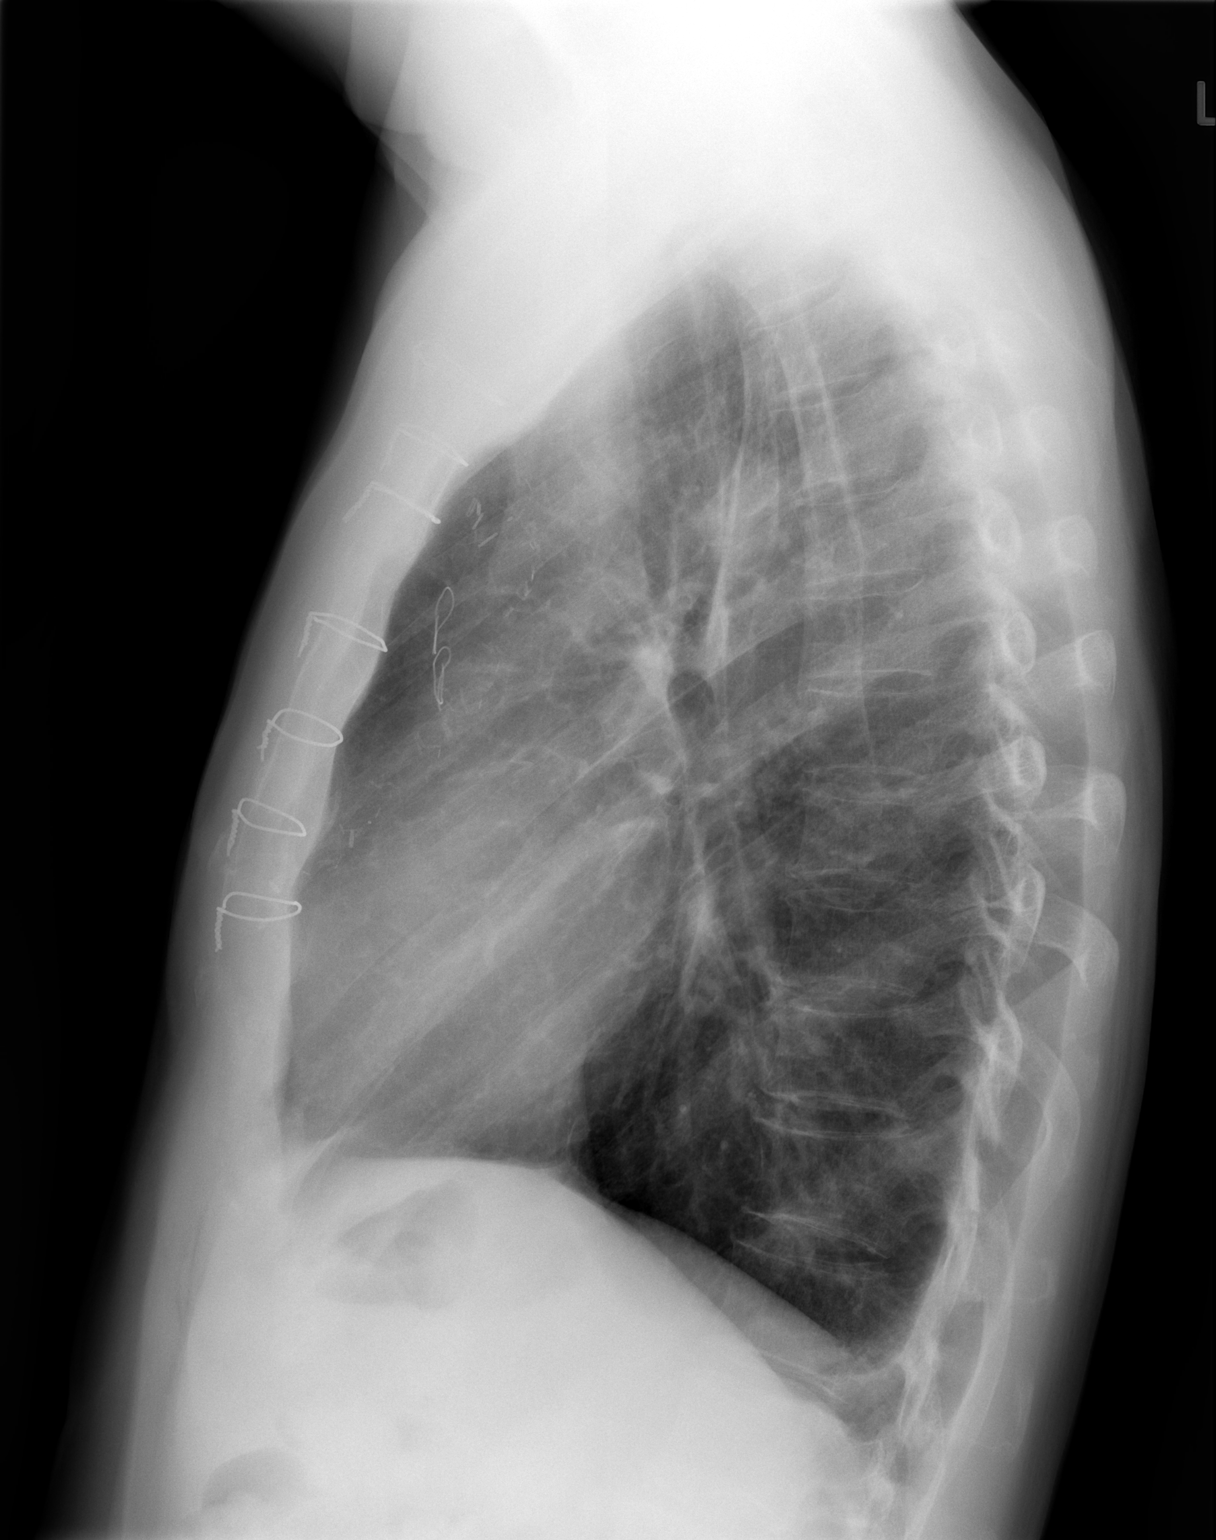

[2 of 2 positions shown; findings below may reference images not displayed]

FINDINGS: The lungs are well-expanded. There is no infiltrate or atelectasis.
There is no pneumothorax. Small bilateral pleural effusions have
nearly totally cleared with only minimal blunting of the posterior
costophrenic angles observed. The heart and pulmonary vascularity
are normal. The mediastinum is normal in width. The retrosternal
soft tissues are normal. The sternal wires are intact. The bony
thorax exhibits no acute abnormality.
IMPRESSION: Mild hyperinflation which may be voluntary or remote may reflect
chronic bronchitis. No CHF nor pneumonia. Near total resolution of
bilateral pleural effusions.

## 2017-11-25 MED FILL — LISINOPRIL 5 MG TAB: 5 | 90 days supply | Qty: 90 | Fill #1

## 2017-11-25 MED FILL — TOPIRAMATE 100 MG TABLET: 100 | 90 days supply | Qty: 90 | Fill #1

## 2017-12-09 ENCOUNTER — Other Ambulatory Visit: Payer: Self-pay | Admitting: Cardiovascular Disease

## 2017-12-09 MED FILL — CARVEDILOL 25 MG TABLET: 25 | 30 days supply | Qty: 60 | Fill #0

## 2018-01-11 ENCOUNTER — Other Ambulatory Visit: Payer: Self-pay | Admitting: Cardiovascular Disease

## 2018-01-11 MED FILL — CARVEDILOL 25 MG TABLET: 25 | 30 days supply | Qty: 60 | Fill #0

## 2018-01-21 DIAGNOSIS — H00025 Hordeolum internum left lower eyelid: Secondary | ICD-10-CM | POA: Diagnosis not present

## 2018-01-21 MED FILL — OFLOXACIN 0.3% EYE DROPS: 0.3 | 25 days supply | Qty: 5 | Fill #0

## 2018-02-14 ENCOUNTER — Other Ambulatory Visit: Payer: Self-pay | Admitting: Cardiovascular Disease

## 2018-02-14 MED FILL — CARVEDILOL 25 MG TABLET: 25 | 30 days supply | Qty: 60 | Fill #0

## 2018-02-14 NOTE — Telephone Encounter (Signed)
REFILL 

## 2018-02-28 ENCOUNTER — Other Ambulatory Visit: Payer: Self-pay | Admitting: Cardiology

## 2018-02-28 MED FILL — LISINOPRIL 5 MG TABLET: 5 | 90 days supply | Qty: 90 | Fill #0

## 2018-02-28 MED FILL — TOPIRAMATE 100 MG TABLET: 100 | 90 days supply | Qty: 90 | Fill #2

## 2018-02-28 NOTE — Telephone Encounter (Signed)
REFILL 

## 2018-03-03 ENCOUNTER — Other Ambulatory Visit: Payer: Self-pay | Admitting: Cardiovascular Disease

## 2018-03-03 MED FILL — ATORVASTATIN 80 MG TABLET: 80 | 30 days supply | Qty: 30 | Fill #0

## 2018-03-11 ENCOUNTER — Other Ambulatory Visit: Payer: Self-pay

## 2018-03-11 ENCOUNTER — Ambulatory Visit (HOSPITAL_COMMUNITY): Payer: 59 | Attending: Cardiology

## 2018-03-11 DIAGNOSIS — I351 Nonrheumatic aortic (valve) insufficiency: Secondary | ICD-10-CM

## 2018-03-11 DIAGNOSIS — I255 Ischemic cardiomyopathy: Secondary | ICD-10-CM | POA: Diagnosis not present

## 2018-03-11 DIAGNOSIS — I083 Combined rheumatic disorders of mitral, aortic and tricuspid valves: Secondary | ICD-10-CM | POA: Insufficient documentation

## 2018-03-11 DIAGNOSIS — I2510 Atherosclerotic heart disease of native coronary artery without angina pectoris: Secondary | ICD-10-CM | POA: Insufficient documentation

## 2018-03-11 DIAGNOSIS — Z951 Presence of aortocoronary bypass graft: Secondary | ICD-10-CM | POA: Diagnosis not present

## 2018-03-11 DIAGNOSIS — E785 Hyperlipidemia, unspecified: Secondary | ICD-10-CM | POA: Insufficient documentation

## 2018-03-11 DIAGNOSIS — I252 Old myocardial infarction: Secondary | ICD-10-CM | POA: Diagnosis not present

## 2018-03-14 MED FILL — CARVEDILOL 25 MG TABLET: 25 | 30 days supply | Qty: 60 | Fill #1

## 2018-03-16 ENCOUNTER — Other Ambulatory Visit: Payer: Self-pay | Admitting: Cardiovascular Disease

## 2018-03-16 DIAGNOSIS — I351 Nonrheumatic aortic (valve) insufficiency: Secondary | ICD-10-CM

## 2018-03-29 ENCOUNTER — Ambulatory Visit: Payer: 59 | Admitting: Cardiovascular Disease

## 2018-03-29 ENCOUNTER — Encounter: Payer: Self-pay | Admitting: Cardiovascular Disease

## 2018-03-29 VITALS — BP 139/83 | HR 63 | Ht 72.0 in | Wt 174.0 lb

## 2018-03-29 DIAGNOSIS — E785 Hyperlipidemia, unspecified: Secondary | ICD-10-CM

## 2018-03-29 DIAGNOSIS — I255 Ischemic cardiomyopathy: Secondary | ICD-10-CM

## 2018-03-29 DIAGNOSIS — I2109 ST elevation (STEMI) myocardial infarction involving other coronary artery of anterior wall: Secondary | ICD-10-CM | POA: Diagnosis not present

## 2018-03-29 DIAGNOSIS — I351 Nonrheumatic aortic (valve) insufficiency: Secondary | ICD-10-CM

## 2018-03-29 DIAGNOSIS — I25110 Atherosclerotic heart disease of native coronary artery with unstable angina pectoris: Secondary | ICD-10-CM | POA: Diagnosis not present

## 2018-03-29 NOTE — Assessment & Plan Note (Signed)
History of dyslipidemia on statin therapy with lipid profile performed 08/19/2017 revealing an LDL of 57.

## 2018-03-29 NOTE — Progress Notes (Signed)
03/29/2018 Benjamin Robbins   26-Aug-1956  [id number]  Primary Physician Benjamin Cruel, MD Primary Cardiologist: Benjamin Harp MD Benjamin Robbins, [place]  HPI:  Benjamin Robbins is a 62 y.o.  thin-appearing married Caucasian male father of 75, grandfather 6 grandchildren who I last saw in the office 07/06/2017. He has no prior cardiac history or risk factors other than premature family history. I last saw him in the office 09/01/16. 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 He developed substernal chest pain and was brought urgently to the emergency room where an EKG showed anterior ST segment elevation with reciprocal inferior depression. He was treated with aspirin and IV nitroglycerin was brought emergently to the Cath Lab where I performed cardiac catheterization revealing left main/three-vessel disease with moderately severe LV dysfunction (EF 25-30%). He underwent emergent coronary artery bypass grafting X 4 by Dr. Cyndia Robbins with a LIMA to the LAD, vein to obtuse marginal branch and sequential vein to the PDA and PLA. He was discharged home on 06/30/16. He has been doing well and recuperating nicely. He finished a 12 week, outpatient cardiac rehabilitation program. He currently is a symptomatic. He did have a 2-D echocardiogram performed 10/13/16 revealing an ejection fraction of 40-45%, moderately improved compared to his EF at the time of his STEMI which was 25-30%. When I saw him in the office 4 months ago he was complaining of chest pain on a weekly basis which has improved markedly in frequency and severity. A follow-up 2-D echo performed 03/02/17 revealed an EF of 40-45%, similar to his prior echo with moderate aortic insufficiency.  His most recent echo however performed 03/11/2018 revealed similar EF with only mild AI.  He is relatively asymptomatic.     Current Meds  Medication Sig  . aspirin 81 MG  tablet Take 81 mg by mouth daily.  [name] atorvastatin (LIPITOR) 80 MG tablet TAKE 1 TABLET (80 MG TOTAL) BY MOUTH DAILY AT 6 PM.  . carvedilol (COREG) 25 MG tablet Take 1 tablet (25 mg total) by mouth 2 (two) times daily with a meal. KEEP OV.  . esomeprazole (NEXIUM) 40 MG capsule Take 40 mg by mouth daily as needed (reflux).  [name] lisinopril (PRINIVIL,ZESTRIL) 5 MG tablet TAKE 1 TABLET (5 MG TOTAL) BY MOUTH DAILY.  . Multiple Vitamin (MULTIVITAMINS PO) Take 1 tablet by mouth daily.  [name] topiramate (TOPAMAX) 100 MG tablet Take 100 mg by mouth at bedtime.  [name] VITAMIN D, ERGOCALCIFEROL, PO Take 5,000 Units by mouth daily.     No Known Allergies  Social History   Socioeconomic History  . Marital status: Married    Spouse name: Not on file  . Number of children: 3  . Years of education: Not on file  . Highest education level: Not on file  Occupational History  . 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 Financial resource strain: Not on file  . Food insecurity:    Worry: Not on file    Inability: Not on file  . Transportation needs:    Medical: Not on file    Non-medical: Not on file  Tobacco Use  . Smoking status: Never Smoker  . Smokeless tobacco: Never Used  Substance and Sexual Activity  . Alcohol use: No  . Drug use: No  . Sexual activity: Not on file  Lifestyle  .  Physical activity:    Days per week: Not on file    Minutes per session: Not on file  . Stress: Not on file  Relationships  . Social connections:    Talks on phone: Not on file    Gets together: Not on file    Attends religious service: Not on file    Active member of club or organization: Not on file    Attends meetings of clubs or organizations: Not on file    Relationship status: Not on file  . Intimate partner violence:    Fear of current or ex partner: Not on file    Emotionally abused: Not on file    Physically abused: Not on file    Forced sexual activity: Not on file  Other  Topics Concern  . Not on file  Social History Narrative   Daily caffeine     Review of Systems: General: negative for chills, fever, night sweats or weight changes.  Cardiovascular: negative for chest pain, dyspnea on exertion, edema, orthopnea, palpitations, paroxysmal nocturnal dyspnea or shortness of breath Dermatological: negative for rash Respiratory: negative for cough or wheezing Urologic: negative for hematuria Abdominal: negative for nausea, vomiting, diarrhea, bright red blood per rectum, melena, or hematemesis Neurologic: negative for visual changes, syncope, or dizziness All other systems reviewed and are otherwise negative except as noted above.    Blood pressure 139/83, pulse 63, height 6' (1.829 m), weight 174 lb (78.9 kg).  General appearance: alert and no distress Neck: no adenopathy, no carotid bruit, no JVD, supple, symmetrical, trachea midline and thyroid not enlarged, symmetric, no tenderness/mass/nodules Lungs: clear to auscultation bilaterally Heart: regular rate and rhythm, S1, S2 normal, no murmur, click, rub or gallop Extremities: extremities normal, atraumatic, no cyanosis or edema Pulses: 2+ and symmetric Skin: Skin color, texture, turgor normal. No rashes or lesions Neurologic: Alert and oriented X 3, normal strength and tone. Normal symmetric reflexes. Normal coordination and gait  EKG normal sinus rhythm at 63 with septal Q waves in anterolateral T wave inversion unchanged from prior EKGs.  I personally reviewed this EKG.  ASSESSMENT AND PLAN:   STEMI of anterior wall, 06/25/16 History of CAD status post anterior STEMI 06/25/2016 severe three-vessel disease with severe LV dysfunction.  His EF was in the 20 to 25% range he ultimately underwent vital to his LAD, vein to the obtuse marginal branch, sequential vein to the PDA and PLA.  He was discharged home on 06/30/2016.  He did participate in cardiac rehab.  He is relatively asymptomatic except for some mild  fatigue and dyspnea.  Recent 2D echo showed an EF of 40 to 45% prior echoes although there was only mild aortic insufficiency noted at this time.  Cardiomyopathy, ischemic History of ischemic cardia myopathy with an EF in the 40 to 45% range, significantly higher than the initial EF of 20 to 25% at the time of presentation July 2017.  Dyslipidemia History of dyslipidemia on statin therapy with lipid profile performed 08/19/2017 revealing an LDL of 57.  Moderate aortic insufficiency History of moderate aortic knee by 2D echo in the past with most recent echo performed last month revealing only mild AI.      Benjamin Harp MD FACP,FACC,FAHA, [facility] 03/29/2018 5:18 PM

## 2018-03-29 NOTE — Assessment & Plan Note (Signed)
History of moderate aortic knee by 2D echo in the past with most recent echo performed last month revealing only mild AI.

## 2018-03-29 NOTE — Patient Instructions (Signed)

## 2018-03-29 NOTE — Assessment & Plan Note (Signed)
History of ischemic cardia myopathy with an EF in the 40 to 45% range, significantly higher than the initial EF of 20 to 25% at the time of presentation July 2017.

## 2018-03-29 NOTE — Assessment & Plan Note (Signed)
History of CAD status post anterior STEMI 06/25/2016 severe three-vessel disease with severe LV dysfunction.  His EF was in the 20 to 25% range he ultimately underwent vital to his LAD, vein to the obtuse marginal branch, sequential vein to the PDA and PLA.  He was discharged home on 06/30/2016.  He did participate in cardiac rehab.  He is relatively asymptomatic except for some mild fatigue and dyspnea.  Recent 2D echo showed an EF of 40 to 45% prior echoes although there was only mild aortic insufficiency noted at this time.

## 2018-04-05 MED FILL — ATORVASTATIN 80 MG TABLET: 80 | 30 days supply | Qty: 30 | Fill #1

## 2018-04-15 ENCOUNTER — Other Ambulatory Visit: Payer: Self-pay | Admitting: Cardiovascular Disease

## 2018-04-15 MED FILL — CARVEDILOL 25 MG TABLET: 25 | 30 days supply | Qty: 60 | Fill #0

## 2018-04-15 NOTE — Telephone Encounter (Signed)
Rx request sent to pharmacy.  

## 2018-05-03 MED FILL — ATORVASTATIN 80 MG TABLET: 80 | 30 days supply | Qty: 30 | Fill #2

## 2018-05-19 MED FILL — CARVEDILOL 25 MG TABLET: 25 | 30 days supply | Qty: 60 | Fill #1

## 2018-05-30 ENCOUNTER — Other Ambulatory Visit: Payer: Self-pay | Admitting: Cardiology

## 2018-05-30 MED FILL — LISINOPRIL 5 MG TABLET: 5 | 90 days supply | Qty: 90 | Fill #0

## 2018-05-30 MED FILL — TOPIRAMATE 100 MG TABLET: 100 | 90 days supply | Qty: 90 | Fill #0

## 2018-06-08 MED FILL — ATORVASTATIN 80 MG TABLET: 80 | 90 days supply | Qty: 90 | Fill #3

## 2018-06-21 MED FILL — CARVEDILOL 25 MG TABLET: 25 | 90 days supply | Qty: 180 | Fill #2

## 2018-07-08 DIAGNOSIS — G43109 Migraine with aura, not intractable, without status migrainosus: Secondary | ICD-10-CM | POA: Diagnosis not present

## 2018-07-08 DIAGNOSIS — Z8546 Personal history of malignant neoplasm of prostate: Secondary | ICD-10-CM | POA: Diagnosis not present

## 2018-07-08 DIAGNOSIS — Z13220 Encounter for screening for lipoid disorders: Secondary | ICD-10-CM | POA: Diagnosis not present

## 2018-07-08 DIAGNOSIS — Z0000 Encounter for general adult medical examination without abnormal findings: Secondary | ICD-10-CM | POA: Diagnosis not present

## 2018-07-08 DIAGNOSIS — E559 Vitamin D deficiency, unspecified: Secondary | ICD-10-CM | POA: Diagnosis not present

## 2018-07-08 DIAGNOSIS — K219 Gastro-esophageal reflux disease without esophagitis: Secondary | ICD-10-CM | POA: Diagnosis not present

## 2018-07-08 MED FILL — PANTOPRAZOLE SOD DR 40 MG T: 40 | 90 days supply | Qty: 90 | Fill #0

## 2018-08-17 ENCOUNTER — Other Ambulatory Visit: Payer: Self-pay | Admitting: Cardiovascular Disease

## 2018-08-17 MED FILL — TOPIRAMATE 100 MG TABLET: 100 | 90 days supply | Qty: 90 | Fill #0

## 2018-08-17 MED FILL — LISINOPRIL 5 MG TABLET: 5 | 90 days supply | Qty: 90 | Fill #1

## 2018-08-19 MED FILL — ATORVASTATIN 80 MG TABLET: 80 | 90 days supply | Qty: 90 | Fill #0

## 2018-09-01 DIAGNOSIS — H524 Presbyopia: Secondary | ICD-10-CM | POA: Diagnosis not present

## 2018-09-01 DIAGNOSIS — H5203 Hypermetropia, bilateral: Secondary | ICD-10-CM | POA: Diagnosis not present

## 2018-09-01 DIAGNOSIS — H2513 Age-related nuclear cataract, bilateral: Secondary | ICD-10-CM | POA: Diagnosis not present

## 2018-09-15 MED FILL — CARVEDILOL 25 MG TABLET: 25 | 90 days supply | Qty: 180 | Fill #3

## 2018-10-24 ENCOUNTER — Ambulatory Visit: Payer: Self-pay | Admitting: Obstetrics and Gynecology

## 2018-10-24 VITALS — BP 145/80 | HR 66 | Temp 98.3°F | Resp 16 | Wt 174.4 lb

## 2018-10-24 DIAGNOSIS — H00011 Hordeolum externum right upper eyelid: Secondary | ICD-10-CM | POA: Insufficient documentation

## 2018-10-24 MED ORDER — OFLOXACIN 0.3 % OP SOLN: 1.0000 [drp] | Freq: Four times a day (QID) | OPHTHALMIC | 1 refills | Status: DC

## 2018-10-24 NOTE — Progress Notes (Signed)
  Subjective:     Patient ID: Benjamin Robbins, male   DOB: Apr 04, 1956, 62 y.o.   MRN: [id number]  HPI  [name] is a 62 y.o. male here today with right upper lid swelling and redness. Symptoms started 1 week ago. He has been applying warm compresses which is helping some. He was seen for this almost a year ago and was treated with an eye drop. He found the drops and started using them again however wanted to make sure he was treating it properly. No eye pain. Has noticed some drainage.   Review of Systems  Eyes: Positive for discharge and redness. Negative for photophobia, pain, itching and visual disturbance.   Objective:   Physical Exam  Constitutional: He appears well-developed and well-nourished. No distress.  Eyes: Pupils are equal, round, and reactive to light. EOM are normal. Right eye exhibits discharge and hordeolum. Left eye exhibits no hordeolum. Right conjunctiva is injected. Left conjunctiva is not injected. Left conjunctiva has no hemorrhage.  Skin: He is not diaphoretic.   Assessment:   1. Hordeolum externum of right upper eyelid    Plan:   - Continue warm compresses oftern - Wash hands frequently  - Rx: Ofloxacin - If symptoms worsen see eye provider   Benjamin Lye, NP 10/24/2018 5:48 PM

## 2018-10-25 MED FILL — OFLOXACIN 0.3% EYE DROPS: 0.3 | 25 days supply | Qty: 5 | Fill #0

## 2018-11-25 MED FILL — LISINOPRIL 5 MG TABLET: 5 | 90 days supply | Qty: 90 | Fill #2

## 2018-11-25 MED FILL — TOPIRAMATE 100 MG TABLET: 100 | 90 days supply | Qty: 90 | Fill #1

## 2018-12-19 MED FILL — CARVEDILOL 25 MG TABLET: 25 | 90 days supply | Qty: 180 | Fill #4

## 2018-12-19 MED FILL — ATORVASTATIN 80 MG TABLET: 80 | 90 days supply | Qty: 90 | Fill #1

## 2018-12-26 ENCOUNTER — Ambulatory Visit: Payer: No Typology Code available for payment source | Admitting: Cardiology

## 2018-12-26 ENCOUNTER — Encounter: Payer: Self-pay | Admitting: Cardiology

## 2018-12-26 VITALS — BP 108/68 | HR 70 | Ht 72.0 in | Wt 175.0 lb

## 2018-12-26 DIAGNOSIS — Z951 Presence of aortocoronary bypass graft: Secondary | ICD-10-CM | POA: Diagnosis not present

## 2018-12-26 DIAGNOSIS — E785 Hyperlipidemia, unspecified: Secondary | ICD-10-CM | POA: Diagnosis not present

## 2018-12-26 DIAGNOSIS — I255 Ischemic cardiomyopathy: Secondary | ICD-10-CM | POA: Diagnosis not present

## 2018-12-26 DIAGNOSIS — I252 Old myocardial infarction: Secondary | ICD-10-CM | POA: Diagnosis not present

## 2018-12-26 DIAGNOSIS — I351 Nonrheumatic aortic (valve) insufficiency: Secondary | ICD-10-CM

## 2018-12-26 NOTE — Patient Instructions (Signed)
Medication Instructions:  Your physician recommends that you continue on your current medications as directed. Please refer to the Current Medication list given to you today. If you need a refill on your cardiac medications before your next appointment, please call your pharmacy.   Lab work: None  If you have labs (blood work) drawn today and your tests are completely normal, you will receive your results only by: [name] MyChart Message (if you have MyChart) OR . A paper copy in the mail If you have any lab test that is abnormal or we need to change your treatment, we will call you to review the results.  Testing/Procedures: None   Follow-Up: At [facility], you and your health needs are our priority.  As part of our continuing mission to provide you with exceptional heart care, we have created designated Provider Care Teams.  These Care Teams include your primary Cardiologist (physician) and Advanced Practice Providers (APPs -  Physician Assistants and Nurse Practitioners) who all work together to provide you with the care you need, when you need it. You will need a follow up appointment in 6 months.  Please call our office 2 months in advance to schedule this appointment.  You may see Dr Quay Burow or one of the following Advanced Practice Providers on your designated Care Team:   [name], PA-C Roby Lofts, [place] . Sande Rives, PA-C  Any Other Special Instructions Will Be Listed Below (If Applicable).

## 2018-12-26 NOTE — Progress Notes (Signed)
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 In July 2017 he developed chest pain at work and was sent to the ED where he was diagnosed with an anterior STEMI.  He was taken tot he cath lab and had severe multivessel CAD including an 80% LM and severe LVD.  He was taken to the OR for urgent CABG x 4 by Dr Cyndia Bent. EF by echo on 06/26/16 was 30-35%.  He has done well since.  Subsequent echos have shown improvement in his LVF- 40-45% (last echo was April 2019).  Labs done by his PCP in Aug 2019 showed normal renal function, normal LFTs, and an LDL of 62.  He is in the office today for routine follow up. He has done well since we saw him last.  He admits he gets occasional chest discomfort if he is carrying something heavy at work.  He says this is "occasional" and not always with exertion.    Current Outpatient Medications  Medication Sig Dispense Refill  . aspirin 81 MG tablet Take 81 mg by mouth daily.    [name] atorvastatin (LIPITOR) 80 MG tablet TAKE 1 TABLET BY MOUTH DAILY AT 6 PM. 30 tablet 9  . carvedilol (COREG) 25 MG tablet TAKE 1 TABLET BY MOUTH TWICE DAILY WITH A MEAL. KEEP OFFICE VISIT. 60 tablet 11  . esomeprazole (NEXIUM) 40 MG capsule Take 40 mg by mouth daily as needed (reflux).    [name] lisinopril (PRINIVIL,ZESTRIL) 5 MG tablet Take 1 tablet (5 mg total) by mouth daily. 90 tablet 2  . Multiple Vitamin (MULTIVITAMINS PO) Take 1 tablet by mouth daily.    [name] topiramate (TOPAMAX) 100 MG tablet Take 100 mg by mouth at bedtime.    [name] VITAMIN D, ERGOCALCIFEROL, PO Take 5,000 Units by mouth daily.     No current facility-administered medications for this visit.     No Known Allergies  Past Medical History:  Diagnosis Date  . Cancer - prostate   . GERD (gastroesophageal reflux disease)     . Migraine     Social History   Socioeconomic History  . Marital status: Married    Spouse name: Not on file  . Number of children: 3  . Years of education: Not on file  . Highest education level: Not on file  Occupational History  . 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 Financial resource strain: Not on file  . Food insecurity:    Worry: Not on file    Inability: Not on file  . Transportation needs:    Medical: Not on file    Non-medical: Not on file  Tobacco Use  . Smoking status: Never Smoker  . Smokeless tobacco: Never Used  Substance and Sexual Activity  . Alcohol use: No  . Drug use: No  . Sexual activity: Not on file  Lifestyle  . Physical activity:    Days per week: Not on file    Minutes per session: Not on file  . Stress: Not on file  Relationships  . Social connections:    Talks on phone: Not on file    Gets together: Not on file    Attends religious service: Not on file  Active member of club or organization: Not on file    Attends meetings of clubs or organizations: Not on file    Relationship status: Not on file  . Intimate partner violence:    Fear of current or ex partner: Not on file    Emotionally abused: Not on file    Physically abused: Not on file    Forced sexual activity: Not on file  Other Topics Concern  . Not on file  Social History Narrative   Daily caffeine     Family History  Problem Relation Age of Onset  . Pancreatic cancer Mother   . Diabetes Mother   . Emphysema Father   . Colon polyps Brother   . CAD Brother 73       MI, he was a heavy smoker  . Colon cancer Neg Hx      Review of Systems: General: negative for chills, fever, night sweats or weight changes.  Cardiovascular: negative for dyspnea on exertion, edema, orthopnea, palpitations, paroxysmal nocturnal dyspnea or shortness of breath Dermatological: negative for rash Respiratory: negative for cough or wheezing Urologic:  negative for hematuria Abdominal: negative for nausea, vomiting, diarrhea, bright red blood per rectum, melena, or hematemesis Neurologic: negative for visual changes, syncope, or dizziness All other systems reviewed and are otherwise negative except as noted above.    Blood pressure 108/68, pulse 70, height 6' (1.829 m), weight 175 lb (79.4 kg).  General appearance: alert, cooperative and no distress Neck: no carotid bruit and no JVD Lungs: clear to auscultation bilaterally Heart: regular rate and rhythm Extremities: no edema Skin: Skin color, texture, turgor normal. No rashes or lesions Neurologic: Grossly normal  EKG NSR, Q V2, V3, TWI V4-V6 (old)  ASSESSMENT AND PLAN:   History of STEMI of anterior wall, 06/25/16 STEMI at work-(works at [employer] in [place])  S/P CABG x 4 Urgent CABG x 4 with LIMA-LAD, SVG-OM, SVG-PD/PL secondary to severe multivessel CAD including 80% LM  Cardiomyopathy, ischemic EF 25% pre op, 30-35% post op- most recent echo shows EF 40-45%  Dyslipidemia LDL 62- followed by PCP   PLAN  I offered to add low dose Imdur but he declined saying his symptoms were infrequent.  He'll let us know if this changes.  F/U 6 months with Dr Gwenlyn Found.   [name] PA-C 12/26/2018 4:20 PM

## 2019-02-21 ENCOUNTER — Other Ambulatory Visit: Payer: Self-pay | Admitting: Cardiovascular Disease

## 2019-02-21 MED FILL — LISINOPRIL 5 MG TABLET: 5 | 90 days supply | Qty: 90 | Fill #0

## 2019-02-21 MED FILL — TOPIRAMATE 100 MG TABLET: 100 | 90 days supply | Qty: 90 | Fill #2

## 2019-03-31 MED FILL — CARVEDILOL 25 MG TABLET: 25 | 30 days supply | Qty: 60 | Fill #5

## 2019-03-31 MED FILL — ATORVASTATIN 80 MG TABLET: 80 | 90 days supply | Qty: 90 | Fill #2

## 2019-05-09 ENCOUNTER — Other Ambulatory Visit: Payer: Self-pay | Admitting: Cardiovascular Disease

## 2019-05-09 MED FILL — CARVEDILOL 25 MG TABLET: 25 | 90 days supply | Qty: 180 | Fill #0

## 2019-05-29 MED FILL — TOPIRAMATE 100 MG TABLET: 100 | 90 days supply | Qty: 90 | Fill #3

## 2019-05-29 MED FILL — LISINOPRIL 5 MG TABLET: 5 | 90 days supply | Qty: 90 | Fill #1

## 2019-06-19 ENCOUNTER — Telehealth: Payer: Self-pay

## 2019-06-19 NOTE — Telephone Encounter (Signed)
Pt appointment moved from 7/22 OV to 7/24 @ 2pm VV. Pt aware of how it will work and cell phone number verified. Pt aware will need to have VS and medication ready to review with when we call 30 minutes before appointment.

## 2019-06-21 ENCOUNTER — Ambulatory Visit: Payer: No Typology Code available for payment source | Admitting: Cardiovascular Disease

## 2019-06-22 ENCOUNTER — Telehealth: Payer: Self-pay | Admitting: Cardiovascular Disease

## 2019-06-22 NOTE — Telephone Encounter (Signed)
Virtual Visit Pre-Appointment Phone Call  "(Name), I am calling you today to discuss your upcoming appointment. We are currently trying to limit exposure to the virus that causes COVID-19 by seeing patients at home rather than in the office."   1. Confirm consent - "In the setting of the current Covid19 crisis, you are scheduled for a (phone or video) visit with your provider on (date) at (time).  Just as we do with many in-office visits, in order for you to participate in this visit, we must obtain consent.  If you'd like, I can send this to your mychart (if signed up) or email for you to review.  Otherwise, I can obtain your verbal consent now.  All virtual visits are billed to your insurance company just like a normal visit would be.  By agreeing to a virtual visit, we'd like you to understand that the technology does not allow for your provider to perform an examination, and thus may limit your provider's ability to fully assess your condition. If your provider identifies any concerns that need to be evaluated in person, we will make arrangements to do so.  Finally, though the technology is pretty good, we cannot assure that it will always work on either your or our end, and in the setting of a video visit, we may have to convert it to a phone-only visit.  In either situation, we cannot ensure that we have a secure connection.  Are you willing to proceed?" STAFF: Did the patient verbally acknowledge consent to telehealth visit? Document YES/NO here: Yes  icians, physician assistants, nurse practitioners or other licensed health care professionals (the Practitioner), to provide me with telemedicine health care services (the Services") as deemed necessary by the treating Practitioner. I acknowledge and consent to receive the Services by the Practitioner via telemedicine. I understand that the telemedicine visit will involve communicating with the Practitioner through live audiovisual communication  technology and the disclosure of certain medical information by electronic transmission. I acknowledge that I have been given the opportunity to request an in-person assessment or other available alternative prior to the telemedicine visit and am voluntarily participating in the telemedicine visit.  I understand that I have the right to withhold or withdraw my consent to the use of telemedicine in the course of my care at any time, without affecting my right to future care or treatment, and that the Practitioner or I may terminate the telemedicine visit at any time. I understand that I have the right to inspect all information obtained and/or recorded in the course of the telemedicine visit and may receive copies of available information for a reasonable fee.  I understand that some of the potential risks of receiving the Services via telemedicine include:   Delay or interruption in medical evaluation due to technological equipment failure or disruption;  Information transmitted may not be sufficient (e.g. poor resolution of images) to allow for appropriate medical decision making by the Practitioner; and/or   In rare instances, security protocols could fail, causing a breach of personal health information.  Furthermore, I acknowledge that it is my responsibility to provide information about my medical history, conditions and care that is complete and accurate to the best of my ability. I acknowledge that Practitioner's advice, recommendations, and/or decision may be based on factors not within their control, such as incomplete or inaccurate data provided by me or distortions of diagnostic images or specimens that may result from electronic transmissions. I understand that the practice  of medicine is not an Chief Strategy Officer and that Practitioner makes no warranties or guarantees regarding treatment outcomes. I acknowledge that I will receive a copy of this consent concurrently upon execution via email to the  email address I last provided but may also request a printed copy by calling the office of [place].    I understand that my insurance will be billed for this visit.   I have read or had this consent read to me.  I understand the contents of this consent, which adequately explains the benefits and risks of the Services being provided via telemedicine.   I have been provided ample opportunity to ask questions regarding this consent and the Services and have had my questions answered to my satisfaction.  I give my informed consent for the services to be provided through the use of telemedicine in my medical care  By participating in this telemedicine visit I agree to the above.

## 2019-06-23 ENCOUNTER — Telehealth (INDEPENDENT_AMBULATORY_CARE_PROVIDER_SITE_OTHER): Payer: No Typology Code available for payment source | Admitting: Cardiovascular Disease

## 2019-06-23 ENCOUNTER — Telehealth: Payer: Self-pay

## 2019-06-23 DIAGNOSIS — I252 Old myocardial infarction: Secondary | ICD-10-CM

## 2019-06-23 DIAGNOSIS — Z951 Presence of aortocoronary bypass graft: Secondary | ICD-10-CM

## 2019-06-23 DIAGNOSIS — I351 Nonrheumatic aortic (valve) insufficiency: Secondary | ICD-10-CM | POA: Diagnosis not present

## 2019-06-23 DIAGNOSIS — I10 Essential (primary) hypertension: Secondary | ICD-10-CM

## 2019-06-23 DIAGNOSIS — I255 Ischemic cardiomyopathy: Secondary | ICD-10-CM | POA: Diagnosis not present

## 2019-06-23 DIAGNOSIS — E785 Hyperlipidemia, unspecified: Secondary | ICD-10-CM

## 2019-06-23 NOTE — Patient Instructions (Signed)

## 2019-06-23 NOTE — Progress Notes (Signed)
Virtual Visit via Video Note   This visit type was conducted due to national recommendations for restrictions regarding the COVID-19 Pandemic (e.g. social distancing) in an effort to limit this patient's exposure and mitigate transmission in our community.  Due to his co-morbid illnesses, this patient is at least at moderate risk for complications without adequate follow up.  This format is felt to be most appropriate for this patient at this time.  All issues noted in this document were discussed and addressed.  A limited physical exam was performed with this format.  Please refer to the patient's chart for his consent to telehealth for [facility].   Date:  06/23/2019   ID:  Benjamin Robbins, DOB 04/14/1956, MRN [id number]  Patient Location: Other:  His place of work Provider Location: Home  PCP:  Lawerance Cruel, MD  Cardiologist: Dr. Quay Burow Electrophysiologist:  None   Evaluation Performed:  Follow-Up Visit  Chief Complaint: Follow-up CAD  History of Present Illness:    Benjamin Robbins is a 62 y.o.  thin-appearing married Caucasian male father of 4, grandfather 6 grandchildren who I last saw in the office  03/29/2018.  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  He has no prior cardiac history or risk factors other than premature family history. I last saw him in the office 09/01/16. 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 He developed substernal chest pain and was brought urgently to the emergency room where an EKG showed anterior ST segment elevation with reciprocal inferior depression. He was treated with aspirin and IV nitroglycerin was brought emergently to the Cath Lab where I performed cardiac catheterization revealing left main/three-vessel disease with moderately severe LV dysfunction (EF 25-30%). He underwent emergent coronary artery bypass grafting X 4 by Dr. Cyndia Bent with a  LIMA to the LAD, vein to obtuse marginal branch and sequential vein to the PDA and PLA. He was discharged home on 06/30/16. He has been doing well and recuperating nicely. He finished a 12 week, outpatient cardiac rehabilitation program. He currently is a symptomatic. He did have a 2-D echocardiogram performed 10/13/16 revealing an ejection fraction of 40-45%, moderately improved compared to his EF at the time of his STEMI which was 25-30%. When I saw him in the office 4 months ago he was complaining of chest pain on a weekly basis which has improved markedly in frequency and severity. A follow-up 2-D echo performed 03/02/17 revealed an EF of 40-45%, similar to his prior echo with moderate aortic insufficiency.  His most recent echo however performed 03/11/2018 revealed similar EF with only mild AI.  He is relatively asymptomatic.  Since I saw him a year ago he is done well remained stable.  He did see [name] back 12/26/2018.  He denies chest pain or shortness of breath.  He has an excellent lipid profile.  Recent blood pressure measured several days ago was 136/78.    The patient does not have symptoms concerning for COVID-19 infection (fever, chills, cough, or new shortness of breath).    Past Medical History:  Diagnosis Date   Cancer - prostate    GERD (gastroesophageal reflux disease)    Migraine    Past Surgical History:  Procedure Laterality Date   CARDIAC CATHETERIZATION [name] 06/25/2016   Procedure: Left Heart Cath and Coronary Angiography;  Surgeon: Lorretta Harp, MD;  Location: [facility];  Service: Cardiovascular;  Laterality: [name];   CORONARY ARTERY BYPASS GRAFT [name] 06/25/2016   Procedure: CORONARY ARTERY BYPASS GRAFTING (CABG) x (LIMA to LAD, SVG to OM, SVG SEQUENTIALLY to PDA and PLB) with EVH from RIGHT THIGH and LOWER LEG;  Surgeon: Gaye Pollack, MD;  Location: [place];  Service: Open Heart Surgery;  Laterality: [name];   MOLE REMOVAL     off neck   PROSTATECTOMY   11-2008     No outpatient medications have been marked as taking for the 06/23/19 encounter (Appointment) with Lorretta Harp, MD.     Allergies:   Patient has no known allergies.   Social History   Tobacco Use   Smoking status: Never Smoker   Smokeless tobacco: Never Used  Substance Use Topics   Alcohol use: No   Drug use: No     Family Hx: The patient's family history includes CAD (age of onset: 15) in his brother; Colon polyps in his brother; Diabetes in his mother; Emphysema in his father; Pancreatic cancer in his mother. There is no history of Colon cancer.  ROS:   Please see the history of present illness.     All other systems reviewed and are negative.   Prior CV studies:   The following studies were reviewed today:  2D echo 03/11/2018  Labs/Other Tests and Data Reviewed:    EKG:  No ECG reviewed.  Recent Labs: No results found for requested labs within last 8760 hours.   Recent Lipid Panel Lab Results  Component Value Date/Time   CHOL 105 08/19/2017 08:37 AM   TRIG 62 08/19/2017 08:37 AM   HDL 36 (L) 08/19/2017 08:37 AM   CHOLHDL 2.9 08/19/2017 08:37 AM   CHOLHDL 3.4 09/01/2016 08:39 AM   LDLCALC 57 08/19/2017 08:37 AM    Wt Readings from Last 3 Encounters:  12/26/18 175 lb (79.4 kg)  10/24/18 174 lb 6.4 oz (79.1 kg)  03/29/18 174 lb (78.9 kg)     Objective:    Vital Signs:  There were no vitals taken for this visit.   VITAL SIGNS:  reviewed GEN:  no acute distress EYES:  sclerae anicteric, EOMI - Extraocular Movements Intact RESPIRATORY:  normal respiratory effort, symmetric expansion MUSCULOSKELETAL:  no obvious deformities. NEURO:  alert and oriented x 3, no obvious focal deficit PSYCH:  normal affect  ASSESSMENT & PLAN:    1. Coronary artery disease- history of CAD status post anterior STEMI 06/25/2016 with urgent cath revealing left main/three-vessel disease.  He had urgent CABG x4 by Dr. Cyndia Bent with a LIMA to his LAD, vein to  the obtuse marginal branch and sequential vein to the PDA and PLA.  He denies chest pain or shortness of breath. 2. Essential hypertension- history of essential hypertension with blood pressure measured several days ago 136/78.  He is on carvedilol and lisinopril 3. Hyperlipidemia- history of hyperlipidemia on high-dose atorvastatin with lipid profile performed 07/08/2018 revealing a total cholesterol 110, LDL 62 and HDL 34 4. Ischemic cardiomyopathy- history of ischemic cardiomyopathy with the EF of 40 to 45% by 2D echo 03/11/2018 with mild aortic insufficiency.  COVID-19 Education: The signs and symptoms of COVID-19 were discussed with the patient and how to seek care for testing (follow up with PCP or arrange E-visit).  The importance of social distancing was discussed today.  Time:   Today, I have spent 4 minutes with the patient with telehealth technology discussing the above problems.     Medication Adjustments/Labs and Tests  Ordered: Current medicines are reviewed at length with the patient today.  Concerns regarding medicines are outlined above.   Tests Ordered: No orders of the defined types were placed in this encounter.   Medication Changes: No orders of the defined types were placed in this encounter.   Follow Up:  In Person in 1 year(s)  Signed, Quay Burow, MD  06/23/2019 12:51 PM    [place]

## 2019-06-23 NOTE — Telephone Encounter (Signed)
Patient and/or DPR-approved person aware of 7/24 AVS instructions and verbalized understanding.  Letter including After Visit Summary and any other necessary documents to be mailed to the patient's address on file.  

## 2019-06-30 ENCOUNTER — Ambulatory Visit: Payer: No Typology Code available for payment source | Admitting: Cardiovascular Disease

## 2019-07-10 MED FILL — ATORVASTATIN 80 MG TABLET: 80 | 30 days supply | Qty: 30 | Fill #3

## 2019-07-28 MED FILL — PANTOPRAZOLE SOD DR 40 MG T: 40 | 90 days supply | Qty: 90 | Fill #0

## 2019-08-09 ENCOUNTER — Other Ambulatory Visit: Payer: Self-pay | Admitting: Cardiovascular Disease

## 2019-08-09 MED FILL — CARVEDILOL 25 MG TABLET: 25 | 90 days supply | Qty: 180 | Fill #1

## 2019-08-09 MED FILL — ATORVASTATIN 80 MG TABLET: 80 | 90 days supply | Qty: 90 | Fill #0

## 2019-08-25 ENCOUNTER — Other Ambulatory Visit: Payer: Self-pay | Admitting: Cardiovascular Disease

## 2019-08-25 MED FILL — TOPIRAMATE 100 MG TABLET: 100 | 90 days supply | Qty: 90 | Fill #0

## 2019-08-25 MED FILL — LISINOPRIL 5 MG TABLET: 5 | 90 days supply | Qty: 90 | Fill #0

## 2019-11-10 MED FILL — TOPIRAMATE 100 MG TABLET: 100 | 90 days supply | Qty: 90 | Fill #1

## 2019-11-10 MED FILL — CARVEDILOL 25 MG TABLET: 25 | 90 days supply | Qty: 180 | Fill #2

## 2019-11-10 MED FILL — ATORVASTATIN 80 MG TABLET: 80 | 90 days supply | Qty: 90 | Fill #1

## 2019-11-10 MED FILL — PANTOPRAZOLE SOD DR 40 MG T: 40 | 90 days supply | Qty: 90 | Fill #1

## 2019-12-04 MED FILL — LISINOPRIL 5 MG TABLET: 5 | 90 days supply | Qty: 90 | Fill #1

## 2020-01-04 ENCOUNTER — Ambulatory Visit (INDEPENDENT_AMBULATORY_CARE_PROVIDER_SITE_OTHER): Payer: No Typology Code available for payment source | Admitting: Cardiovascular Disease

## 2020-01-04 ENCOUNTER — Encounter: Payer: Self-pay | Admitting: Cardiovascular Disease

## 2020-01-04 ENCOUNTER — Other Ambulatory Visit: Payer: Self-pay

## 2020-01-04 VITALS — BP 147/80 | HR 69 | Ht 72.0 in | Wt 175.0 lb

## 2020-01-04 DIAGNOSIS — E785 Hyperlipidemia, unspecified: Secondary | ICD-10-CM

## 2020-01-04 DIAGNOSIS — I255 Ischemic cardiomyopathy: Secondary | ICD-10-CM | POA: Diagnosis not present

## 2020-01-04 DIAGNOSIS — I252 Old myocardial infarction: Secondary | ICD-10-CM | POA: Diagnosis not present

## 2020-01-04 DIAGNOSIS — I25110 Atherosclerotic heart disease of native coronary artery with unstable angina pectoris: Secondary | ICD-10-CM

## 2020-01-04 DIAGNOSIS — I351 Nonrheumatic aortic (valve) insufficiency: Secondary | ICD-10-CM

## 2020-01-04 NOTE — Patient Instructions (Signed)
Medication Instructions:  NO CHANGE *If you need a refill on your cardiac medications before your next appointment, please call your pharmacy*  Lab Work: If you have labs (blood work) drawn today and your tests are completely normal, you will receive your results only by: [name] MyChart Message (if you have MyChart) OR . A paper copy in the mail If you have any lab test that is abnormal or we need to change your treatment, we will call you to review the results.  Follow-Up: At [facility], you and your health needs are our priority.  As part of our continuing mission to provide you with exceptional heart care, we have created designated Provider Care Teams.  These Care Teams include your primary Cardiologist (physician) and Advanced Practice Providers (APPs -  Physician Assistants and Nurse Practitioners) who all work together to provide you with the care you need, when you need it.  Your next appointment:   12 month(s)  The format for your next appointment:   Either In Person or Virtual  Provider:   You may see Quay Burow MD or one of the following Advanced Practice Providers on your designated Care Team:    [name], PA-C  [place], [place]  Coletta Memos, [place]

## 2020-01-04 NOTE — Assessment & Plan Note (Signed)
History of dyslipidemia on high-dose statin therapy with lipid profile performed 08/17/2019 revealing total cholesterol 106, LDL 56 and HDL 39

## 2020-01-04 NOTE — Assessment & Plan Note (Signed)
History of aortic insufficiency with his last echo performed 03/11/2018 revealing this to be mild.

## 2020-01-04 NOTE — Progress Notes (Signed)
01/04/2020 Benjamin Robbins   1956/05/29  [id number]  Primary Physician Lawerance Cruel, MD Primary Cardiologist: Lorretta Harp MD FACP, [place], [place], [place]  HPI:  Benjamin Robbins is a 64 y.o.  thin-appearing married Caucasian male father of 60, grandfather 6 grandchildren who I last saw virtually by video televisit 06/23/2019.  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  He has no prior cardiac history or risk factors other than premature family history. I last saw him in the office 09/01/16. 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 He developed substernal chest pain and was brought urgently to the emergency room where an EKG showed anterior ST segment elevation with reciprocal inferior depression. He was treated with aspirin and IV nitroglycerin was brought emergently to the Cath Lab where I performed cardiac catheterization revealing left main/three-vessel disease with moderately severe LV dysfunction (EF 25-30%). He underwent emergent coronary artery bypass grafting X 4 by Dr. Cyndia Bent with a LIMA to the LAD, vein to obtuse marginal branch and sequential vein to the PDA and PLA. He was discharged home on 06/30/16. He has been doing well and recuperating nicely. He finished a 12 week, outpatient cardiac rehabilitation program. He currently is a symptomatic. He did have a 2-D echocardiogram performed 10/13/16 revealing an ejection fraction of 40-45%, moderately improved compared to his EF at the time of his STEMI which was 25-30%. When I saw him in the office 4 months ago he was complaining of chest pain on a weekly basis which has improved markedly in frequency and severity. A follow-up 2-D echo performed 03/02/17 revealed an EF of 40-45%, similar to his prior echo with moderate aortic insufficiency.His most recent echo however performed 03/11/2018 revealed similar EF with only mild AI. He is relatively  asymptomatic.  Since I saw him virtually 6 months ago he continues to do well.  His major complaint is just of fatigue late in the morning but denies chest pain or shortness of breath.  Current Meds  Medication Sig  . aspirin 81 MG tablet Take 81 mg by mouth daily.  [name] atorvastatin (LIPITOR) 80 MG tablet TAKE 1 TABLET BY MOUTH DAILY AT 6 PM.  . carvedilol (COREG) 25 MG tablet Take 1 tablet (25 mg total) by mouth 2 (two) times daily with a meal.  . esomeprazole (NEXIUM) 40 MG capsule Take 40 mg by mouth daily as needed (reflux).  [name] lisinopril (ZESTRIL) 5 MG tablet TAKE 1 TABLET (5 MG TOTAL) BY MOUTH DAILY.  . Multiple Vitamin (MULTIVITAMINS PO) Take 1 tablet by mouth daily.  [name] topiramate (TOPAMAX) 100 MG tablet Take 100 mg by mouth at bedtime.  [name] VITAMIN D, ERGOCALCIFEROL, PO Take 5,000 Units by mouth daily.     No Known Allergies  Social History   Socioeconomic History  . Marital status: Married    Spouse name: Not on file  . Number of children: 3  . Years of education: Not on file  . Highest education level: Not on file  Occupational History  . 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 Smoking status: Never Smoker  . Smokeless tobacco: Never Used  Substance and Sexual Activity  . Alcohol use: No  . Drug use: No  . Sexual activity: Not on file  Other Topics Concern  . Not on file  Social History Narrative   Daily  caffeine   Social Determinants of Health   Financial Resource Strain:   . Difficulty of Paying Living Expenses: Not on file  Food Insecurity:   . Worried About Charity fundraiser in the Last Year: Not on file  . Ran Out of Food in the Last Year: Not on file  Transportation Needs:   . Lack of Transportation (Medical): Not on file  . Lack of Transportation (Non-Medical): Not on file  Physical Activity:   . Days of Exercise per Week: Not on file  . Minutes of Exercise per Session: Not on file  Stress:   . Feeling of Stress : Not on  file  Social Connections:   . Frequency of Communication with Friends and Family: Not on file  . Frequency of Social Gatherings with Friends and Family: Not on file  . Attends Religious Services: Not on file  . Active Member of Clubs or Organizations: Not on file  . Attends Archivist Meetings: Not on file  . Marital Status: Not on file  Intimate Partner Violence:   . Fear of Current or Ex-Partner: Not on file  . Emotionally Abused: Not on file  . Physically Abused: Not on file  . Sexually Abused: Not on file     Review of Systems: General: negative for chills, fever, night sweats or weight changes.  Cardiovascular: negative for chest pain, dyspnea on exertion, edema, orthopnea, palpitations, paroxysmal nocturnal dyspnea or shortness of breath Dermatological: negative for rash Respiratory: negative for cough or wheezing Urologic: negative for hematuria Abdominal: negative for nausea, vomiting, diarrhea, bright red blood per rectum, melena, or hematemesis Neurologic: negative for visual changes, syncope, or dizziness All other systems reviewed and are otherwise negative except as noted above.    Blood pressure (!) 147/80, pulse 69, height 6' (1.829 m), weight 175 lb (79.4 kg), SpO2 100 %.  General appearance: alert and no distress Neck: no adenopathy, no carotid bruit, no JVD, supple, symmetrical, trachea midline and thyroid not enlarged, symmetric, no tenderness/mass/nodules Lungs: clear to auscultation bilaterally Heart: regular rate and rhythm, S1, S2 normal, no murmur, click, rub or gallop Extremities: extremities normal, atraumatic, no cyanosis or edema Pulses: 2+ and symmetric Skin: Skin color, texture, turgor normal. No rashes or lesions Neurologic: Alert and oriented X 3, normal strength and tone. Normal symmetric reflexes. Normal coordination and gait  EKG sinus rhythm at 69 with a rightward axis and nonspecific ST and T wave changes.  Does have Q waves in leads  V1 through V3.  I personally reviewed this EKG.   ASSESSMENT AND PLAN:   History of ST elevation myocardial infarction (STEMI) History of anterior STEMI 06/25/2016 at 530 afternoon while at work [employer].  Is brought urgently to the Cath Lab where I demonstrated that he had left main/three-vessel disease with severe LV dysfunction EF 25 to 30%, and he underwent emergent CABG x4 by Dr. Cyndia Bent with a LIMA to his LAD, vein to the marginal branch, sequential vein to the PDA and PLA.  He was discharged home on 06/30/2016.  His EF improved to 40 to 45% by 2D echo 10/13/2016.  He said no recurrent symptoms.  Cardiomyopathy, ischemic History of ischemic cardiomyopathy with 2D echo revealing EF of 40 to 45% performed 03/11/2018.  He has no symptoms of heart failure and is on appropriate medications including carvedilol and lisinopril.  Dyslipidemia History of dyslipidemia on high-dose statin therapy with lipid profile performed 08/17/2019 revealing total cholesterol 106, LDL 56 and HDL  39  Moderate aortic insufficiency History of aortic insufficiency with his last echo performed 03/11/2018 revealing this to be mild.      Lorretta Harp MD FACP,FACC,FAHA, [facility] 01/04/2020 2:52 PM

## 2020-01-04 NOTE — Assessment & Plan Note (Signed)
History of anterior STEMI 06/25/2016 at 530 afternoon while at work [employer].  Is brought urgently to the Cath Lab where I demonstrated that he had left main/three-vessel disease with severe LV dysfunction EF 25 to 30%, and he underwent emergent CABG x4 by Dr. Cyndia Bent with a LIMA to his LAD, vein to the marginal branch, sequential vein to the PDA and PLA.  He was discharged home on 06/30/2016.  His EF improved to 40 to 45% by 2D echo 10/13/2016.  He said no recurrent symptoms.

## 2020-01-04 NOTE — Assessment & Plan Note (Signed)
History of ischemic cardiomyopathy with 2D echo revealing EF of 40 to 45% performed 03/11/2018.  He has no symptoms of heart failure and is on appropriate medications including carvedilol and lisinopril.

## 2020-01-16 ENCOUNTER — Other Ambulatory Visit (HOSPITAL_BASED_OUTPATIENT_CLINIC_OR_DEPARTMENT_OTHER): Payer: Self-pay | Admitting: Family Medicine

## 2020-01-22 MED FILL — PANTOPRAZOLE SOD DR 40 MG T: 40 | 90 days supply | Qty: 90 | Fill #0

## 2020-01-31 MED FILL — TOPIRAMATE 100 MG TABLET: 100 | 90 days supply | Qty: 90 | Fill #2

## 2020-01-31 MED FILL — PANTOPRAZOLE SOD DR 40 MG T: 40 | 90 days supply | Qty: 90 | Fill #0

## 2020-02-13 MED FILL — CARVEDILOL 25 MG TABLET: 25 | 90 days supply | Qty: 180 | Fill #3

## 2020-02-13 MED FILL — ATORVASTATIN 80 MG TABLET: 80 | 90 days supply | Qty: 90 | Fill #2

## 2020-03-06 MED FILL — LISINOPRIL 5 MG TABLET: 5 | 90 days supply | Qty: 90 | Fill #2

## 2020-05-22 ENCOUNTER — Other Ambulatory Visit: Payer: Self-pay | Admitting: Cardiovascular Disease

## 2020-05-22 MED FILL — TOPIRAMATE 100 MG TABLET: 100 | 90 days supply | Qty: 90 | Fill #3

## 2020-05-22 MED FILL — CARVEDILOL 25 MG TABLET: 25 | 90 days supply | Qty: 180 | Fill #0

## 2020-05-22 MED FILL — ATORVASTATIN 80 MG TABLET: 80 | 90 days supply | Qty: 90 | Fill #3

## 2020-06-06 ENCOUNTER — Other Ambulatory Visit: Payer: Self-pay | Admitting: Cardiovascular Disease

## 2020-06-06 MED FILL — LISINOPRIL 5 MG TABLET: 5 | 90 days supply | Qty: 90 | Fill #0

## 2020-06-06 MED FILL — PANTOPRAZOLE SOD DR 40 MG T: 40 | 90 days supply | Qty: 90 | Fill #1

## 2020-08-07 ENCOUNTER — Other Ambulatory Visit: Payer: Self-pay | Admitting: Cardiovascular Disease

## 2020-08-07 MED FILL — ATORVASTATIN 80 MG TABLET: 80 | 90 days supply | Qty: 90 | Fill #0

## 2020-08-13 MED FILL — TOPIRAMATE 100 MG TABLET: 100 | 90 days supply | Qty: 90 | Fill #0

## 2020-09-03 MED FILL — CARVEDILOL 25 MG TABLET: 25 | 90 days supply | Qty: 180 | Fill #1

## 2020-09-20 MED FILL — LISINOPRIL 5 MG TABLET: 5 | 90 days supply | Qty: 90 | Fill #1

## 2020-09-20 MED FILL — PANTOPRAZOLE SOD DR 40 MG T: 40 | 90 days supply | Qty: 90 | Fill #2

## 2020-11-25 MED FILL — TOPIRAMATE 100 MG TABLET: 100 | 90 days supply | Qty: 90 | Fill #1

## 2020-11-25 MED FILL — CARVEDILOL 25 MG TABLET: 25 | 90 days supply | Qty: 180 | Fill #2

## 2020-11-25 MED FILL — ATORVASTATIN 80 MG TABLET: 80 | 90 days supply | Qty: 90 | Fill #1

## 2020-12-10 MED FILL — LISINOPRIL 5 MG TABLET: 5 | 90 days supply | Qty: 90 | Fill #2

## 2020-12-10 MED FILL — PANTOPRAZOLE SOD DR 40 MG T: 40 | 90 days supply | Qty: 90 | Fill #3

## 2021-01-03 ENCOUNTER — Other Ambulatory Visit: Payer: Self-pay

## 2021-01-03 ENCOUNTER — Encounter: Payer: Self-pay | Admitting: Cardiovascular Disease

## 2021-01-03 ENCOUNTER — Ambulatory Visit (INDEPENDENT_AMBULATORY_CARE_PROVIDER_SITE_OTHER): Payer: No Typology Code available for payment source | Admitting: Cardiovascular Disease

## 2021-01-03 VITALS — BP 116/68 | HR 59 | Ht 72.0 in | Wt 179.0 lb

## 2021-01-03 DIAGNOSIS — I252 Old myocardial infarction: Secondary | ICD-10-CM

## 2021-01-03 DIAGNOSIS — E785 Hyperlipidemia, unspecified: Secondary | ICD-10-CM

## 2021-01-03 DIAGNOSIS — I255 Ischemic cardiomyopathy: Secondary | ICD-10-CM

## 2021-01-03 DIAGNOSIS — I25110 Atherosclerotic heart disease of native coronary artery with unstable angina pectoris: Secondary | ICD-10-CM | POA: Diagnosis not present

## 2021-01-03 DIAGNOSIS — I351 Nonrheumatic aortic (valve) insufficiency: Secondary | ICD-10-CM

## 2021-01-03 DIAGNOSIS — I10 Essential (primary) hypertension: Secondary | ICD-10-CM

## 2021-01-03 NOTE — Patient Instructions (Signed)
Medication Instructions:  Your physician recommends that you continue on your current medications as directed. Please refer to the Current Medication list given to you today.  *If you need a refill on your cardiac medications before your next appointment, please call your pharmacy*   Lab Work: Your physician recommends that you return for lab work in: 1-2 weeks for fasting lipid/liver profile.  If you have labs (blood work) drawn today and your tests are completely normal, you will receive your results only by: [name] MyChart Message (if you have MyChart) OR . A paper copy in the mail If you have any lab test that is abnormal or we need to change your treatment, we will call you to review the results.   Testing/Procedures: Your physician has requested that you have an echocardiogram. Echocardiography is a painless test that uses sound waves to create images of your heart. It provides your doctor with information about the size and shape of your heart and how well your heart's chambers and valves are working. This procedure takes approximately one hour. There are no restrictions for this procedure. This procedure is done at [address] 3rd Floor.   Follow-Up: At [facility], you and your health needs are our priority.  As part of our continuing mission to provide you with exceptional heart care, we have created designated Provider Care Teams.  These Care Teams include your primary Cardiologist (physician) and Advanced Practice Providers (APPs -  Physician Assistants and Nurse Practitioners) who all work together to provide you with the care you need, when you need it.  We recommend signing up for the patient portal called "MyChart".  Sign up information is provided on this After Visit Summary.  MyChart is used to connect with patients for Virtual Visits (Telemedicine).  Patients are able to view lab/test results, encounter notes, upcoming appointments, etc.  Non-urgent messages can be sent  to your provider as well.   To learn more about what you can do with MyChart, go to [email or web address].    Your next appointment:   12 month(s)  The format for your next appointment:   In Person  Provider:   Quay Burow, MD

## 2021-01-03 NOTE — Assessment & Plan Note (Signed)
History of ischemic cardiomyopathy with an EF in the 40 to 45% range.  He is on carvedilol and lisinopril.

## 2021-01-03 NOTE — Assessment & Plan Note (Signed)
History of mild to moderate aortic insufficiency with echo performed 03/11/2018 revealing EF of 40 to 45% with mild AI.  We will will repeat a 2D echocardiogram.

## 2021-01-03 NOTE — Progress Notes (Signed)
01/03/2021 Benjamin Robbins   1956-05-28  [id number]  Primary Physician Lawerance Cruel, MD Primary Cardiologist: Lorretta Harp MD [name], [place]  HPI:  Benjamin Robbins is a 65 y.o.   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 Hehas no prior cardiac history or risk factors other than premature family history. I last saw him in the office 09/01/16. 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 He developed substernal chest pain and was brought urgently to the emergency room where an EKG showed anterior ST segment elevation with reciprocal inferior depression. He was treated with aspirin and IV nitroglycerin was brought emergently to the Cath Lab where I performed cardiac catheterization revealing left main/three-vessel disease with moderately severe LV dysfunction (EF 25-30%). He underwent emergent coronary artery bypass grafting X 4 by Dr. Cyndia Bent with a LIMA to the LAD, vein to obtuse marginal branch and sequential vein to the PDA and PLA. He was discharged home on 06/30/16. He has been doing well and recuperating nicely. He finished a 12 week, outpatient cardiac rehabilitation program. He currently is a symptomatic. He did have a 2-D echocardiogram performed 10/13/16 revealing an ejection fraction of 40-45%, moderately improved compared to his EF at the time of his STEMI which was 25-30%. When I saw him in the office 4 months ago he was complaining of chest pain on a weekly basis which has improved markedly in frequency and severity. A follow-up 2-D echo performed 03/02/17 revealed an EF of 40-45%, similar to his prior echo with moderate aortic insufficiency.His most recent echo however performed 03/11/2018 revealed  similar EF with only mild AI. He is relatively asymptomatic.  Since I saw him  in the office a year ago he continues to do well.  He is not exercises as much as he did in the past.  He is asymptomatic however.   Current Meds  Medication Sig  . aspirin 81 MG tablet Take 81 mg by mouth daily.  [name] atorvastatin (LIPITOR) 80 MG tablet TAKE 1 TABLET BY MOUTH DAILY AT 6 PM.  . carvedilol (COREG) 25 MG tablet TAKE 1 TABLET (25 MG TOTAL) BY MOUTH 2 (TWO) TIMES DAILY WITH A MEAL.  [name] esomeprazole (NEXIUM) 40 MG capsule Take 40 mg by mouth daily as needed (reflux).  [name] lisinopril (ZESTRIL) 5 MG tablet TAKE 1 TABLET BY MOUTH ONCE DAILY  . Multiple Vitamin (MULTIVITAMINS PO) Take 1 tablet by mouth daily.  [name] topiramate (TOPAMAX) 100 MG tablet Take 100 mg by mouth at bedtime.  [name] VITAMIN D, ERGOCALCIFEROL, PO Take 5,000 Units by mouth daily.     No Known Allergies  Social History   Socioeconomic History  . Marital status: Married    Spouse name: Not on file  . Number of children: 3  . Years of education: Not on file  . Highest education level: Not on file  Occupational History  . 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 Smoking status: Never Smoker  . Smokeless tobacco: Never Used  Substance and Sexual Activity  . Alcohol use: No  . Drug use: No  . Sexual activity: Not on file  Other Topics Concern  . Not  on file  Social History Narrative   Daily caffeine   Social Determinants of Health   Financial Resource Strain: Not on file  Food Insecurity: Not on file  Transportation Needs: Not on file  Physical Activity: Not on file  Stress: Not on file  Social Connections: Not on file  Intimate Partner Violence: Not on file     Review of Systems: General: negative for chills, fever, night sweats or weight changes.  Cardiovascular: negative for chest pain, dyspnea on exertion, edema, orthopnea, palpitations, paroxysmal nocturnal dyspnea or shortness of  breath Dermatological: negative for rash Respiratory: negative for cough or wheezing Urologic: negative for hematuria Abdominal: negative for nausea, vomiting, diarrhea, bright red blood per rectum, melena, or hematemesis Neurologic: negative for visual changes, syncope, or dizziness All other systems reviewed and are otherwise negative except as noted above.    Blood pressure 116/68, pulse (!) 59, height 6' (1.829 m), weight 179 lb (81.2 kg).  General appearance: alert and no distress Neck: no adenopathy, no carotid bruit, no JVD, supple, symmetrical, trachea midline and thyroid not enlarged, symmetric, no tenderness/mass/nodules Lungs: clear to auscultation bilaterally Heart: regular rate and rhythm, S1, S2 normal, no murmur, click, rub or gallop Extremities: extremities normal, atraumatic, no cyanosis or edema Pulses: 2+ and symmetric Skin: Skin color, texture, turgor normal. No rashes or lesions Neurologic: Alert and oriented X 3, normal strength and tone. Normal symmetric reflexes. Normal coordination and gait  EKG sinus bradycardia 59 with septal Q waves and nonspecific ST and T wave changes.  I personally reviewed this EKG.  ASSESSMENT AND PLAN:   History of ST elevation myocardial infarction (STEMI) History of anterior STEMI with urgent cath that I performed 06/25/2016 at 5:30 in the afternoon.  He had left main/three-vessel disease with severe LV dysfunction.  He underwent urgent CABG x4 by Dr. Cyndia Bent LIMA to his LAD, vein to the marginal branch, sequential vein to the PDA and PLA.  He had he was discharged home 06/30/2016.  His EF marginally improved up to the 40 to 45% by 2D echo as recently as 03/11/2018.  He denies chest pain or shortness of breath.  Cardiomyopathy, ischemic History of ischemic cardiomyopathy with an EF in the 40 to 45% range.  He is on carvedilol and lisinopril.  Dyslipidemia History of dyslipidemia on statin therapy with lipid profile performed 01/16/2020  revealing total cholesterol 114, LDL 59 and HDL of 37.  We will repeat a fasting lipid and liver profile.  Moderate aortic insufficiency History of mild to moderate aortic insufficiency with echo performed 03/11/2018 revealing EF of 40 to 45% with mild AI.  We will will repeat a 2D echocardiogram.      Lorretta Harp MD [facility], [facility] 01/03/2021 10:45 AM

## 2021-01-03 NOTE — Assessment & Plan Note (Signed)
History of dyslipidemia on statin therapy with lipid profile performed 01/16/2020 revealing total cholesterol 114, LDL 59 and HDL of 37.  We will repeat a fasting lipid and liver profile.

## 2021-01-03 NOTE — Assessment & Plan Note (Signed)
History of anterior STEMI with urgent cath that I performed 06/25/2016 at 5:30 in the afternoon.  He had left main/three-vessel disease with severe LV dysfunction.  He underwent urgent CABG x4 by Dr. Cyndia Bent LIMA to his LAD, vein to the marginal branch, sequential vein to the PDA and PLA.  He had he was discharged home 06/30/2016.  His EF marginally improved up to the 40 to 45% by 2D echo as recently as 03/11/2018.  He denies chest pain or shortness of breath.

## 2021-01-09 LAB — HEPATIC FUNCTION PANEL
ALT: 24 IU/L (ref 0–44)
AST: 19 IU/L (ref 0–40)
Albumin: 4.4 g/dL (ref 3.8–4.8)
Alkaline Phosphatase: 95 IU/L (ref 44–121)
Bilirubin Total: 0.5 mg/dL (ref 0.0–1.2)
Bilirubin, Direct: 0.15 mg/dL (ref 0.00–0.40)
Total Protein: 6.5 g/dL (ref 6.0–8.5)

## 2021-01-09 LAB — LIPID PANEL
Chol/HDL Ratio: 2.6 ratio (ref 0.0–5.0)
Cholesterol, Total: 106 mg/dL (ref 100–199)
HDL: 41 mg/dL (ref >39)
LDL Chol Calc (NIH): 52 mg/dL (ref 0–99)
Triglycerides: 54 mg/dL (ref 0–149)
VLDL Cholesterol Cal: 13 mg/dL (ref 5–40)

## 2021-01-22 ENCOUNTER — Other Ambulatory Visit (HOSPITAL_BASED_OUTPATIENT_CLINIC_OR_DEPARTMENT_OTHER): Payer: Self-pay | Admitting: Family Medicine

## 2021-01-28 ENCOUNTER — Ambulatory Visit (HOSPITAL_COMMUNITY): Payer: No Typology Code available for payment source | Attending: Cardiovascular Disease

## 2021-01-28 ENCOUNTER — Other Ambulatory Visit: Payer: Self-pay

## 2021-01-28 DIAGNOSIS — I25110 Atherosclerotic heart disease of native coronary artery with unstable angina pectoris: Secondary | ICD-10-CM | POA: Diagnosis present

## 2021-01-28 DIAGNOSIS — I351 Nonrheumatic aortic (valve) insufficiency: Secondary | ICD-10-CM | POA: Diagnosis not present

## 2021-01-28 DIAGNOSIS — I252 Old myocardial infarction: Secondary | ICD-10-CM | POA: Insufficient documentation

## 2021-01-28 LAB — ECHOCARDIOGRAM COMPLETE
Area-P 1/2: 3.03 cm2
P 1/2 time: 489 msec
S' Lateral: 3.7 cm

## 2021-03-03 ENCOUNTER — Other Ambulatory Visit: Payer: Self-pay | Admitting: Cardiovascular Disease

## 2021-03-03 ENCOUNTER — Other Ambulatory Visit (HOSPITAL_COMMUNITY): Payer: Self-pay

## 2021-03-03 MED ORDER — PANTOPRAZOLE SODIUM 40 MG PO TBEC
40.0000 mg | DELAYED_RELEASE_TABLET | Freq: Every day | ORAL | 4 refills | Status: DC
  Filled 2021-03-03: qty 90, 90d supply, fill #0
  Filled 2021-06-11: qty 90, 90d supply, fill #1
  Filled 2021-09-10: qty 90, 90d supply, fill #2
  Filled 2021-12-10: qty 90, 90d supply, fill #3

## 2021-03-03 MED ORDER — CARVEDILOL 25 MG PO TABS
ORAL_TABLET | Freq: Two times a day (BID) | ORAL | 3 refills | Status: DC
  Filled 2021-03-03: qty 180, 90d supply, fill #0
  Filled 2021-06-11: qty 180, 90d supply, fill #1
  Filled 2021-09-10: qty 180, 90d supply, fill #2
  Filled 2021-12-10: qty 180, 90d supply, fill #3

## 2021-03-04 ENCOUNTER — Other Ambulatory Visit (HOSPITAL_COMMUNITY): Payer: Self-pay

## 2021-03-27 ENCOUNTER — Other Ambulatory Visit (HOSPITAL_COMMUNITY): Payer: Self-pay

## 2021-04-07 ENCOUNTER — Other Ambulatory Visit (HOSPITAL_COMMUNITY): Payer: Self-pay

## 2021-04-07 MED FILL — Lisinopril Tab 5 MG: ORAL | 90 days supply | Qty: 90 | Fill #0 | Status: AC

## 2021-05-20 ENCOUNTER — Other Ambulatory Visit (HOSPITAL_COMMUNITY): Payer: Self-pay

## 2021-05-20 ENCOUNTER — Other Ambulatory Visit: Payer: Self-pay | Admitting: Cardiovascular Disease

## 2021-05-20 MED ORDER — ATORVASTATIN CALCIUM 80 MG PO TABS
ORAL_TABLET | ORAL | 2 refills | Status: DC
  Filled 2021-05-20: qty 90, 90d supply, fill #0
  Filled 2021-08-19: qty 90, 90d supply, fill #1
  Filled 2021-11-18: qty 90, 90d supply, fill #2

## 2021-05-20 MED FILL — Topiramate Tab 100 MG: ORAL | 90 days supply | Qty: 90 | Fill #0 | Status: AC

## 2021-06-11 ENCOUNTER — Other Ambulatory Visit (HOSPITAL_COMMUNITY): Payer: Self-pay

## 2021-06-27 ENCOUNTER — Ambulatory Visit: Payer: No Typology Code available for payment source | Attending: Internal Medicine

## 2021-06-27 DIAGNOSIS — Z23 Encounter for immunization: Secondary | ICD-10-CM

## 2021-06-27 NOTE — Progress Notes (Signed)
   Covid-19 Vaccination Clinic  Name:  Benjamin Robbins    MRN: [id number] DOB: 10/19/1956  06/27/2021  [name] was observed post Covid-19 immunization for 15 minutes without incident. He was provided with Vaccine Information Sheet and instruction to access the V-Safe system.   [name] was instructed to call 911 with any severe reactions post vaccine: Difficulty breathing  Swelling of face and throat  A fast heartbeat  A bad rash all over body  Dizziness and weakness   Immunizations Administered     Name Date Dose VIS Date Route   PFIZER Comrnaty(Gray TOP) Covid-19 Vaccine 06/27/2021  3:06 PM 0.3 mL 11/07/2020 Intramuscular   Manufacturer: [place]   Lot: [id number]   NDC: [phone]

## 2021-07-03 ENCOUNTER — Other Ambulatory Visit (HOSPITAL_BASED_OUTPATIENT_CLINIC_OR_DEPARTMENT_OTHER): Payer: Self-pay

## 2021-07-03 MED ORDER — COVID-19 MRNA VAC-TRIS(PFIZER) 30 MCG/0.3ML IM SUSP
INTRAMUSCULAR | 0 refills | Status: DC
  Filled 2021-07-03: qty 0.3, 1d supply, fill #0

## 2021-07-21 ENCOUNTER — Other Ambulatory Visit: Payer: Self-pay | Admitting: Cardiovascular Disease

## 2021-07-21 ENCOUNTER — Other Ambulatory Visit (HOSPITAL_COMMUNITY): Payer: Self-pay

## 2021-07-21 ENCOUNTER — Telehealth: Payer: Self-pay | Admitting: Cardiovascular Disease

## 2021-07-21 MED ORDER — LISINOPRIL 5 MG PO TABS
ORAL_TABLET | Freq: Every day | ORAL | 3 refills | Status: DC
  Filled 2021-07-21: qty 90, 90d supply, fill #0
  Filled 2021-10-16: qty 90, 90d supply, fill #1
  Filled 2022-01-26: qty 90, 90d supply, fill #2
  Filled 2022-04-28: qty 90, 90d supply, fill #3

## 2021-07-21 NOTE — Telephone Encounter (Signed)
Pt c/o medication issue:  1. Name of Medication: lisinopril (ZESTRIL) 5 MG tablet (Expired)  2. How are you currently taking this medication (dosage and times per day)? TAKE 1 TABLET BY MOUTH ONCE DAILY  3. Are you having a reaction (difficulty breathing--STAT)? no  4. What is your medication issue? Pt refill is being denied for Lisinopril, please issue new rx, or if pt no longer needs to take please advise.

## 2021-07-21 NOTE — Telephone Encounter (Signed)
Spoke with patient and refill has been sent in to requested pharmacy.

## 2021-07-23 ENCOUNTER — Other Ambulatory Visit (HOSPITAL_COMMUNITY): Payer: Self-pay

## 2021-08-19 ENCOUNTER — Other Ambulatory Visit (HOSPITAL_COMMUNITY): Payer: Self-pay

## 2021-08-19 MED FILL — Topiramate Tab 100 MG: ORAL | 90 days supply | Qty: 90 | Fill #1 | Status: AC

## 2021-09-10 ENCOUNTER — Other Ambulatory Visit (HOSPITAL_COMMUNITY): Payer: Self-pay

## 2021-10-16 ENCOUNTER — Other Ambulatory Visit (HOSPITAL_COMMUNITY): Payer: Self-pay

## 2021-11-18 ENCOUNTER — Other Ambulatory Visit (HOSPITAL_COMMUNITY): Payer: Self-pay

## 2021-11-18 ENCOUNTER — Other Ambulatory Visit (HOSPITAL_BASED_OUTPATIENT_CLINIC_OR_DEPARTMENT_OTHER): Payer: Self-pay

## 2021-11-18 MED FILL — Topiramate Tab 100 MG: ORAL | 90 days supply | Qty: 90 | Fill #2 | Status: CN

## 2021-11-20 ENCOUNTER — Other Ambulatory Visit (HOSPITAL_BASED_OUTPATIENT_CLINIC_OR_DEPARTMENT_OTHER): Payer: Self-pay

## 2021-11-20 ENCOUNTER — Other Ambulatory Visit (HOSPITAL_COMMUNITY): Payer: Self-pay

## 2021-11-20 MED FILL — Topiramate Tab 100 MG: ORAL | 90 days supply | Qty: 90 | Fill #0 | Status: AC

## 2021-12-10 ENCOUNTER — Other Ambulatory Visit (HOSPITAL_COMMUNITY): Payer: Self-pay

## 2021-12-30 ENCOUNTER — Other Ambulatory Visit: Payer: Self-pay

## 2021-12-30 ENCOUNTER — Encounter: Payer: Self-pay | Admitting: Cardiovascular Disease

## 2021-12-30 ENCOUNTER — Ambulatory Visit (INDEPENDENT_AMBULATORY_CARE_PROVIDER_SITE_OTHER): Payer: No Typology Code available for payment source | Admitting: Cardiovascular Disease

## 2021-12-30 VITALS — BP 119/78 | HR 66 | Ht 73.0 in | Wt 186.4 lb

## 2021-12-30 DIAGNOSIS — Z951 Presence of aortocoronary bypass graft: Secondary | ICD-10-CM | POA: Diagnosis not present

## 2021-12-30 DIAGNOSIS — I351 Nonrheumatic aortic (valve) insufficiency: Secondary | ICD-10-CM

## 2021-12-30 DIAGNOSIS — I10 Essential (primary) hypertension: Secondary | ICD-10-CM

## 2021-12-30 DIAGNOSIS — I255 Ischemic cardiomyopathy: Secondary | ICD-10-CM

## 2021-12-30 DIAGNOSIS — E785 Hyperlipidemia, unspecified: Secondary | ICD-10-CM | POA: Diagnosis not present

## 2021-12-30 DIAGNOSIS — I25110 Atherosclerotic heart disease of native coronary artery with unstable angina pectoris: Secondary | ICD-10-CM

## 2021-12-30 NOTE — Assessment & Plan Note (Signed)
History of CAD status post anterior STEMI 06/25/2016.  I took him to the Cath Lab urgently performed coronary angiography revealing left main/three-vessel disease with severe LV dysfunction.  He underwent emergent CABG x4 by Dr. Cyndia Bent with a LIMA to his LAD, vein to an obtuse marginal branch, and sequential vein to the PDA and PLA.  His EF was 25 to 30% at that time.  He has done well since.  He denies chest pain.

## 2021-12-30 NOTE — Assessment & Plan Note (Signed)
History of moderate aortic insufficiency in the past however his most recent 2D echo performed 01/28/2021 showed only trivial AI.

## 2021-12-30 NOTE — Progress Notes (Signed)
12/30/2021 Benjamin Robbins   1956/05/18  [id number]  Primary Physician Lawerance Cruel, MD Primary Cardiologist: Lorretta Harp MD [name], [place]  HPI:  Benjamin Robbins is a 66 y.o.  thin-appearing married Caucasian male father of 99, grandfather 6 grandchildren who I last saw  in the office 01/31/2021.  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  He is currently partially retired and only works on the weekends.  He has no  prior cardiac history or risk factors other than premature family history. He has had no prior chest pain until 5:30 the afternoon of 06/25/16. 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 He developed substernal chest pain and was brought urgently to the emergency room where an EKG showed anterior ST segment elevation with reciprocal inferior depression. He was treated with aspirin and IV nitroglycerin was brought emergently to the Cath Lab where I performed cardiac catheterization revealing left main/three-vessel disease with moderately severe LV dysfunction (EF 25-30%). He underwent emergent coronary artery bypass grafting X 4 by Dr. Cyndia Bent with a LIMA to the LAD, vein to obtuse marginal branch and sequential vein to the PDA and PLA. He was discharged home on 06/30/16. He has been doing well and recuperating nicely. He finished a 12 week, outpatient cardiac rehabilitation program. He currently is a symptomatic. He did have a 2-D echocardiogram performed 10/13/16 revealing an ejection fraction of 40-45%, moderately improved compared to his EF at the time of his STEMI which was 25-30%. When I saw him in the office 4 months ago he was complaining of chest pain on a weekly basis which has improved markedly in frequency and severity. A follow-up 2-D echo performed 03/02/17 revealed an EF of 40-45%, similar to his prior echo with moderate aortic insufficiency.  His most recent echo however performed 01/28/2021 revealed a slightly increased EF of 45 to  50% with only trivial AI.    Since I saw him  in the office a year ago he continues to do well.  He complains of very subtle dyspnea on exertion unchanged from when I saw him last.  Current Meds  Medication Sig   aspirin 81 MG tablet Take 81 mg by mouth daily.   atorvastatin (LIPITOR) 80 MG tablet TAKE 1 TABLET BY MOUTH DAILY AT 6 PM.   carvedilol (COREG) 25 MG tablet TAKE 1 TABLET (25 MG TOTAL) BY MOUTH 2 (TWO) TIMES DAILY WITH A MEAL.   COVID-19 mRNA Vac-TriS, Pfizer, SUSP injection Inject into the muscle.   esomeprazole (NEXIUM) 40 MG capsule Take 40 mg by mouth daily as needed (reflux).   lisinopril (ZESTRIL) 5 MG tablet TAKE 1 TABLET BY MOUTH ONCE DAILY   Multiple Vitamin (MULTIVITAMINS PO) Take 1 tablet by mouth daily.   pantoprazole (PROTONIX) 40 MG tablet TAKE 1 TABLET BY MOUTH ONCE DAILY   topiramate (TOPAMAX) 100 MG tablet Take 100 mg by mouth at bedtime.   VITAMIN D, ERGOCALCIFEROL, PO Take 5,000 Units by mouth daily.   [DISCONTINUED] pantoprazole (PROTONIX) 40 MG tablet Take 1 tablet by mouth once a day.   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[place]: [employer]  Tobacco Use   Smoking status: Never   Smokeless tobacco: Never  Substance and Sexual Activity   Alcohol use: No   Drug use: No   Sexual activity: Not on file  Other Topics Concern   Not on file  Social History Narrative   Daily caffeine   Social Determinants of Health   Financial Resource Strain: Not on file  Food Insecurity: Not on file  Transportation Needs: Not on file  Physical Activity: Not on file  Stress: Not on file  Social Connections: Not on file  Intimate Partner Violence: Not  on file     Review of Systems: General: negative for chills, fever, night sweats or weight changes.  Cardiovascular: negative for chest pain, dyspnea on exertion, edema, orthopnea, palpitations, paroxysmal nocturnal dyspnea or shortness of breath Dermatological: negative for rash Respiratory: negative for cough or wheezing Urologic: negative for hematuria Abdominal: negative for nausea, vomiting, diarrhea, bright red blood per rectum, melena, or hematemesis Neurologic: negative for visual changes, syncope, or dizziness All other systems reviewed and are otherwise negative except as noted above.    Blood pressure 119/78, pulse 66, height 6\' 1"  (1.854 m), weight 186 lb 6.4 oz (84.6 kg), SpO2 98 %.  General appearance: alert and no distress Neck: no adenopathy, no carotid bruit, no JVD, supple, symmetrical, trachea midline, and thyroid not enlarged, symmetric, no tenderness/mass/nodules Lungs: clear to auscultation bilaterally Heart: regular rate and rhythm, S1, S2 normal, no murmur, click, rub or gallop Extremities: extremities normal, atraumatic, no cyanosis or edema Pulses: 2+ and symmetric Skin: Skin color, texture, turgor normal. No rashes or lesions Neurologic: Grossly normal  EKG sinus rhythm at 66 with septal Q waves nonspecific ST and T wave changes.  I personally reviewed this EKG.  ASSESSMENT AND PLAN:   S/P CABG x 4 History of CAD status post anterior STEMI 06/25/2016.  I took him to the Cath Lab urgently performed coronary angiography revealing left main/three-vessel disease with severe LV dysfunction.  He underwent emergent CABG x4 by Dr. Cyndia Bent with a LIMA to his LAD, vein to an obtuse marginal branch, and sequential vein to the PDA and PLA.  His EF was 25 to 30% at that time.  He has done well since.  He denies chest pain.  Cardiomyopathy, ischemic History of ischemic cardiomyopathy with an EF at the time of STEMI of 25 to 30%.  His most recent EF by 2D echo performed  01/28/2021 was 45 to 50%.  He does complain of mild shortness of breath.  He is on carvedilol and lisinopril.  Dyslipidemia History of dyslipidemia on high-dose statin therapy with lipid profile performed 01/09/2021 revealing total cholesterol 106, LDL 52 and HDL 41, followed by his PCP.  Moderate aortic insufficiency History of moderate aortic insufficiency in the past however his most recent 2D echo performed 01/28/2021 showed only trivial AI.     Lorretta Harp MD FACP,FACC,FAHA, [facility] 12/30/2021 8:21 AM

## 2021-12-30 NOTE — Assessment & Plan Note (Signed)
History of ischemic cardiomyopathy with an EF at the time of STEMI of 25 to 30%.  His most recent EF by 2D echo performed 01/28/2021 was 45 to 50%.  He does complain of mild shortness of breath.  He is on carvedilol and lisinopril.

## 2021-12-30 NOTE — Patient Instructions (Signed)

## 2021-12-30 NOTE — Assessment & Plan Note (Signed)
History of dyslipidemia on high-dose statin therapy with lipid profile performed 01/09/2021 revealing total cholesterol 106, LDL 52 and HDL 41, followed by his PCP.

## 2022-01-27 ENCOUNTER — Other Ambulatory Visit (HOSPITAL_COMMUNITY): Payer: Self-pay

## 2022-01-28 ENCOUNTER — Other Ambulatory Visit (HOSPITAL_COMMUNITY): Payer: Self-pay

## 2022-01-28 MED ORDER — PANTOPRAZOLE SODIUM 40 MG PO TBEC
DELAYED_RELEASE_TABLET | ORAL | 4 refills | Status: DC
  Filled 2022-01-28 – 2022-03-31 (×2): qty 90, 90d supply, fill #0
  Filled 2022-06-10: qty 90, 90d supply, fill #1
  Filled 2022-10-01: qty 90, 90d supply, fill #2
  Filled 2022-12-17: qty 90, 90d supply, fill #3

## 2022-01-28 MED ORDER — TOPIRAMATE 100 MG PO TABS
ORAL_TABLET | ORAL | 4 refills | Status: DC
  Filled 2022-01-28: qty 90, 90d supply, fill #0
  Filled 2022-02-11 – 2022-05-11 (×2): qty 90, 90d supply, fill #1
  Filled 2022-08-18: qty 90, 90d supply, fill #2
  Filled 2022-10-27 – 2022-11-04 (×2): qty 90, 90d supply, fill #3

## 2022-01-31 ENCOUNTER — Other Ambulatory Visit (HOSPITAL_COMMUNITY): Payer: Self-pay

## 2022-02-11 ENCOUNTER — Other Ambulatory Visit (HOSPITAL_COMMUNITY): Payer: Self-pay

## 2022-02-11 ENCOUNTER — Other Ambulatory Visit: Payer: Self-pay | Admitting: Cardiovascular Disease

## 2022-02-12 ENCOUNTER — Other Ambulatory Visit (HOSPITAL_COMMUNITY): Payer: Self-pay

## 2022-02-12 MED ORDER — ATORVASTATIN CALCIUM 80 MG PO TABS
ORAL_TABLET | ORAL | 3 refills | Status: DC
Start: 2022-02-12 — End: 2023-02-03
  Filled 2022-02-12: qty 90, 90d supply, fill #0
  Filled 2022-05-11: qty 90, 90d supply, fill #1
  Filled 2022-08-02: qty 90, 90d supply, fill #2
  Filled 2022-11-04: qty 90, 90d supply, fill #3

## 2022-03-09 ENCOUNTER — Other Ambulatory Visit: Payer: Self-pay | Admitting: Cardiovascular Disease

## 2022-03-10 ENCOUNTER — Other Ambulatory Visit (HOSPITAL_COMMUNITY): Payer: Self-pay

## 2022-03-10 MED ORDER — CARVEDILOL 25 MG PO TABS
ORAL_TABLET | Freq: Two times a day (BID) | ORAL | 3 refills | Status: DC
  Filled 2022-03-10: qty 180, 90d supply, fill #0
  Filled 2022-06-10: qty 180, 90d supply, fill #1
  Filled 2022-09-14: qty 180, 90d supply, fill #2
  Filled 2022-12-17: qty 180, 90d supply, fill #3

## 2022-03-31 ENCOUNTER — Other Ambulatory Visit (HOSPITAL_COMMUNITY): Payer: Self-pay

## 2022-04-13 ENCOUNTER — Other Ambulatory Visit (HOSPITAL_COMMUNITY): Payer: Self-pay

## 2022-04-28 ENCOUNTER — Other Ambulatory Visit (HOSPITAL_COMMUNITY): Payer: Self-pay

## 2022-04-30 HISTORY — PX: CATARACT EXTRACTION, BILATERAL: SHX1313

## 2022-05-11 ENCOUNTER — Other Ambulatory Visit (HOSPITAL_COMMUNITY): Payer: Self-pay

## 2022-06-10 ENCOUNTER — Other Ambulatory Visit (HOSPITAL_COMMUNITY): Payer: Self-pay

## 2022-06-11 ENCOUNTER — Other Ambulatory Visit (HOSPITAL_COMMUNITY): Payer: Self-pay

## 2022-08-02 ENCOUNTER — Other Ambulatory Visit: Payer: Self-pay | Admitting: Cardiovascular Disease

## 2022-08-03 ENCOUNTER — Other Ambulatory Visit (HOSPITAL_COMMUNITY): Payer: Self-pay

## 2022-08-03 MED ORDER — LISINOPRIL 5 MG PO TABS
ORAL_TABLET | Freq: Every day | ORAL | 3 refills | Status: DC
Start: 2022-08-03 — End: 2023-03-19
  Filled 2022-08-03: qty 90, 90d supply, fill #0
  Filled 2022-10-27: qty 90, 90d supply, fill #1
  Filled 2023-02-03: qty 90, 90d supply, fill #2

## 2022-08-04 ENCOUNTER — Other Ambulatory Visit (HOSPITAL_COMMUNITY): Payer: Self-pay

## 2022-08-18 ENCOUNTER — Other Ambulatory Visit (HOSPITAL_COMMUNITY): Payer: Self-pay

## 2022-09-14 ENCOUNTER — Other Ambulatory Visit (HOSPITAL_COMMUNITY): Payer: Self-pay

## 2022-09-15 ENCOUNTER — Other Ambulatory Visit (HOSPITAL_BASED_OUTPATIENT_CLINIC_OR_DEPARTMENT_OTHER): Payer: Self-pay

## 2022-09-15 MED ORDER — COMIRNATY 30 MCG/0.3ML IM SUSY
PREFILLED_SYRINGE | INTRAMUSCULAR | 0 refills | Status: DC
  Filled 2022-09-15: qty 0.3, 1d supply, fill #0

## 2022-10-01 ENCOUNTER — Other Ambulatory Visit (HOSPITAL_COMMUNITY): Payer: Self-pay

## 2022-10-27 ENCOUNTER — Other Ambulatory Visit (HOSPITAL_COMMUNITY): Payer: Self-pay

## 2022-11-04 ENCOUNTER — Other Ambulatory Visit (HOSPITAL_COMMUNITY): Payer: Self-pay

## 2022-11-09 ENCOUNTER — Other Ambulatory Visit (HOSPITAL_COMMUNITY): Payer: Self-pay

## 2022-12-17 ENCOUNTER — Other Ambulatory Visit (HOSPITAL_COMMUNITY): Payer: Self-pay

## 2022-12-30 DIAGNOSIS — H43811 Vitreous degeneration, right eye: Secondary | ICD-10-CM | POA: Diagnosis not present

## 2022-12-30 DIAGNOSIS — H401111 Primary open-angle glaucoma, right eye, mild stage: Secondary | ICD-10-CM | POA: Diagnosis not present

## 2022-12-30 DIAGNOSIS — H40012 Open angle with borderline findings, low risk, left eye: Secondary | ICD-10-CM | POA: Diagnosis not present

## 2022-12-30 DIAGNOSIS — Z961 Presence of intraocular lens: Secondary | ICD-10-CM | POA: Diagnosis not present

## 2022-12-30 DIAGNOSIS — H43392 Other vitreous opacities, left eye: Secondary | ICD-10-CM | POA: Diagnosis not present

## 2023-01-20 ENCOUNTER — Encounter: Payer: Self-pay | Admitting: Cardiovascular Disease

## 2023-01-20 ENCOUNTER — Ambulatory Visit: Payer: 59 | Attending: Cardiovascular Disease | Admitting: Cardiovascular Disease

## 2023-01-20 VITALS — BP 140/72 | HR 60 | Ht 72.0 in | Wt 183.8 lb

## 2023-01-20 DIAGNOSIS — I255 Ischemic cardiomyopathy: Secondary | ICD-10-CM | POA: Diagnosis not present

## 2023-01-20 DIAGNOSIS — E785 Hyperlipidemia, unspecified: Secondary | ICD-10-CM | POA: Diagnosis not present

## 2023-01-20 DIAGNOSIS — I252 Old myocardial infarction: Secondary | ICD-10-CM | POA: Diagnosis not present

## 2023-01-20 NOTE — Patient Instructions (Signed)
Medication Instructions:  Your physician recommends that you continue on your current medications as directed. Please refer to the Current Medication list given to you today.  *If you need a refill on your cardiac medications before your next appointment, please call your pharmacy*   Follow-Up: At [facility], you and your health needs are our priority.  As part of our continuing mission to provide you with exceptional heart care, we have created designated Provider Care Teams.  These Care Teams include your primary Cardiologist (physician) and Advanced Practice Providers (APPs -  Physician Assistants and Nurse Practitioners) who all work together to provide you with the care you need, when you need it.  We recommend signing up for the patient portal called "MyChart".  Sign up information is provided on this After Visit Summary.  MyChart is used to connect with patients for Virtual Visits (Telemedicine).  Patients are able to view lab/test results, encounter notes, upcoming appointments, etc.  Non-urgent messages can be sent to your provider as well.   To learn more about what you can do with MyChart, go to [email or web address].    Your next appointment:   12 month(s)  Provider:   Quay Burow, MD

## 2023-01-20 NOTE — Progress Notes (Signed)
01/20/2023 Benjamin Robbins   1956/09/20  [id number]  Primary Physician Lawerance Cruel, MD Primary Cardiologist: Lorretta Harp MD [name], [place]  HPI:  Benjamin Robbins is a 67 y.o.    thin-appearing married Caucasian male father of 8, grandfather 6 grandchildren who I last saw  in the office 12/30/2021.  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  He has no  prior cardiac history or risk factors other than premature family history. He has had no prior chest pain until 5:30 the afternoon of 06/25/16. 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 He developed substernal chest pain and was brought urgently to the emergency room where an EKG showed anterior ST segment elevation with reciprocal inferior depression. He was treated with aspirin and IV nitroglycerin was brought emergently to the Cath Lab where I performed cardiac catheterization revealing left main/three-vessel disease with moderately severe LV dysfunction (EF 25-30%). He underwent emergent coronary artery bypass grafting X 4 by Dr. Cyndia Bent with a LIMA to the LAD, vein to obtuse marginal branch and sequential vein to the PDA and PLA. He was discharged home on 06/30/16. He has been doing well and recuperating nicely. He finished a 12 week, outpatient cardiac rehabilitation program. He currently is a symptomatic. He did have a 2-D echocardiogram performed 10/13/16 revealing an ejection fraction of 40-45%, moderately improved compared to his EF at the time of his STEMI which was 25-30%. When I saw him in the office 4 months ago he was complaining of chest pain on a weekly basis which has improved markedly in frequency and severity. A follow-up 2-D echo performed 03/02/17 revealed an EF of 40-45%, similar to his prior echo with moderate aortic insufficiency.  His most recent echo however performed 01/28/2021 revealed a slightly increased EF of 45 to 50%  with only trivial AI.     Since I saw him  in the office a year ago he continues to do well.  He complains of very subtle dyspnea on exertion unchanged from when I saw him last.  He denies chest pain.     Current Meds  Medication Sig   aspirin 81 MG tablet Take 81 mg by mouth daily.   atorvastatin (LIPITOR) 80 MG tablet TAKE 1 TABLET BY MOUTH DAILY AT 6 PM.   carvedilol (COREG) 25 MG tablet TAKE 1 TABLET (25 MG TOTAL) BY MOUTH 2 (TWO) TIMES DAILY WITH A MEAL.   COVID-19 mRNA Vac-TriS, Pfizer, SUSP injection Inject into the muscle.   COVID-19 mRNA vaccine 2023-2024 (COMIRNATY) syringe Inject into the muscle.   esomeprazole (NEXIUM) 40 MG capsule Take 40 mg by mouth daily as needed (reflux).   lisinopril (ZESTRIL) 5 MG tablet TAKE 1 TABLET BY MOUTH ONCE DAILY   Multiple Vitamin (MULTIVITAMINS PO) Take 1 tablet by mouth daily.   pantoprazole (PROTONIX) 40 MG tablet Take 1 tablet by mouth once a day.   topiramate (TOPAMAX) 100 MG tablet Take 100 mg by mouth at bedtime.   VITAMIN D, ERGOCALCIFEROL, PO Take 5,000 Units by mouth daily.     No Known Allergies  Social History   Socioeconomic History   Marital status: Married    Spouse name: Not on file   Number of children: 3   Years of education: Not on file   Highest education level: Not on file  Occupational History   Occupation: Supply in [place]  Employer: [employer]  Tobacco Use   Smoking status: Never   Smokeless tobacco: Never  Substance and Sexual Activity   Alcohol use: No   Drug use: No   Sexual activity: Not on file  Other Topics Concern   Not on file  Social History Narrative   Daily caffeine   Social Determinants of Health   Financial Resource Strain: Not on file  Food Insecurity: Not on file  Transportation Needs: Not on file  Physical Activity: Not on file  Stress: Not on file  Social Connections: Not on file  Intimate Partner Violence: Not on file     Review of Systems: General: negative for chills,  fever, night sweats or weight changes.  Cardiovascular: negative for chest pain, dyspnea on exertion, edema, orthopnea, palpitations, paroxysmal nocturnal dyspnea or shortness of breath Dermatological: negative for rash Respiratory: negative for cough or wheezing Urologic: negative for hematuria Abdominal: negative for nausea, vomiting, diarrhea, bright red blood per rectum, melena, or hematemesis Neurologic: negative for visual changes, syncope, or dizziness All other systems reviewed and are otherwise negative except as noted above.    Blood pressure (!) 156/72, pulse 60, height 6' (1.829 m), weight 183 lb 12.8 oz (83.4 kg), SpO2 99 %.  General appearance: alert and no distress Neck: no adenopathy, no carotid bruit, no JVD, supple, symmetrical, trachea midline, and thyroid not enlarged, symmetric, no tenderness/mass/nodules Lungs: clear to auscultation bilaterally Heart: regular rate and rhythm, S1, S2 normal, no murmur, click, rub or gallop Extremities: extremities normal, atraumatic, no cyanosis or edema Pulses: 2+ and symmetric Skin: Skin color, texture, turgor normal. No rashes or lesions Neurologic: Grossly normal  EKG sinus rhythm at 60 with septal Q waves and LVH voltage with repolarization changes unchanged from prior EKGs.  I personally reviewed this EKG.  ASSESSMENT AND PLAN:   History of ST elevation myocardial infarction (STEMI) History of CAD status post urgent catheterization by myself 06/25/2016 in the setting of new onset chest pain at 5:30 in the afternoon.  He had anterior ST segment elevation with reciprocal inferior depression.  Cath revealed left main/three-vessel disease with severe LV dysfunction (EF 25 to 30%.  He underwent urgent CABG by Dr. Cyndia Bent with a LIMA to the LAD, vein to obtuse marginal branch, sequential vein to the PDA and PLA.  He was discharged home on 88/1/17.  He remains completely asymptomatic.  Cardiomyopathy, ischemic History of ischemic  cardiomyopathy with most recent EF measured 01/28/2021 of 45 to 50% probably related to his myocardial infarction.  He has no symptoms of heart failure.  He is on carvedilol and lisinopril.  Dyslipidemia History of dyslipidemia on high-dose atorvastatin with lipid profile performed 01/28/2022 revealing total cholesterol of 105, LDL 57 and HDL 33.     Lorretta Harp MD [facility], [facility] 01/20/2023 2:12 PM

## 2023-01-20 NOTE — Assessment & Plan Note (Signed)
History of CAD status post urgent catheterization by myself 06/25/2016 in the setting of new onset chest pain at 5:30 in the afternoon.  He had anterior ST segment elevation with reciprocal inferior depression.  Cath revealed left main/three-vessel disease with severe LV dysfunction (EF 25 to 30%.  He underwent urgent CABG by Dr. Cyndia Bent with a LIMA to the LAD, vein to obtuse marginal branch, sequential vein to the PDA and PLA.  He was discharged home on 88/1/17.  He remains completely asymptomatic.

## 2023-01-20 NOTE — Assessment & Plan Note (Signed)
History of ischemic cardiomyopathy with most recent EF measured 01/28/2021 of 45 to 50% probably related to his myocardial infarction.  He has no symptoms of heart failure.  He is on carvedilol and lisinopril.

## 2023-01-20 NOTE — Assessment & Plan Note (Signed)
History of dyslipidemia on high-dose atorvastatin with lipid profile performed 01/28/2022 revealing total cholesterol of 105, LDL 57 and HDL 33.

## 2023-02-03 ENCOUNTER — Other Ambulatory Visit: Payer: Self-pay | Admitting: Cardiovascular Disease

## 2023-02-03 ENCOUNTER — Other Ambulatory Visit: Payer: Self-pay

## 2023-02-03 ENCOUNTER — Other Ambulatory Visit (HOSPITAL_COMMUNITY): Payer: Self-pay

## 2023-02-03 MED ORDER — ATORVASTATIN CALCIUM 80 MG PO TABS
ORAL_TABLET | ORAL | 3 refills | Status: DC
Start: 2023-02-03 — End: 2023-08-11
  Filled 2023-02-03: qty 90, 90d supply, fill #0
  Filled 2023-05-04: qty 90, 90d supply, fill #1

## 2023-02-05 ENCOUNTER — Other Ambulatory Visit (HOSPITAL_COMMUNITY): Payer: Self-pay

## 2023-02-06 ENCOUNTER — Other Ambulatory Visit (HOSPITAL_COMMUNITY): Payer: Self-pay

## 2023-02-10 ENCOUNTER — Other Ambulatory Visit (HOSPITAL_COMMUNITY): Payer: Self-pay

## 2023-02-10 ENCOUNTER — Other Ambulatory Visit: Payer: Self-pay

## 2023-02-10 DIAGNOSIS — Z0000 Encounter for general adult medical examination without abnormal findings: Secondary | ICD-10-CM | POA: Diagnosis not present

## 2023-02-10 DIAGNOSIS — Z125 Encounter for screening for malignant neoplasm of prostate: Secondary | ICD-10-CM | POA: Diagnosis not present

## 2023-02-10 DIAGNOSIS — I10 Essential (primary) hypertension: Secondary | ICD-10-CM | POA: Diagnosis not present

## 2023-02-10 DIAGNOSIS — G43109 Migraine with aura, not intractable, without status migrainosus: Secondary | ICD-10-CM | POA: Diagnosis not present

## 2023-02-10 DIAGNOSIS — I2510 Atherosclerotic heart disease of native coronary artery without angina pectoris: Secondary | ICD-10-CM | POA: Diagnosis not present

## 2023-02-10 DIAGNOSIS — Z13220 Encounter for screening for lipoid disorders: Secondary | ICD-10-CM | POA: Diagnosis not present

## 2023-02-10 DIAGNOSIS — Z6824 Body mass index (BMI) 24.0-24.9, adult: Secondary | ICD-10-CM | POA: Diagnosis not present

## 2023-02-10 DIAGNOSIS — E559 Vitamin D deficiency, unspecified: Secondary | ICD-10-CM | POA: Diagnosis not present

## 2023-02-10 DIAGNOSIS — R7301 Impaired fasting glucose: Secondary | ICD-10-CM | POA: Diagnosis not present

## 2023-02-10 DIAGNOSIS — K219 Gastro-esophageal reflux disease without esophagitis: Secondary | ICD-10-CM | POA: Diagnosis not present

## 2023-02-10 MED ORDER — PANTOPRAZOLE SODIUM 40 MG PO TBEC
40.0000 mg | DELAYED_RELEASE_TABLET | Freq: Every day | ORAL | 4 refills | Status: AC
  Filled 2023-02-10 – 2023-03-19 (×2): qty 90, 90d supply, fill #0
  Filled 2023-05-27 – 2023-06-02 (×3): qty 90, 90d supply, fill #1

## 2023-02-10 MED ORDER — LISINOPRIL 5 MG PO TABS
5.0000 mg | ORAL_TABLET | Freq: Every day | ORAL | 4 refills | Status: DC
  Filled 2023-02-10: qty 90, 90d supply, fill #0

## 2023-02-10 MED ORDER — TOPIRAMATE 100 MG PO TABS
100.0000 mg | ORAL_TABLET | Freq: Every day | ORAL | 4 refills | Status: AC
  Filled 2023-02-10: qty 90, 90d supply, fill #0
  Filled 2023-05-04: qty 90, 90d supply, fill #1

## 2023-02-11 ENCOUNTER — Encounter: Payer: Self-pay | Admitting: Gastroenterology

## 2023-02-15 ENCOUNTER — Other Ambulatory Visit (HOSPITAL_COMMUNITY): Payer: Self-pay

## 2023-02-15 ENCOUNTER — Other Ambulatory Visit: Payer: Self-pay

## 2023-02-15 MED ORDER — LISINOPRIL 10 MG PO TABS
10.0000 mg | ORAL_TABLET | Freq: Every day | ORAL | 1 refills | Status: DC
  Filled 2023-02-15: qty 90, 90d supply, fill #0
  Filled 2023-05-12: qty 90, 90d supply, fill #1

## 2023-02-16 ENCOUNTER — Encounter: Payer: Self-pay | Admitting: Gastroenterology

## 2023-03-19 ENCOUNTER — Other Ambulatory Visit: Payer: Self-pay | Admitting: Cardiovascular Disease

## 2023-03-19 ENCOUNTER — Other Ambulatory Visit: Payer: Self-pay

## 2023-03-19 ENCOUNTER — Other Ambulatory Visit (HOSPITAL_COMMUNITY): Payer: Self-pay

## 2023-03-19 ENCOUNTER — Ambulatory Visit (AMBULATORY_SURGERY_CENTER): Payer: 59

## 2023-03-19 VITALS — Ht 72.0 in | Wt 172.0 lb

## 2023-03-19 DIAGNOSIS — Z1211 Encounter for screening for malignant neoplasm of colon: Secondary | ICD-10-CM

## 2023-03-19 MED ORDER — CARVEDILOL 25 MG PO TABS
25.0000 mg | ORAL_TABLET | Freq: Two times a day (BID) | ORAL | 3 refills | Status: DC
  Filled 2023-03-19: qty 180, 90d supply, fill #0
  Filled 2023-05-27 – 2023-06-02 (×3): qty 180, 90d supply, fill #1

## 2023-03-19 MED ORDER — NA SULFATE-K SULFATE-MG SULF 17.5-3.13-1.6 GM/177ML PO SOLN
1.0000 | Freq: Once | ORAL | 0 refills | Status: AC
  Filled 2023-03-19: qty 354, 2d supply, fill #0

## 2023-03-19 NOTE — Progress Notes (Signed)
Pre visit completed via phone call; Patient verified name, DOB, and address;  No egg or soy allergy known to patient;  No issues known to pt with past sedation with any surgeries or procedures; Patient denies ever being told they had issues or difficulty with intubation;  No FH of Malignant Hyperthermia; Pt is not on diet pills; Pt is not on home 02;  Pt is not on blood thinners;  Pt denies issues with constipation;  No A fib or A flutter; Have any cardiac testing pending--NO Pt instructed to use Singlecare.com or GoodRx for a price reduction on prep;   Insurance verified during PV appt=Aet[employer] ([facility])  Patient's chart reviewed by Cathlyn Parsons CNRA prior to previsit and patient appropriate for the [facility].  Previsit completed and red dot placed by patient's name on their procedure day (on provider's schedule).    Instructions printed and mailed to the patient per his request;

## 2023-04-07 ENCOUNTER — Encounter: Payer: Self-pay | Admitting: Gastroenterology

## 2023-04-09 ENCOUNTER — Other Ambulatory Visit: Payer: Self-pay

## 2023-04-16 ENCOUNTER — Telehealth: Payer: Self-pay | Admitting: Gastroenterology

## 2023-04-16 ENCOUNTER — Encounter: Payer: 59 | Admitting: Gastroenterology

## 2023-04-16 NOTE — Telephone Encounter (Signed)
Pt aware.

## 2023-04-16 NOTE — Telephone Encounter (Signed)
Patient called stating he made a appointment for 05/20 for a tooth to get pulled. He would like to make sure it is okay to have this done before his colonoscopy on 05/21. Please advise, thank you.

## 2023-04-16 NOTE — Telephone Encounter (Signed)
Pt scheduled for colon on 5/21. He is scheduled to have a molar on his lower jaw pulled that has some decay under it. He wanted to make sure it is ok to have the colon done the next day. Discussed with him I did not think there was an issue but would double check to make sure. Please advise.

## 2023-04-20 ENCOUNTER — Encounter: Payer: Self-pay | Admitting: Gastroenterology

## 2023-04-20 ENCOUNTER — Ambulatory Visit (AMBULATORY_SURGERY_CENTER): Payer: 59 | Admitting: Gastroenterology

## 2023-04-20 VITALS — BP 122/60 | HR 75 | Temp 96.9°F | Resp 17 | Ht 72.0 in | Wt 172.0 lb

## 2023-04-20 DIAGNOSIS — D120 Benign neoplasm of cecum: Secondary | ICD-10-CM | POA: Diagnosis not present

## 2023-04-20 DIAGNOSIS — K635 Polyp of colon: Secondary | ICD-10-CM | POA: Diagnosis not present

## 2023-04-20 DIAGNOSIS — D123 Benign neoplasm of transverse colon: Secondary | ICD-10-CM

## 2023-04-20 DIAGNOSIS — I10 Essential (primary) hypertension: Secondary | ICD-10-CM | POA: Diagnosis not present

## 2023-04-20 DIAGNOSIS — Z1211 Encounter for screening for malignant neoplasm of colon: Secondary | ICD-10-CM

## 2023-04-20 DIAGNOSIS — D122 Benign neoplasm of ascending colon: Secondary | ICD-10-CM

## 2023-04-20 DIAGNOSIS — E785 Hyperlipidemia, unspecified: Secondary | ICD-10-CM | POA: Diagnosis not present

## 2023-04-20 MED ORDER — SODIUM CHLORIDE 0.9 % IV SOLN: 500.0000 mL | Freq: Once | INTRAVENOUS | Status: DC

## 2023-04-20 NOTE — Patient Instructions (Addendum)
Resume previous diet Continue present medications Await pathology results Handouts/information given for polyps and hemorrhoids  YOU HAD AN ENDOSCOPIC PROCEDURE TODAY AT THE [facility]:   Refer to the procedure report that was given to you for any specific questions about what was found during the examination.  If the procedure report does not answer your questions, please call your gastroenterologist to clarify.  If you requested that your care partner not be given the details of your procedure findings, then the procedure report has been included in a sealed envelope for you to review at your convenience later.  YOU SHOULD EXPECT: Some feelings of bloating in the abdomen. Passage of more gas than usual.  Walking can help get rid of the air that was put into your GI tract during the procedure and reduce the bloating. If you had a lower endoscopy (such as a colonoscopy or flexible sigmoidoscopy) you may notice spotting of blood in your stool or on the toilet paper. If you underwent a bowel prep for your procedure, you may not have a normal bowel movement for a few days.  Please Note:  You might notice some irritation and congestion in your nose or some drainage.  This is from the oxygen used during your procedure.  There is no need for concern and it should clear up in a day or so.  SYMPTOMS TO REPORT IMMEDIATELY:  Following lower endoscopy (colonoscopy):  Excessive amounts of blood in the stool  Significant tenderness or worsening of abdominal pains  Swelling of the abdomen that is new, acute  Fever of 100F or higher  For urgent or emergent issues, a gastroenterologist can be reached at any hour by calling [phone]. Do not use MyChart messaging for urgent concerns.   DIET:  We do recommend a small meal at first, but then you may proceed to your regular diet.  Drink plenty of fluids but you should avoid alcoholic beverages for 24 hours.  ACTIVITY:  You should plan to take  it easy for the rest of today and you should NOT DRIVE or use heavy machinery until tomorrow (because of the sedation medicines used during the test).    FOLLOW UP: Our staff will call the number listed on your records the next business day following your procedure.  We will call around 7:15- 8:00 am to check on you and address any questions or concerns that you may have regarding the information given to you following your procedure. If we do not reach you, we will leave a message.     If any biopsies were taken you will be contacted by phone or by letter within the next 1-3 weeks.  Please call us at [phone] if you have not heard about the biopsies in 3 weeks.    SIGNATURES/CONFIDENTIALITY: You and/or your care partner have signed paperwork which will be entered into your electronic medical record.  These signatures attest to the fact that that the information above on your After Visit Summary has been reviewed and is understood.  Full responsibility of the confidentiality of this discharge information lies with you and/or your care-partner. 

## 2023-04-20 NOTE — Progress Notes (Signed)
[name] History and Physical   Primary Care Physician:  Daisy Floro, MD   Reason for Procedure:   Colon cancer screening  Plan:    Screening colonoscopy     HPI: Benjamin Robbins is a 67 y.o. male undergoing average risk screening colonoscopy.  He has no family history of colon cancer and no chronic GI symptoms.  He had a normal colonoscopy in Feb 2014.  Past Medical History:  Diagnosis Date   GERD (gastroesophageal reflux disease)    on meds   Hyperlipidemia    on meds   Hypertension    on meds   Migraine    Prostate CA (HCC) 2009    Past Surgical History:  Procedure Laterality Date   CARDIAC CATHETERIZATION [name] 06/25/2016   Procedure: Left Heart Cath and Coronary Angiography;  Surgeon: Runell Gess, MD;  Location: [facility] INVASIVE CV LAB;  Service: Cardiovascular;  Laterality: [name];   CATARACT EXTRACTION, BILATERAL  04/2022   COLONOSCOPY  2014   Dr. Petterson/moviprep(exc)   CORONARY ARTERY BYPASS GRAFT [name] 06/25/2016   Procedure: CORONARY ARTERY BYPASS GRAFTING (CABG) x (LIMA to LAD, SVG to OM, SVG SEQUENTIALLY to PDA and PLB) with EVH from RIGHT THIGH and LOWER LEG;  Surgeon: Alleen Borne, MD;  Location: [facility] OR;  Service: Open Heart Surgery;  Laterality: [name];   MOLE REMOVAL     off neck   PROSTATECTOMY  11/30/2008    Prior to Admission medications   Medication Sig Start Date End Date Taking? Authorizing Provider  aspirin 81 MG tablet Take 81 mg by mouth daily.   Yes [provider]  atorvastatin (LIPITOR) 80 MG tablet TAKE 1 TABLET BY MOUTH DAILY AT 6 PM. 02/03/23  Yes Runell Gess, MD  carvedilol (COREG) 25 MG tablet Take 1 tablet (25 mg total) by mouth 2 (two) times daily with a meal. 03/19/23  Yes Runell Gess, MD  lisinopril (ZESTRIL) 10 MG tablet Take 1 tablet (10 mg total) by mouth daily. 02/15/23  Yes   Multiple Vitamin (MULTIVITAMINS PO) Take 1 tablet by mouth daily.   Yes [provider]  pantoprazole  (PROTONIX) 40 MG tablet Take 1 tablet (40 mg total) by mouth daily. 02/10/23  Yes   topiramate (TOPAMAX) 100 MG tablet Take 1 tablet (100 mg total) by mouth daily. 02/10/23  Yes   VITAMIN D, ERGOCALCIFEROL, PO Take 5,000 Units by mouth daily.   Yes [provider]    Current Outpatient Medications  Medication Sig Dispense Refill   aspirin 81 MG tablet Take 81 mg by mouth daily.     atorvastatin (LIPITOR) 80 MG tablet TAKE 1 TABLET BY MOUTH DAILY AT 6 PM. 90 tablet 3   carvedilol (COREG) 25 MG tablet Take 1 tablet (25 mg total) by mouth 2 (two) times daily with a meal. 180 tablet 3   lisinopril (ZESTRIL) 10 MG tablet Take 1 tablet (10 mg total) by mouth daily. 90 tablet 1   Multiple Vitamin (MULTIVITAMINS PO) Take 1 tablet by mouth daily.     pantoprazole (PROTONIX) 40 MG tablet Take 1 tablet (40 mg total) by mouth daily. 90 tablet 4   topiramate (TOPAMAX) 100 MG tablet Take 1 tablet (100 mg total) by mouth daily. 90 tablet 4   VITAMIN D, ERGOCALCIFEROL, PO Take 5,000 Units by mouth daily.     Current Facility-Administered Medications  Medication Dose Route Frequency Provider Last Rate Last Admin   0.9 %  sodium chloride infusion  500 mL Intravenous Once Jenel Lucks, MD        Allergies as of 04/20/2023   (No Known Allergies)    Family History  Problem Relation Age of Onset   Pancreatic cancer Mother 45   Diabetes Mother    Emphysema Father    CAD Brother 43       MI, he was a heavy smoker   Colon cancer Neg Hx    Esophageal cancer Neg Hx    Stomach cancer Neg Hx    Rectal cancer Neg Hx    Colon polyps Neg Hx     Social History   Socioeconomic History   Marital status: Married    Spouse name: Not on file   Number of children: 3   Years of education: Not on file   Highest education level: Not on file  Occupational History   Occupation: Supply in OR    Employer: [employer]  Tobacco Use   Smoking status: Never   Smokeless tobacco: Never  Vaping Use    Vaping Use: Never used  Substance and Sexual Activity   Alcohol use: No   Drug use: No   Sexual activity: Not on file  Other Topics Concern   Not on file  Social History Narrative   Daily caffeine   Social Determinants of Health   Financial Resource Strain: Not on file  Food Insecurity: Not on file  Transportation Needs: Not on file  Physical Activity: Not on file  Stress: Not on file  Social Connections: Not on file  Intimate Partner Violence: Not on file    Review of Systems:  All other review of systems negative except as mentioned in the HPI.  Physical Exam: Vital signs BP 119/62   Pulse 61   Temp (!) 96.9 F (36.1 C)   Ht 6' (1.829 m)   Wt 172 lb (78 kg)   SpO2 99%   BMI 23.33 kg/m   General:   Alert,  Well-developed, well-nourished, pleasant and cooperative in NAD Airway:  Mallampati 1 Lungs:  Clear throughout to auscultation.   Heart:  Regular rate and rhythm; no murmurs, clicks, rubs,  or gallops. Abdomen:  Soft, nontender and nondistended. Normal bowel sounds.   Neuro/Psych:  Normal mood and affect. A and O x 3   [name] E. Tomasa Rand, MD [facility]

## 2023-04-20 NOTE — Progress Notes (Signed)
Called to room to assist during endoscopic procedure.  Patient ID and intended procedure confirmed with present staff. Received instructions for my participation in the procedure from the performing physician.  

## 2023-04-20 NOTE — Progress Notes (Signed)
VS by CW  Pt's states no medical or surgical changes since previsit or office visit.  

## 2023-04-20 NOTE — Progress Notes (Signed)
Sedate, gd SR, tolerated procedure well, VSS, report to RN 

## 2023-04-20 NOTE — Op Note (Signed)
[facility] Patient Name: Benjamin Robbins Procedure Date: 04/20/2023 10:36 AM MRN: [id number] Endoscopist: Lorin Picket E. Tomasa Rand , MD, [id number] Age: 67 Referring MD:  Date of Birth: 1956-01-20 Gender: Male Account #: [id number] Procedure:                Colonoscopy Indications:              Screening for colorectal malignant neoplasm (last                            colonoscopy was 10 years ago) Medicines:                Monitored Anesthesia Care Procedure:                Pre-Anesthesia Assessment:                           - Prior to the procedure, a History and Physical                            was performed, and patient medications and                            allergies were reviewed. The patient's tolerance of                            previous anesthesia was also reviewed. The risks                            and benefits of the procedure and the sedation                            options and risks were discussed with the patient.                            All questions were answered, and informed consent                            was obtained. Prior Anticoagulants: The patient has                            taken no anticoagulant or antiplatelet agents. ASA                            Grade Assessment: III - A patient with severe                            systemic disease. After reviewing the risks and                            benefits, the patient was deemed in satisfactory                            condition to undergo the procedure.  After obtaining informed consent, the colonoscope                            was passed under direct vision. Throughout the                            procedure, the patient's blood pressure, pulse, and                            oxygen saturations were monitored continuously. The                            CF [id number] #[id number] was introduced through the anus                            and advanced to  the the terminal ileum, with                            identification of the appendiceal orifice and IC                            valve. The colonoscopy was performed without                            difficulty. The patient tolerated the procedure                            well. The quality of the bowel preparation was                            good. The terminal ileum, ileocecal valve,                            appendiceal orifice, and rectum were photographed.                            The bowel preparation used was SUPREP via split                            dose instruction. Scope In: 11:01:07 AM Scope Out: 11:18:22 AM Scope Withdrawal Time: 0 hours 12 minutes 35 seconds  Total Procedure Duration: 0 hours 17 minutes 15 seconds  Findings:                 Skin tags were found on perianal exam.                           The digital rectal exam was normal. Pertinent                            negatives include normal sphincter tone and no                            palpable rectal lesions.  A 2 mm polyp was found in the cecum. The polyp was                            sessile. The polyp was removed with a cold snare.                            Resection and retrieval were complete. Estimated                            blood loss was minimal.                           A 2 mm polyp was found in the ascending colon. The                            polyp was sessile. The polyp was removed with a                            cold snare. Resection and retrieval were complete.                            Estimated blood loss was minimal.                           A 5 mm polyp was found in the transverse colon. The                            polyp was sessile. The polyp was removed with a                            cold snare. Resection and retrieval were complete.                            Estimated blood loss was minimal.                           The exam was  otherwise normal throughout the                            examined colon.                           The terminal ileum appeared normal.                           Non-bleeding internal hemorrhoids were found during                            retroflexion. The hemorrhoids were Grade I                            (internal hemorrhoids that do not prolapse).  No additional abnormalities were found on                            retroflexion. Complications:            No immediate complications. Estimated Blood Loss:     Estimated blood loss was minimal. Impression:               - Perianal skin tags found on perianal exam.                           - One 2 mm polyp in the cecum, removed with a cold                            snare. Resected and retrieved.                           - One 2 mm polyp in the ascending colon, removed                            with a cold snare. Resected and retrieved.                           - One 5 mm polyp in the transverse colon, removed                            with a cold snare. Resected and retrieved.                           - The examined portion of the ileum was normal.                           - Non-bleeding internal hemorrhoids. Recommendation:           - Patient has a contact number available for                            emergencies. The signs and symptoms of potential                            delayed complications were discussed with the                            patient. Return to normal activities tomorrow.                            Written discharge instructions were provided to the                            patient.                           - Resume previous diet.                           - Continue present medications.                           -  Await pathology results.                           - Repeat colonoscopy (date not yet determined) for                            surveillance based on pathology  results. Gopal Malter E. Tomasa Rand, MD 04/20/2023 11:23:27 AM This report has been signed electronically.

## 2023-04-21 ENCOUNTER — Telehealth: Payer: Self-pay | Admitting: *Deleted

## 2023-04-21 NOTE — Telephone Encounter (Signed)
  Follow up Call-     04/20/2023   10:07 AM  Call back number  Post procedure Call Back phone  # [phone]  Permission to leave phone message Yes     Patient questions:  Do you have a fever, pain , or abdominal swelling? No. Pain Score  0 *  Have you tolerated food without any problems? Yes.    Have you been able to return to your normal activities? Yes.    Do you have any questions about your discharge instructions: Diet   No. Medications  No. Follow up visit  No.  Do you have questions or concerns about your Care? No.  Actions: * If pain score is 4 or above: No action needed, pain <4.

## 2023-04-26 NOTE — Progress Notes (Signed)
Benjamin Robbins,   The three polyps that I removed during your recent procedure were completely benign but were proven to be "pre-cancerous" polyps that MAY have grown into cancers if they had not been removed.  Studies shows that at least 20% of women over age 67 and 30% of men over age 61 have pre-cancerous polyps.  Based on current nationally recognized surveillance guidelines, I recommend that you have a repeat colonoscopy in 5 years.   If you develop any new rectal bleeding, abdominal pain or significant bowel habit changes, please contact me before then.

## 2023-05-04 ENCOUNTER — Other Ambulatory Visit (HOSPITAL_COMMUNITY): Payer: Self-pay

## 2023-05-12 ENCOUNTER — Other Ambulatory Visit: Payer: Self-pay

## 2023-05-27 ENCOUNTER — Other Ambulatory Visit (HOSPITAL_COMMUNITY): Payer: Self-pay

## 2023-05-27 MED ORDER — LISINOPRIL 10 MG PO TABS
10.0000 mg | ORAL_TABLET | Freq: Every day | ORAL | 1 refills | Status: AC
  Filled 2023-05-27: qty 90, 90d supply, fill #0

## 2023-05-31 ENCOUNTER — Other Ambulatory Visit (HOSPITAL_COMMUNITY): Payer: Self-pay

## 2023-06-02 ENCOUNTER — Other Ambulatory Visit (HOSPITAL_COMMUNITY): Payer: Self-pay

## 2023-08-11 ENCOUNTER — Telehealth: Payer: Self-pay | Admitting: Cardiovascular Disease

## 2023-08-11 MED ORDER — ATORVASTATIN CALCIUM 80 MG PO TABS: ORAL_TABLET | ORAL | 1 refills | Status: DC

## 2023-08-11 NOTE — Telephone Encounter (Signed)
Refill for Atorvastatin has been sent to [facility], [place].

## 2023-08-11 NOTE — Telephone Encounter (Signed)
New Message:     Patient says he has moved to [place] and need to change his pharmacy. His new pharmacy is  [facility]     [address] [place], [place], [place] [address]  Phone Number is [phone]     *STAT* If patient is at the pharmacy, call can be transferred to refill team.   1. Which medications need to be refilled? (please list name of each medication and dose if known) new prescription for Atorvastatin    2. Would you like to learn more about the convenience, safety, & potential cost savings by using the [facility]?      3. Are you open to using the [facility] (Type [facility].    4. Which pharmacy/location (including street and city if local pharmacy) is medication to be sent to?  [facility]  [address], [place], [place]   5. Do they need a 30 day or 90 day supply? 90 days and refills

## 2023-09-13 ENCOUNTER — Telehealth: Payer: Self-pay | Admitting: Cardiovascular Disease

## 2023-09-13 NOTE — Telephone Encounter (Signed)
*  STAT* If patient is at the pharmacy, call can be transferred to refill team.   1. Which medications need to be refilled? (please list name of each medication and dose if known) carvedilol (COREG) 25 MG tablet   2. Which pharmacy/location (including street and city if local pharmacy) is medication to be sent to? [id number] - [place], [place] - [address]   3. Do they need a 30 day or 90 day supply? 90

## 2023-09-15 MED ORDER — CARVEDILOL 25 MG PO TABS: 25.0000 mg | ORAL_TABLET | Freq: Two times a day (BID) | ORAL | 3 refills | Status: DC

## 2024-01-28 ENCOUNTER — Other Ambulatory Visit: Payer: Self-pay | Admitting: Cardiovascular Disease

## 2024-03-27 ENCOUNTER — Other Ambulatory Visit: Payer: Self-pay | Admitting: Cardiovascular Disease

## 2024-03-30 ENCOUNTER — Other Ambulatory Visit: Payer: Self-pay

## 2024-03-30 MED ORDER — ATORVASTATIN CALCIUM 80 MG PO TABS: 80.0000 mg | ORAL_TABLET | Freq: Every day | ORAL | 0 refills | Status: AC

## 2024-04-07 ENCOUNTER — Encounter: Payer: Self-pay | Admitting: Cardiovascular Disease

## 2024-04-07 NOTE — Telephone Encounter (Signed)
 Error

## 2024-05-01 NOTE — Progress Notes (Signed)
 Cardiology Office Note    Patient Name: Benjamin Robbins Date of Encounter: 05/01/2024  Primary Care Provider:  Jimmey Mould, MD Primary Cardiologist:  None Primary Electrophysiologist: None   Past Medical History    Past Medical History:  Diagnosis Date   GERD (gastroesophageal reflux disease)    on meds   Hyperlipidemia    on meds   Hypertension    on meds   Migraine    Prostate CA [facility]) 2009    History of Present Illness  Benjamin Robbins is a 68 y.o. male with a PMH of CAD s/p anterior STEMI with multivessel disease and CABG x 4 on 06/25/2016, ICM, HFrEF, GERD, HTN, HLD, prostate CA who presents today for annual follow-up.  Benjamin Robbins was seen initially on 06/25/2016 when he presented with complaint of substernal chest pain and was found to have anterior STEMI with LHC completed showing three-vessel disease.  He underwent emergent CABG x 4 by Dr. Sherene Dilling with LIMA-LAD, SVG-OM, SVG-PD/PD/PL. he underwent 2D echo that showed reduced EF of 25% preop and postop 2D echo showed EF of 30-35%.  He was started on GDMT with carvedilol  and lisinopril  and underwent subsequent 2D echo with EF increasing to 40-45%.  He was last seen by Dr. Katheryne Pane on 01/20/2023 and reported doing well with complaints of subtle shortness of breath on exertion but no complaints of chest pain.  He underwent a 2D echo on 01/28/2021 with EF of 45 to 30% and grade 2 DD with mild MR and trivial AVR and hypokinesis in mid apical anterior septum  Benjamin Robbins presents today with his wife for annual follow-up.He is temporarily living in [place]  and has a primary care doctor there, Dr. Sharia Daunt at [facility] in [place], [place] . He experiences palpitations, described as feeling 'something that feels like abnormal' in his heart. These sensations have become familiar and do not significantly bother him. No strong heartbeat, dizziness, or progressive fatigue is noted. He experiences fatigue, particularly during outdoor  activities, especially in the heat. This fatigue is not new and has been present since his last visit. He also reports subtle shortness of breath with exertion, which has remained unchanged since his last evaluation. His past medical history includes a myocardial infarction and coronary artery disease, for which he underwent a quadruple bypass surgery. His ejection fraction had previously decreased to as low as 25% and was measured at 45-50% on his most recent echocardiogram. He is currently on medications to support heart function. No recent changes in diet, illness, or symptoms such as diarrhea, nausea, or vomiting. His cholesterol levels were stable as of March last year, with an LDL within normal limits. His potassium level was slightly elevated at 5.3 mmol/L, which is on the high end of normal. He is not aware of any thyroid issues and is unsure if it has been checked recently. Patient denies chest pain, palpitations, dyspnea, PND, orthopnea, nausea, vomiting, dizziness, syncope, edema, weight gain, or early satiety.  Discussed the use of AI scribe software for clinical note transcription with the patient, who gave verbal consent to proceed.  History of Present Illness   Review of Systems  Please see the history of present illness.    All other systems reviewed and are otherwise negative except as noted above.  Physical Exam     Wt Readings from Last 3 Encounters:  04/20/23 172 lb (78 kg)  03/19/23 172 lb (78 kg)  01/20/23 183 lb 12.8 oz (83.4 kg)  WJ:XBJYN were no vitals filed for this visit.,There is no height or weight on file to calculate BMI. GEN: Well nourished, well developed in no acute distress Neck: No JVD; No carotid bruits Pulmonary: Clear to auscultation without rales, wheezing or rhonchi  Cardiovascular: Normal rate. Regular rhythm. Normal S1. Normal S2.   Murmurs: There is no murmur.  ABDOMEN: Soft, non-tender, non-distended EXTREMITIES:  No edema; No deformity    EKG/LABS/ Recent Cardiac Studies   ECG personally reviewed by me today -sinus rhythm with frequent PVCs and rate of 70 bpm with no acute changes since previous EKG.  Risk Assessment/Calculations:     Lab Results  Component Value Date   WBC 10.1 06/30/2016   HGB 9.5 (L) 06/30/2016   HCT 30.7 (L) 06/30/2016   MCV 81.6 06/30/2016   PLT 218 06/30/2016   Lab Results  Component Value Date   CREATININE 0.90 06/28/2016   BUN 9 06/28/2016   NA 138 06/28/2016   K 4.0 06/28/2016   CL 109 06/28/2016   CO2 25 06/28/2016   Lab Results  Component Value Date   CHOL 106 01/09/2021   HDL 41 01/09/2021   LDLCALC 52 01/09/2021   TRIG 54 01/09/2021   CHOLHDL 2.6 01/09/2021    Lab Results  Component Value Date   HGBA1C 5.7 (H) 06/26/2016   Assessment & Plan    Assessment & Plan   1.  Coronary artery disease: -s/p anterior STEMI with multivessel disease and CABG x 4 on 06/25/2016 - Today patient endorsed no chest pain or angina since previous follow-up. - Continue current GDMT with Coreg  25 mg twice daily, Lipitor  80 mg daily, ASA 81 mg daily  2.  HFrEF/ischemic cardiomyopathy: - Patient's last 2D echo completed in 2022 showing EF of 40-45% - Order echocardiogram to assess current ejection fraction and heart structure. - Monitor blood pressure and heart rate. - Schedule echocardiogram for December or earlier if possible. - Continue Coreg  25 mg twice daily and lisinopril  10 mg daily  3.  Essential hypertension: - Patient's blood pressure today was stable at 124/80 - Continue lisinopril  10 mg daily and carvedilol  25 mg twice daily  4.  Hyperlipidemia: - Patient's lipids are followed by PCP in [place]  - We will obtain copy and patient advised to continue Lipitor  80 mg daily  5. Premature Ventricular Contractions (PVCs): EKG shows PVCs in a bigeminal pattern without significant symptoms. Potential causes include electrolyte imbalances or structural heart changes. Previous EKG  showed regular sinus rhythm, indicating a change. - Order blood tests for electrolytes, including potassium, magnesium , and calcium . - Consider event monitor if PVCs increase in frequency or become symptomatic. - Order echocardiogram to assess heart structure and ejection fraction. - Check thyroid function.    Disposition: Follow-up with None or APP in 12 months    Signed, Francene Ing, Retha Cast, NP 05/01/2024, 1:11 PM [facility]

## 2024-05-02 ENCOUNTER — Encounter: Payer: Self-pay | Admitting: Nurse Practitioner

## 2024-05-02 ENCOUNTER — Ambulatory Visit: Payer: Self-pay | Attending: Nurse Practitioner | Admitting: Nurse Practitioner

## 2024-05-02 VITALS — BP 124/80 | HR 62 | Ht 72.0 in | Wt 180.0 lb

## 2024-05-02 DIAGNOSIS — E785 Hyperlipidemia, unspecified: Secondary | ICD-10-CM | POA: Diagnosis not present

## 2024-05-02 DIAGNOSIS — I10 Essential (primary) hypertension: Secondary | ICD-10-CM | POA: Diagnosis not present

## 2024-05-02 DIAGNOSIS — I255 Ischemic cardiomyopathy: Secondary | ICD-10-CM | POA: Diagnosis not present

## 2024-05-02 DIAGNOSIS — I25810 Atherosclerosis of coronary artery bypass graft(s) without angina pectoris: Secondary | ICD-10-CM | POA: Diagnosis not present

## 2024-05-02 NOTE — Patient Instructions (Addendum)
 Medication Instructions:  Your physician recommends that you continue on your current medications as directed. Please refer to the Current Medication list given to you today. *If you need a refill on your cardiac medications before your next appointment, please call your pharmacy*  Lab Work: TODAY-BMET, CBC, TSH, MAG, FREET4 If you have labs (blood work) drawn today and your tests are completely normal, you will receive your results only by: MyChart Message (if you have MyChart) OR A paper copy in the mail If you have any lab test that is abnormal or we need to change your treatment, we will call you to review the results.  Testing/Procedures: Your physician has requested that you have an echocardiogram. Echocardiography is a painless test that uses sound waves to create images of your heart. It provides your doctor with information about the size and shape of your heart and how well your heart's chambers and valves are working. This procedure takes approximately one hour. There are no restrictions for this procedure. Please do NOT wear cologne, perfume, aftershave, or lotions (deodorant is allowed). Please arrive 15 minutes prior to your appointment time.  Please note: We ask at that you not bring children with you during ultrasound (echo/ vascular) testing. Due to room size and safety concerns, children are not allowed in the ultrasound rooms during exams. Our front office staff cannot provide observation of children in our lobby area while testing is being conducted. An adult accompanying a patient to their appointment will only be allowed in the ultrasound room at the discretion of the ultrasound technician under special circumstances. We apologize for any inconvenience.  Follow-Up: At [facility], you and your health needs are our priority.  As part of our continuing mission to provide you with exceptional heart care, our providers are all part of one team.  This team includes your  primary Cardiologist (physician) and Advanced Practice Providers or APPs (Physician Assistants and Nurse Practitioners) who all work together to provide you with the care you need, when you need it.  Your next appointment:   12 month(s)  Provider:   Lauro Portal, MD   We recommend signing up for the patient portal called "MyChart".  Sign up information is provided on this After Visit Summary.  MyChart is used to connect with patients for Virtual Visits (Telemedicine).  Patients are able to view lab/test results, encounter notes, upcoming appointments, etc.  Non-urgent messages can be sent to your provider as well.   To learn more about what you can do with MyChart, go to [email or web address].   Other Instructions

## 2024-05-03 ENCOUNTER — Ambulatory Visit: Payer: Self-pay | Admitting: Nurse Practitioner

## 2024-05-03 ENCOUNTER — Telehealth: Payer: Self-pay

## 2024-05-03 LAB — BASIC METABOLIC PANEL WITH GFR
BUN/Creatinine Ratio: 10 (ref 10–24)
BUN: 13 mg/dL (ref 8–27)
CO2: 20 mmol/L (ref 20–29)
Calcium: 9.6 mg/dL (ref 8.6–10.2)
Chloride: 108 mmol/L — ABNORMAL HIGH (ref 96–106)
Creatinine, Ser: 1.31 mg/dL — ABNORMAL HIGH (ref 0.76–1.27)
Glucose: 93 mg/dL (ref 70–99)
Potassium: 4.5 mmol/L (ref 3.5–5.2)
Sodium: 142 mmol/L (ref 134–144)
eGFR: 59 mL/min/{1.73_m2} — ABNORMAL LOW (ref >59)

## 2024-05-03 LAB — CBC
Hematocrit: 42.3 % (ref 37.5–51.0)
Hemoglobin: 14 g/dL (ref 13.0–17.7)
MCH: 29.7 pg (ref 26.6–33.0)
MCHC: 33.1 g/dL (ref 31.5–35.7)
MCV: 90 fL (ref 79–97)
Platelets: 178 10*3/uL (ref 150–450)
RBC: 4.72 x10E6/uL (ref 4.14–5.80)
RDW: 12.8 % (ref 11.6–15.4)
WBC: 8.1 10*3/uL (ref 3.4–10.8)

## 2024-05-03 LAB — MAGNESIUM: Magnesium: 2.3 mg/dL (ref 1.6–2.3)

## 2024-05-03 LAB — TSH: TSH: 4.45 u[IU]/mL (ref 0.450–4.500)

## 2024-05-03 LAB — T4, FREE: Free T4: 0.92 ng/dL (ref 0.82–1.77)

## 2024-05-03 NOTE — Telephone Encounter (Signed)
-----   Message from Francene Ing, Retha Cast sent at 05/02/2024  5:19 PM EDT ----- Charlee Conine,   Please contact patient's new PCP for copy of latest labs.  Dr. Sharia Daunt at [facility] in [place], [place]  [phone]  Thanks

## 2024-05-03 NOTE — Telephone Encounter (Signed)
 Left message for the medical records dept to send over a copy of the patients most recent lab work that has been completed.

## 2024-05-08 ENCOUNTER — Telehealth: Payer: Self-pay | Admitting: Nurse Practitioner

## 2024-05-08 NOTE — Telephone Encounter (Signed)
 Pt called in back in stating he saw Benjamin Carry, NP last week and he told him that he lives is [place] now. Benjamin Blacksmith, NP wanted to do an echo and he is requesting echo order be sent to his new doctor down there. He stated this is his new PCP, does not have Cardiologist yet.   Dr. Sharia Daunt at [facility] in [place], [place]  Phone number: [phone]

## 2024-05-10 NOTE — Telephone Encounter (Signed)
Left patient a detailed message, ok per DPR, with [employer] recommendations. Advised to call back with any questions or concerns.

## 2024-05-15 ENCOUNTER — Other Ambulatory Visit (HOSPITAL_COMMUNITY)

## 2024-06-06 LAB — LAB REPORT - SCANNED: EGFR (Non-African Amer.): 88

## 2024-09-25 ENCOUNTER — Other Ambulatory Visit: Payer: Self-pay | Admitting: Cardiovascular Disease
